# Patient Record
Sex: Female | Born: 1950 | Race: White | Hispanic: No | Marital: Married | Smoking: Never smoker
Health system: Southern US, Community
[De-identification: ages and names within clinical notes are randomized; demographics above are authoritative.]

## PROBLEM LIST (undated history)

## (undated) DIAGNOSIS — F329 Major depressive disorder, single episode, unspecified: Secondary | ICD-10-CM

## (undated) DIAGNOSIS — M7580 Other shoulder lesions, unspecified shoulder: Secondary | ICD-10-CM

## (undated) DIAGNOSIS — F419 Anxiety disorder, unspecified: Secondary | ICD-10-CM

## (undated) DIAGNOSIS — F32A Depression, unspecified: Secondary | ICD-10-CM

## (undated) DIAGNOSIS — M1990 Unspecified osteoarthritis, unspecified site: Secondary | ICD-10-CM

## (undated) DIAGNOSIS — Z961 Presence of intraocular lens: Secondary | ICD-10-CM

## (undated) DIAGNOSIS — I10 Essential (primary) hypertension: Secondary | ICD-10-CM

## (undated) DIAGNOSIS — Z862 Personal history of diseases of the blood and blood-forming organs and certain disorders involving the immune mechanism: Secondary | ICD-10-CM

## (undated) DIAGNOSIS — T7840XA Allergy, unspecified, initial encounter: Secondary | ICD-10-CM

## (undated) DIAGNOSIS — IMO0002 Reserved for concepts with insufficient information to code with codable children: Secondary | ICD-10-CM

## (undated) DIAGNOSIS — E785 Hyperlipidemia, unspecified: Secondary | ICD-10-CM

## (undated) DIAGNOSIS — K219 Gastro-esophageal reflux disease without esophagitis: Secondary | ICD-10-CM

## (undated) DIAGNOSIS — Z8742 Personal history of other diseases of the female genital tract: Secondary | ICD-10-CM

## (undated) DIAGNOSIS — D3192 Benign neoplasm of unspecified part of left eye: Secondary | ICD-10-CM

## (undated) DIAGNOSIS — D649 Anemia, unspecified: Secondary | ICD-10-CM

## (undated) DIAGNOSIS — E049 Nontoxic goiter, unspecified: Secondary | ICD-10-CM

## (undated) DIAGNOSIS — E282 Polycystic ovarian syndrome: Secondary | ICD-10-CM

## (undated) HISTORY — PX: COLONOSCOPY: SHX174

## (undated) HISTORY — DX: Personal history of diseases of the blood and blood-forming organs and certain disorders involving the immune mechanism: Z86.2

## (undated) HISTORY — PX: CATARACT EXTRACTION: SUR2

## (undated) HISTORY — DX: Hyperlipidemia, unspecified: E78.5

## (undated) HISTORY — DX: Polycystic ovarian syndrome: E28.2

## (undated) HISTORY — DX: Benign neoplasm of unspecified part of left eye: D31.92

## (undated) HISTORY — DX: Unspecified osteoarthritis, unspecified site: M19.90

## (undated) HISTORY — DX: Anemia, unspecified: D64.9

## (undated) HISTORY — DX: Personal history of other diseases of the female genital tract: Z87.42

## (undated) HISTORY — DX: Presence of intraocular lens: Z96.1

## (undated) HISTORY — DX: Major depressive disorder, single episode, unspecified: F32.9

## (undated) HISTORY — DX: Reserved for concepts with insufficient information to code with codable children: IMO0002

## (undated) HISTORY — DX: Depression, unspecified: F32.A

## (undated) HISTORY — DX: Allergy, unspecified, initial encounter: T78.40XA

## (undated) HISTORY — DX: Nontoxic goiter, unspecified: E04.9

## (undated) HISTORY — DX: Essential (primary) hypertension: I10

## (undated) HISTORY — DX: Gastro-esophageal reflux disease without esophagitis: K21.9

## (undated) HISTORY — DX: Other shoulder lesions, unspecified shoulder: M75.80

## (undated) HISTORY — DX: Anxiety disorder, unspecified: F41.9

---
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
## 1993-03-06 HISTORY — PX: TUBAL LIGATION: SHX77

## 2001-06-17 ENCOUNTER — Other Ambulatory Visit: Admission: RE | Admit: 2001-06-17 | Discharge: 2001-06-17 | Payer: Self-pay | Admitting: Obstetrics and Gynecology

## 2001-06-26 ENCOUNTER — Ambulatory Visit (HOSPITAL_COMMUNITY): Admission: RE | Admit: 2001-06-26 | Discharge: 2001-06-26 | Payer: Self-pay | Admitting: Obstetrics and Gynecology

## 2001-06-26 ENCOUNTER — Encounter: Payer: Self-pay | Admitting: Obstetrics and Gynecology

## 2002-07-18 ENCOUNTER — Other Ambulatory Visit: Admission: RE | Admit: 2002-07-18 | Discharge: 2002-07-18 | Payer: Self-pay | Admitting: Obstetrics and Gynecology

## 2002-07-30 ENCOUNTER — Ambulatory Visit (HOSPITAL_COMMUNITY): Admission: RE | Admit: 2002-07-30 | Discharge: 2002-07-30 | Payer: Self-pay | Admitting: Obstetrics and Gynecology

## 2002-07-30 ENCOUNTER — Encounter: Payer: Self-pay | Admitting: Obstetrics and Gynecology

## 2003-08-20 ENCOUNTER — Other Ambulatory Visit: Admission: RE | Admit: 2003-08-20 | Discharge: 2003-08-20 | Payer: Self-pay | Admitting: Obstetrics and Gynecology

## 2003-08-26 ENCOUNTER — Ambulatory Visit (HOSPITAL_COMMUNITY): Admission: RE | Admit: 2003-08-26 | Discharge: 2003-08-26 | Payer: Self-pay | Admitting: Obstetrics and Gynecology

## 2004-03-06 HISTORY — PX: TOE SURGERY: SHX1073

## 2004-03-06 HISTORY — PX: BUNIONECTOMY: SHX129

## 2004-03-06 LAB — HM COLONOSCOPY

## 2004-09-08 ENCOUNTER — Other Ambulatory Visit: Admission: RE | Admit: 2004-09-08 | Discharge: 2004-09-08 | Payer: Self-pay | Admitting: *Deleted

## 2004-09-23 ENCOUNTER — Ambulatory Visit (HOSPITAL_COMMUNITY): Admission: RE | Admit: 2004-09-23 | Discharge: 2004-09-23 | Payer: Self-pay | Admitting: Obstetrics and Gynecology

## 2004-10-12 ENCOUNTER — Ambulatory Visit: Payer: Self-pay | Admitting: Gastroenterology

## 2004-10-28 ENCOUNTER — Ambulatory Visit: Payer: Self-pay | Admitting: Gastroenterology

## 2005-02-16 ENCOUNTER — Ambulatory Visit: Payer: Self-pay

## 2005-10-24 ENCOUNTER — Ambulatory Visit (HOSPITAL_COMMUNITY): Admission: RE | Admit: 2005-10-24 | Discharge: 2005-10-24 | Payer: Self-pay | Admitting: Internal Medicine

## 2005-11-02 ENCOUNTER — Other Ambulatory Visit: Admission: RE | Admit: 2005-11-02 | Discharge: 2005-11-02 | Payer: Self-pay | Admitting: Obstetrics & Gynecology

## 2005-11-14 ENCOUNTER — Ambulatory Visit (HOSPITAL_COMMUNITY): Admission: RE | Admit: 2005-11-14 | Discharge: 2005-11-14 | Payer: Self-pay | Admitting: Obstetrics & Gynecology

## 2006-03-15 ENCOUNTER — Other Ambulatory Visit: Admission: RE | Admit: 2006-03-15 | Discharge: 2006-03-15 | Payer: Self-pay | Admitting: Obstetrics & Gynecology

## 2006-11-16 ENCOUNTER — Ambulatory Visit (HOSPITAL_COMMUNITY): Admission: RE | Admit: 2006-11-16 | Discharge: 2006-11-16 | Payer: Self-pay | Admitting: Obstetrics & Gynecology

## 2006-12-03 ENCOUNTER — Other Ambulatory Visit: Admission: RE | Admit: 2006-12-03 | Discharge: 2006-12-03 | Payer: Self-pay | Admitting: Obstetrics & Gynecology

## 2007-03-07 LAB — HM DEXA SCAN

## 2007-05-27 ENCOUNTER — Ambulatory Visit (HOSPITAL_COMMUNITY): Admission: RE | Admit: 2007-05-27 | Discharge: 2007-05-27 | Payer: Self-pay | Admitting: Internal Medicine

## 2007-05-27 ENCOUNTER — Ambulatory Visit: Payer: Self-pay | Admitting: Vascular Surgery

## 2007-05-27 ENCOUNTER — Encounter (INDEPENDENT_AMBULATORY_CARE_PROVIDER_SITE_OTHER): Payer: Self-pay | Admitting: Internal Medicine

## 2007-12-10 ENCOUNTER — Ambulatory Visit (HOSPITAL_COMMUNITY): Admission: RE | Admit: 2007-12-10 | Discharge: 2007-12-10 | Payer: Self-pay | Admitting: Obstetrics & Gynecology

## 2007-12-17 ENCOUNTER — Ambulatory Visit (HOSPITAL_COMMUNITY): Admission: RE | Admit: 2007-12-17 | Discharge: 2007-12-17 | Payer: Self-pay | Admitting: Internal Medicine

## 2008-01-23 ENCOUNTER — Other Ambulatory Visit: Admission: RE | Admit: 2008-01-23 | Discharge: 2008-01-23 | Payer: Self-pay | Admitting: Obstetrics & Gynecology

## 2008-04-29 ENCOUNTER — Ambulatory Visit: Payer: Self-pay | Admitting: Cardiology

## 2008-05-01 ENCOUNTER — Ambulatory Visit: Payer: Self-pay

## 2008-05-22 ENCOUNTER — Encounter: Payer: Self-pay | Admitting: Cardiology

## 2008-05-22 ENCOUNTER — Ambulatory Visit: Payer: Self-pay | Admitting: Cardiology

## 2008-12-10 ENCOUNTER — Ambulatory Visit (HOSPITAL_COMMUNITY): Admission: RE | Admit: 2008-12-10 | Discharge: 2008-12-10 | Payer: Self-pay | Admitting: Obstetrics & Gynecology

## 2009-06-18 ENCOUNTER — Ambulatory Visit (HOSPITAL_COMMUNITY): Admission: RE | Admit: 2009-06-18 | Discharge: 2009-06-18 | Payer: Self-pay | Admitting: Internal Medicine

## 2009-09-10 ENCOUNTER — Encounter: Admission: RE | Admit: 2009-09-10 | Discharge: 2009-09-10 | Payer: Self-pay | Admitting: Internal Medicine

## 2009-12-13 ENCOUNTER — Ambulatory Visit (HOSPITAL_COMMUNITY): Admission: RE | Admit: 2009-12-13 | Discharge: 2009-12-13 | Payer: Self-pay | Admitting: Obstetrics & Gynecology

## 2010-03-27 ENCOUNTER — Encounter: Payer: Self-pay | Admitting: Internal Medicine

## 2010-04-05 NOTE — Assessment & Plan Note (Signed)
   Visit Type:  Follow-up Primary Provider:  Marisue Brooklyn  CC:  no cardiac concerns.  History of Present Illness: See the dictated note from today's visit  Current Medications (verified): 1)  Eql Fish Oil 1000 Mg Caps (Omega-3 Fatty Acids) .... Two Times A Day 2)  Vitamin D 2000 Unit  Tabs (Cholecalciferol) .... 3 Daily 3)  Triple Flex .... Two Times A Day 4)  Calcium D .... Two Times A Day 5)  Cvs Lysine 1000 Mg Tabs (Lysine) .... Once Daily 6)  Crestor 10 Mg Tabs (Rosuvastatin Calcium) .Marland Kitchen.. 1 M,w,f 7)  Lexapro 10 Mg Tabs (Escitalopram Oxalate) .... Once Daily 8)  Benicar 20 Mg Tabs (Olmesartan Medoxomil) .... Take One Tablet By Mouth Daily 9)  Prilosec Otc 20 Mg Tbec (Omeprazole Magnesium) .... Once Daily 10)  Zyrtec Allergy 10 Mg Tabs (Cetirizine Hcl) .... Prn  Allergies (verified): No Known Drug Allergies  Vital Signs:  Patient profile:   60 year old female Height:      66 inches Weight:      164 pounds BMI:     26.57 Pulse rate:   72 / minute BP sitting:   130 / 82

## 2010-07-19 NOTE — Assessment & Plan Note (Signed)
[facility]                            CARDIOLOGY OFFICE NOTE   Christine, [name]                     MRN:          [id number]  DATE:04/29/2008                            DOB:          10-06-1950    [name] is here for the evaluation of chest pain.  She is a very  pleasant active 60 year old female.  She has a strong family history of  coronary disease.  Recently she noticed a type of burning sensation in  her upper chest and in her throat.  It occurred primarily at rest.  However, she was concerned that it may not be indigestion and with her  family history of coronary disease, she explained her situation to Dr.  Elisabeth Most.  An appointment was made to see me for cardiology evaluation.  With the discomfort she does not have nausea, vomiting or diaphoresis.  There is no shortness of breath.  She says that there is a continued  vague dull sensation that she continues to feel.  There is no radiation.   PAST MEDICAL HISTORY:   ALLERGIES:  BACTRIM AND MONISTAT.   MEDICATIONS:  Omega-3 fish oil, vitamins, Triple Flex, calcium, aspirin  81, lysine.  Crestor 20 on Monday, Wednesday, and Friday.  Lexapro 15 at  night.  Benicar 20 and over-the-counter Prilosec that she has been  taking on her own recently.   OTHER MEDICAL PROBLEMS:  See the list below.   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  She does not smoke and has never  smoked.   FAMILY HISTORY:  The patient's father is deceased.  He had significant  coronary disease.  She has a 73 year old brother who recently had a  coronary procedure (probably a stent).   REVIEW OF SYSTEMS:  She is not having any GU symptoms at this time.  She  has no fevers or chills.  There are no skin rashes.  She has had some  vague headaches.  She has no dental problems.  There  is no shortness of  breath.  Otherwise review of systems is negative.   PHYSICAL EXAM:  Blood pressure is 132/74.  Her pulse is 82.  Weight is  164 pounds.  The patient is oriented to person, time and place.  Affect  is normal.  HEENT:  Reveals no xanthelasma.  She has normal extraocular motion.  There are no carotid bruits.  There is no jugular venous distention.  Lungs are clear.  Respiratory effort is not labored.  CARDIAC:  Exam reveals an S1 with an S2.  There are no clicks or  significant murmurs.  The abdomen is soft.  She has no significant peripheral edema.   EKG is normal.   PROBLEMS:  Include:  1. A history of tubal ligation.  2. A history of cataract surgery.  3. A history of some menstrual dysfunction historically.  4. A strong family history of coronary artery disease.  5. Hypercholesterolemia.  6. Hypertension.  7. Possible gastroesophageal reflux disease.  8. Recent* chest discomfort of unknown etiology.  At this point there      is no definite evidence of significant ischemic disease.  However,      with her symptoms and her strong family history, it is appropriate      to proceed with a stress Myoview scan.  This will be arranged and      we will give her early feedback on the results and then I will see      her back for cardiology followup.   During evaluation she asked me specifically about Benicar and its  potential side effects.  We discussed this and I told her I felt very  comfortable that this was an excellent medicine for her.   I will see her back for followup after her study.     Luis Abed, MD, [facility]  Electronically Signed    JDK/MedQ  DD: 04/29/2008  DT: 04/29/2008  Job #: [id number]   cc:   Lovenia Kim, D.O.

## 2010-07-19 NOTE — Assessment & Plan Note (Signed)
[facility]                            CARDIOLOGY OFFICE NOTE   RAEANN, OFFNER                     MRN:          [id number]  DATE:05/22/2008                            DOB:          May 03, 1950    Ms. Mcqueen is seen for followup.  I had seen her in consultation on  April 29, 2008.  We did a stress Myoview scan.  She exercised well.  There was no chest pain.  There was no EKG change.  The wall motion was  normal.  There was no sign of ischemia.   In seeing her back and talking with her today she clearly has  significant symptoms of GERD.  I encouraged her to continue to take her  Prilosec and to increase it to twice a day.  I encouraged her to elevate  the head of her bed and to be careful about not lying down after she  eats.  Also some weight loss will be helpful.  She will follow up with  Dr. Elisabeth Most.  No further cardiac workup is needed.   PAST MEDICAL HISTORY:  Allergies to BACTRIM and MONISTAT.   MEDICATIONS:  See the prior note.   OTHER MEDICAL PROBLEMS:  See the complete list on my note of April 29, 2008.   REVIEW OF SYSTEMS:  She is not having any headache.  There are no fevers  or chills.  There are no major musculoskeletal problems.  As mentioned  in the HPI, GERD appears to be a significant issue.  She is not having  any GU problems.  Otherwise, review of systems is negative.   Physical examination today was limited.  The majority of the visit was  discussing her overall status.  Blood pressure is 130/82, her weight is  164 pounds.   Problems are listed on the note of April 29, 2008.  With the family  history of coronary disease of course careful attention will have to be  paid to her lipids over time and very good primary prevention.  I do not  believe her current symptoms are cardiac.  Further cardiac workup is not  needed at this time.     Luis Abed, MD, [facility]  Electronically Signed    JDK/MedQ   DD: 05/22/2008  DT: 05/23/2008  Job #: [id number]   cc:   Lovenia Kim, D.O.

## 2010-11-15 ENCOUNTER — Other Ambulatory Visit: Payer: Self-pay | Admitting: Obstetrics & Gynecology

## 2010-11-15 DIAGNOSIS — Z1231 Encounter for screening mammogram for malignant neoplasm of breast: Secondary | ICD-10-CM

## 2010-12-15 ENCOUNTER — Ambulatory Visit (HOSPITAL_COMMUNITY)
Admission: RE | Admit: 2010-12-15 | Discharge: 2010-12-15 | Disposition: A | Discharge status: Home / Self Care | Payer: 59 | Source: Ambulatory Visit | Attending: Obstetrics & Gynecology | Admitting: Obstetrics & Gynecology

## 2010-12-15 DIAGNOSIS — Z1231 Encounter for screening mammogram for malignant neoplasm of breast: Secondary | ICD-10-CM

## 2011-04-27 LAB — HM PAP SMEAR: HM Pap smear: NEGATIVE

## 2011-06-13 DIAGNOSIS — Z961 Presence of intraocular lens: Secondary | ICD-10-CM | POA: Insufficient documentation

## 2011-06-13 DIAGNOSIS — IMO0002 Reserved for concepts with insufficient information to code with codable children: Secondary | ICD-10-CM

## 2011-06-13 HISTORY — DX: Reserved for concepts with insufficient information to code with codable children: IMO0002

## 2011-06-13 HISTORY — DX: Presence of intraocular lens: Z96.1

## 2011-11-14 ENCOUNTER — Other Ambulatory Visit: Payer: Self-pay | Admitting: Obstetrics & Gynecology

## 2011-11-14 DIAGNOSIS — Z1231 Encounter for screening mammogram for malignant neoplasm of breast: Secondary | ICD-10-CM

## 2011-12-13 ENCOUNTER — Other Ambulatory Visit: Payer: Self-pay | Admitting: Internal Medicine

## 2011-12-13 DIAGNOSIS — R221 Localized swelling, mass and lump, neck: Secondary | ICD-10-CM

## 2011-12-18 ENCOUNTER — Ambulatory Visit
Admission: RE | Admit: 2011-12-18 | Discharge: 2011-12-18 | Disposition: A | Discharge status: Home / Self Care | Payer: 59 | Source: Ambulatory Visit | Attending: Internal Medicine | Admitting: Internal Medicine

## 2011-12-18 DIAGNOSIS — R221 Localized swelling, mass and lump, neck: Secondary | ICD-10-CM

## 2011-12-28 ENCOUNTER — Ambulatory Visit (HOSPITAL_COMMUNITY)
Admission: RE | Admit: 2011-12-28 | Discharge: 2011-12-28 | Disposition: A | Discharge status: Home / Self Care | Payer: 59 | Source: Ambulatory Visit | Attending: Obstetrics & Gynecology | Admitting: Obstetrics & Gynecology

## 2011-12-28 DIAGNOSIS — Z1231 Encounter for screening mammogram for malignant neoplasm of breast: Secondary | ICD-10-CM | POA: Insufficient documentation

## 2011-12-28 LAB — HM MAMMOGRAPHY

## 2012-03-26 ENCOUNTER — Encounter: Payer: Self-pay | Admitting: *Deleted

## 2012-05-21 ENCOUNTER — Encounter: Payer: Self-pay | Admitting: Obstetrics & Gynecology

## 2012-05-21 ENCOUNTER — Ambulatory Visit (INDEPENDENT_AMBULATORY_CARE_PROVIDER_SITE_OTHER): Payer: 59 | Admitting: Obstetrics & Gynecology

## 2012-05-21 VITALS — BP 134/82 | Ht 66.0 in | Wt 142.2 lb

## 2012-05-21 DIAGNOSIS — Z01419 Encounter for gynecological examination (general) (routine) without abnormal findings: Secondary | ICD-10-CM

## 2012-05-21 DIAGNOSIS — N952 Postmenopausal atrophic vaginitis: Secondary | ICD-10-CM

## 2012-05-21 MED ORDER — ESTRADIOL 0.1 MG/GM VA CREA: TOPICAL_CREAM | VAGINAL | Status: DC

## 2012-05-21 MED ORDER — ESTRADIOL 10 MCG VA TABS: 1.0000 | ORAL_TABLET | VAGINAL | Status: DC

## 2012-05-21 NOTE — Progress Notes (Signed)
61 y.o. MarriedCaucasian female [phone] here for annual exam.  Reports she's being followed more closely this year by PCP for "pre-diabetes".  HBA1C 5.9.  Also diagnosed with multinodular goiter.  She had an ultrasound and will have follow-up later this year.    Patient's last menstrual period was 12/05/2010.          Sexually active: yes  The current method of family planning is tubal ligation.    Exercising: yes  Gym/ health club routine includes swimming. Hormones:Estrogen: vagifem 10 mcg twice weekly   reports that she has never smoked. She has never used smokeless tobacco. She reports that  drinks alcohol. She reports that she does not use illicit drugs.  Health Maintenance  Topic Date Due  . Influenza Vaccine  11/04/1951  . Pap Smear  12/04/1969  . Tetanus/tdap  12/05/1970  . Colonoscopy  12/04/2001  . Zostavax  12/05/2011  . Mammogram  12/27/2013  last Pap was 2/13 neg and HR HPV was also negative Also started hep A/B series.  Has one left.     Family History  Problem Relation Age of Onset  . Thyroid disease Mother     hyperthyroidism    There is no problem list on file for this patient.   Past Medical History  Diagnosis Date  . Degenerative arthritis   . PCOS (polycystic ovarian syndrome)   . AC (acromioclavicular) joint bone spurs   . Hypertension   . Goiter     multi nodular   . History of PCOS     Past Surgical History  Procedure Laterality Date  . Tubal ligation      Allergies: Bactrim and Monistat  Current Outpatient Prescriptions  Medication Sig Dispense Refill  . amLODipine (NORVASC) 5 MG tablet Take 2.5 mg by mouth daily.       . B Complex Vitamins (VITAMIN B COMPLEX PO) Take by mouth.      . B COMPLEX-BIOTIN-FA PO Take by mouth.      . Cetirizine HCl (ZYRTEC ALLERGY PO) Take by mouth.      . Estradiol (VAGIFEM) 10 MCG TABS Place vaginally 2 (two) times a week.      Marland Kitchen GLUCOSAMINE-CHONDROITIN PO Take by mouth.      . GuaiFENesin (MUCINEX PO)  Take by mouth as needed.      Marland Kitchen MAGNESIUM PO Take by mouth.      . Omega-3 Fatty Acids (FISH OIL PO) Take by mouth.      . Red Yeast Rice Extract (RED YEAST RICE PO) Take by mouth.      . Rizatriptan Benzoate (MAXALT PO) Take by mouth as needed.      . rosuvastatin (CRESTOR) 20 MG tablet Take 20 mg by mouth 3 (three) times a week.      . spironolactone (ALDACTONE) 100 MG tablet Take 200 mg by mouth daily.      . ValACYclovir HCl (VALTREX PO) Take by mouth as needed.      . Vitamin D, Ergocalciferol, (DRISDOL) 50000 UNITS CAPS Take 5,000 Units by mouth daily.        No current facility-administered medications for this visit.    ROS: Pertinent items are noted in HPI.  Exam:    BP 134/82  Ht 5\' 6"  (1.676 m)  Wt 142 lb 3.2 oz (64.501 kg)  BMI 22.96 kg/m2  LMP 12/05/2010  Height:   Height: 5\' 6"  (167.6 cm)   General appearance: alert, cooperative and appears stated age Head: Normocephalic, without  obvious abnormality, atraumatic Neck: no adenopathy, supple, symmetrical, trachea midline and thyroid mildly enlarged, R>L lobe, no palpable nodules Lungs: clear to auscultation bilaterally Breasts: Inspection negative, No nipple retraction or dimpling, No nipple discharge or bleeding, No axillary or supraclavicular adenopathy, Normal to palpation without dominant masses Heart: regular rate and rhythm Abdomen: soft, non-tender; bowel sounds normal; no masses,  no organomegaly Extremities: extremities normal, atraumatic, no cyanosis or edema Skin: Skin color, texture, turgor normal. No rashes or lesions Lymph nodes: Cervical, supraclavicular, and axillary nodes normal. No abnormal inguinal nodes palpated Neurologic: Grossly normal   Pelvic: External genitalia:  no lesions              Urethra:  normal appearing urethra with no masses, tenderness or lesions              Bartholins and Skenes: normal                 Vagina: normal appearing vagina with normal color and discharge, no  lesions              Cervix: normal appearance              Pap taken: no        Bimanual Exam:  Uterus:  uterus is normal size, shape, consistency and nontender                                      Adnexa: normal adnexa in size, nontender and no masses                                      Rectovaginal: Confirms                                      Anus:  normal sphincter tone, no lesions  A: well woman Vaginal atrophic changes PCOS Elevated lipids  P: AEX 1 year Vagifem pv twice weekly.  Mail order RX given.  RX also given for estrace cream Labs with PCP q 4 months She will check with PCP regarding TDAP--if due.       An After Visit Summary was printed and given to the patient.

## 2012-07-05 ENCOUNTER — Other Ambulatory Visit: Payer: Self-pay | Admitting: Internal Medicine

## 2012-07-05 DIAGNOSIS — E042 Nontoxic multinodular goiter: Secondary | ICD-10-CM

## 2012-07-10 ENCOUNTER — Ambulatory Visit
Admission: RE | Admit: 2012-07-10 | Discharge: 2012-07-10 | Disposition: A | Discharge status: Home / Self Care | Payer: 59 | Source: Ambulatory Visit | Attending: Internal Medicine | Admitting: Internal Medicine

## 2012-07-10 DIAGNOSIS — E042 Nontoxic multinodular goiter: Secondary | ICD-10-CM

## 2012-10-10 ENCOUNTER — Other Ambulatory Visit: Payer: Self-pay | Admitting: Internal Medicine

## 2012-10-10 DIAGNOSIS — R102 Pelvic and perineal pain: Secondary | ICD-10-CM

## 2012-10-14 ENCOUNTER — Ambulatory Visit
Admission: RE | Admit: 2012-10-14 | Discharge: 2012-10-14 | Disposition: A | Discharge status: Home / Self Care | Payer: 59 | Source: Ambulatory Visit | Attending: Internal Medicine | Admitting: Internal Medicine

## 2012-10-14 DIAGNOSIS — R102 Pelvic and perineal pain: Secondary | ICD-10-CM

## 2012-12-10 ENCOUNTER — Other Ambulatory Visit: Payer: Self-pay | Admitting: Obstetrics & Gynecology

## 2012-12-10 DIAGNOSIS — Z1231 Encounter for screening mammogram for malignant neoplasm of breast: Secondary | ICD-10-CM

## 2012-12-16 ENCOUNTER — Other Ambulatory Visit (HOSPITAL_COMMUNITY): Payer: Self-pay | Admitting: Emergency Medicine

## 2012-12-16 ENCOUNTER — Other Ambulatory Visit: Payer: Self-pay | Admitting: Obstetrics & Gynecology

## 2012-12-16 ENCOUNTER — Ambulatory Visit (HOSPITAL_COMMUNITY)
Admission: RE | Admit: 2012-12-16 | Discharge: 2012-12-16 | Disposition: A | Discharge status: Home / Self Care | Payer: 59 | Source: Ambulatory Visit | Attending: Emergency Medicine | Admitting: Emergency Medicine

## 2012-12-16 DIAGNOSIS — M545 Low back pain, unspecified: Secondary | ICD-10-CM | POA: Insufficient documentation

## 2012-12-16 DIAGNOSIS — M549 Dorsalgia, unspecified: Secondary | ICD-10-CM

## 2012-12-16 DIAGNOSIS — R911 Solitary pulmonary nodule: Secondary | ICD-10-CM | POA: Insufficient documentation

## 2012-12-16 DIAGNOSIS — IMO0002 Reserved for concepts with insufficient information to code with codable children: Secondary | ICD-10-CM | POA: Insufficient documentation

## 2013-01-01 ENCOUNTER — Ambulatory Visit (HOSPITAL_COMMUNITY)
Admission: RE | Admit: 2013-01-01 | Discharge: 2013-01-01 | Disposition: A | Discharge status: Home / Self Care | Payer: 59 | Source: Ambulatory Visit | Attending: Obstetrics & Gynecology | Admitting: Obstetrics & Gynecology

## 2013-01-01 DIAGNOSIS — Z1231 Encounter for screening mammogram for malignant neoplasm of breast: Secondary | ICD-10-CM

## 2013-02-07 ENCOUNTER — Encounter: Payer: Self-pay | Admitting: Physician Assistant

## 2013-02-07 ENCOUNTER — Ambulatory Visit (INDEPENDENT_AMBULATORY_CARE_PROVIDER_SITE_OTHER): Payer: 59 | Admitting: Physician Assistant

## 2013-02-07 VITALS — BP 112/72 | HR 64 | Temp 98.1°F | Resp 16 | Ht 66.0 in | Wt 144.0 lb

## 2013-02-07 DIAGNOSIS — J019 Acute sinusitis, unspecified: Secondary | ICD-10-CM

## 2013-02-07 MED ORDER — PREDNISONE 20 MG PO TABS: ORAL_TABLET | ORAL | Status: DC

## 2013-02-07 MED ORDER — LEVOFLOXACIN 500 MG PO TABS: 500.0000 mg | ORAL_TABLET | Freq: Every day | ORAL | Status: DC

## 2013-02-07 NOTE — Patient Instructions (Signed)

## 2013-02-07 NOTE — Progress Notes (Signed)
Subjective:    Patient ID: Christine [name], female    DOB: 02/17/1951, 62 y.o.   MRN: [id number]  Sinus Problem This is a new problem. The current episode started 1 to 4 weeks ago. The problem has been gradually worsening since onset. There has been no fever. The pain is mild. Associated symptoms include congestion, coughing, ear pain (right), a hoarse voice and sinus pressure. Pertinent negatives include no chills, diaphoresis, neck pain, shortness of breath, sneezing, sore throat or swollen glands. Past treatments include acetaminophen and oral decongestants (mucinex, zyrtec). The treatment provided no relief.   Current Outpatient Prescriptions on File Prior to Visit  Medication Sig Dispense Refill  . amLODipine (NORVASC) 5 MG tablet Take 2.5 mg by mouth daily.       . B Complex Vitamins (VITAMIN B COMPLEX PO) Take by mouth.      . B COMPLEX-BIOTIN-FA PO Take by mouth.      . Cetirizine HCl (ZYRTEC ALLERGY PO) Take by mouth.      . estradiol (ESTRACE) 0.1 MG/GM vaginal cream Apply externally as directed  42.5 g  0  . Estradiol (VAGIFEM) 10 MCG TABS Place 1 tablet (10 mcg total) vaginally 2 (two) times a week.  24 tablet  3  . GLUCOSAMINE-CHONDROITIN PO Take by mouth.      . GuaiFENesin (MUCINEX PO) Take by mouth as needed.      Marland Kitchen MAGNESIUM PO Take by mouth.      . Omega-3 Fatty Acids (FISH OIL PO) Take by mouth.      . Red Yeast Rice Extract (RED YEAST RICE PO) Take by mouth.      . Rizatriptan Benzoate (MAXALT PO) Take by mouth as needed.      . rosuvastatin (CRESTOR) 20 MG tablet Take 20 mg by mouth 3 (three) times a week.      . spironolactone (ALDACTONE) 100 MG tablet Take 200 mg by mouth daily.      . ValACYclovir HCl (VALTREX PO) Take by mouth as needed.      . Vitamin D, Ergocalciferol, (DRISDOL) 50000 UNITS CAPS Take 5,000 Units by mouth daily.        No current facility-administered medications on file prior to visit.   Past Medical History  Diagnosis Date  . Degenerative  arthritis   . PCOS (polycystic ovarian syndrome)   . AC (acromioclavicular) joint bone spurs   . Hypertension   . Goiter     multi nodular   . History of PCOS     Review of Systems  Constitutional: Positive for fatigue. Negative for fever, chills and diaphoresis.  HENT: Positive for congestion, dental problem, ear pain (right), hoarse voice, rhinorrhea and sinus pressure. Negative for sneezing and sore throat.   Eyes: Negative.   Respiratory: Positive for cough. Negative for chest tightness, shortness of breath and wheezing.   Cardiovascular: Negative.   Gastrointestinal: Negative.   Musculoskeletal: Negative for neck pain.  Neurological: Negative.       Objective:   Physical Exam  Constitutional: She appears well-developed and well-nourished.  HENT:  Head: Normocephalic and atraumatic.  Right Ear: External ear normal.  Left Ear: Hearing and external ear normal. Tympanic membrane is injected, erythematous and retracted.  Eyes: Conjunctivae and EOM are normal. Pupils are equal, round, and reactive to light.  Neck: Normal range of motion. Neck supple.  Cardiovascular: Normal rate, regular rhythm, normal heart sounds and intact distal pulses.   Pulmonary/Chest: Effort normal and breath sounds normal.  No respiratory distress. She has no wheezes.  Abdominal: Soft. Bowel sounds are normal.  Lymphadenopathy:    She has cervical adenopathy.  Skin: Skin is warm and dry.      Assessment & Plan:  Acute sinusitis, unspecified - Plan: levofloxacin (LEVAQUIN) 500 MG tablet, predniSONE (DELTASONE) 20 MG tablet

## 2013-03-13 ENCOUNTER — Encounter: Payer: Self-pay | Admitting: Internal Medicine

## 2013-03-16 DIAGNOSIS — R7303 Prediabetes: Secondary | ICD-10-CM | POA: Insufficient documentation

## 2013-03-16 DIAGNOSIS — E782 Mixed hyperlipidemia: Secondary | ICD-10-CM | POA: Insufficient documentation

## 2013-03-16 DIAGNOSIS — I10 Essential (primary) hypertension: Secondary | ICD-10-CM | POA: Insufficient documentation

## 2013-03-16 DIAGNOSIS — E559 Vitamin D deficiency, unspecified: Secondary | ICD-10-CM | POA: Insufficient documentation

## 2013-03-16 NOTE — Progress Notes (Signed)
Patient ID: Christine [name], female   DOB: 04-Sep-1950, 63 y.o.   MRN: [id number]   This very nice 63 y.o. MWF presents for 3 month follow up with Hypertension, Hyperlipidemia, Pre-Diabetes and Vitamin D Deficiency.    HTN predates since 04/2008. She was initially tx'd with benicar with a question of hair loss and was switched to Amlodipine 1/2 tab of 5 mg. BP has been occasionally elevated to the 150-160's which she relates to job stress. Today's BP: 120/76 mmHg. Patient denies any cardiac type chest pain, palpitations, dyspnea/orthopnea/PND, dizziness, claudication, or dependent edema.  In June 2010 she had a normal cardiolyte with EF 76 %. She exercises by swimming about 3 x wk. She does have Hx/o Migraines which have been infrequent in the last year. She has occasional HB secondary to Ibuprofen.   Hyperlipidemia is treated with diet & meds. Last Cholesterol was 210, Triglycerides were  208, HDL 61 and LDL 107 - near goal in Nov 2014. Currently, she takes her Crestor 40 mg 5 da/wk.Patient denies myalgias or other med SE's.    Also, the patient has history of PreDiabetes with last A1c of 5.8 % in Oct 2014. Patient denies any symptoms of reactive hypoglycemia, diabetic polys, paresthesias or visual blurring.   Further, Patient has history of Vitamin D Deficiency of 20 in 2008 and with last vitamin D of 58 in Oct. Patient supplements vitamin D without any suspected side-effects.    Medication List       This list is accurate as of: 03/17/13  8:11 PM.  Always use your most recent med list.               amLODipine 5 MG tablet  Commonly known as:  NORVASC  Take 1 tablet (5 mg total) by mouth daily.     B COMPLEX-BIOTIN-FA PO  Take by mouth.     estradiol 0.1 MG/GM vaginal cream  Commonly known as:  ESTRACE  Apply externally as directed     Estradiol 10 MCG Tabs vaginal tablet  Commonly known as:  VAGIFEM  Place 1 tablet (10 mcg total) vaginally 2 (two) times a week.     FISH OIL PO   Take 1,200 mg by mouth.     GLUCOSAMINE-CHONDROITIN PO  Take by mouth.     MAGNESIUM PO  Take by mouth.     MAXALT PO  Take by mouth as needed.     meloxicam 15 MG tablet  Commonly known as:  MOBIC     RED YEAST RICE PO  Take by mouth.     rosuvastatin 40 MG tablet  Commonly known as:  CRESTOR  Take 1 tablet (40 mg total) by mouth daily.     spironolactone 100 MG tablet  Commonly known as:  ALDACTONE  Take 200 mg by mouth daily.     VALTREX PO  Take by mouth as needed.     VITAMIN B COMPLEX PO  Take by mouth.     VITAMIN D PO  Take 5,000 Units by mouth daily.     ZYRTEC ALLERGY PO  Take by mouth.         Allergies  Allergen Reactions  . Bactrim [Sulfamethoxazole-Tmp Ds] Itching  . Lipitor [Atorvastatin] Other (See Comments)    headache  . Monistat [Miconazole] Itching    PMHx:   Past Medical History  Diagnosis Date  . Degenerative arthritis   . PCOS (polycystic ovarian syndrome)   . AC (acromioclavicular) joint bone spurs   .  Hypertension   . Goiter     multi nodular   . History of PCOS   . Hyperlipidemia   . Anemia   . Anxiety   . Depression   . GERD (gastroesophageal reflux disease)   . Nevus of left eye     FHx:    Reviewed / unchanged  SHx:    Reviewed / unchanged  Systems Review: Constitutional: Denies fever, chills, wt changes, headaches, insomnia, fatigue, night sweats, change in appetite. Eyes: Denies redness, blurred vision, diplopia, discharge, itchy, watery eyes. She is being followed by Dr's Sunday Shams for a retinal Nevus. ENT: Denies discharge, congestion, post nasal drip, epistaxis, sore throat, earache, hearing loss, dental pain, tinnitus, vertigo, sinus pain, snoring.  CV: Denies chest pain, palpitations, irregular heartbeat, syncope, dyspnea, diaphoresis, orthopnea, PND, claudication, edema. Respiratory: denies cough, dyspnea, DOE, pleurisy, hoarseness, laryngitis, wheezing.  Gastrointestinal: Denies dysphagia,  odynophagia, heartburn, reflux, water brash, abdominal pain or cramps, nausea, vomiting, bloating, diarrhea, constipation, hematemesis, melena, hematochezia,  or hemorrhoids. Genitourinary: Denies dysuria, frequency, urgency, nocturia, hesitancy, discharge, hematuria, flank pain. Musculoskeletal: Denies arthralgias, myalgias, stiffness, jt. swelling, pain, limp, strain/sprain.  Skin: Denies pruritus, rash, hives, warts, acne, eczema, change in skin lesion(s). Neuro: No weakness, tremor, incoordination, spasms, paresthesia, or pain. Psychiatric: Denies confusion, memory loss, or sensory loss. Endo: Denies change in weight, skin, hair change.  Heme/Lymph: No excessive bleeding, bruising, orenlarged lymph nodes.  Filed Vitals:   03/17/13 0933  BP: 120/76  Pulse: 76  Temp: 98.1 F (36.7 C)  Resp: 16    Estimated body mass index is 23.19 kg/(m^2) as calculated from the following:   Height as of 02/07/13: 5\' 6"  (1.676 m).   Weight as of this encounter: 143 lb 9.6 oz (65.137 kg).  On Exam: Appears well nourished - in no distress. Eyes: PERRLA, EOMs, conjunctiva no swelling or erythema. Sinuses: No frontal/maxillary tenderness ENT/Mouth: EAC's clear, TM's nl w/o erythema, bulging. Nares clear w/o erythema, swelling, exudates. Oropharynx clear without erythema or exudates. Oral hygiene is good. Tongue normal, non obstructing. Hearing intact.  Neck: Supple. Thyroid nl. Car 2+/2+ without bruits, nodes or JVD. Chest: Respirations nl with BS clear & equal w/o rales, rhonchi, wheezing or stridor.  Cor: Heart sounds normal w/ regular rate and rhythm without sig. murmurs, gallops, clicks, or rubs. Peripheral pulses normal and equal  without edema.  Abdomen: Soft & bowel sounds normal. Non-tender w/o guarding, rebound, hernias, masses, or organomegaly.  Lymphatics: Unremarkable.  Musculoskeletal: Full ROM all peripheral extremities, joint stability, 5/5 strength, and normal gait.  Skin: Warm, dry  without exposed rashes, lesions, ecchymosis apparent.  Neuro: Cranial nerves intact, reflexes equal bilaterally. Sensory-motor testing grossly intact. Tendon reflexes grossly intact.  Pysch: Alert & oriented x 3. Insight and judgement nl & appropriate. No ideations.  Assessment and Plan:  1. Hypertension - Continue monitor blood pressure at home. Continue diet/meds same.  2. Hyperlipidemia - Continue diet/meds, exercise,& lifestyle modifications. Continue monitor periodic cholesterol/liver & renal functions   3. Pre-diabetes - Continue diet, exercise, lifestyle modifications. Monitor appropriate labs.  4. Vitamin D Deficiency - Continue supplementation.  Recommended regular exercise, BP monitoring, weight control, and discussed med and SE's. Recommended labs to assess and monitor clinical status. Further disposition pending results of labs.

## 2013-03-17 ENCOUNTER — Encounter: Payer: Self-pay | Admitting: Internal Medicine

## 2013-03-17 ENCOUNTER — Ambulatory Visit (INDEPENDENT_AMBULATORY_CARE_PROVIDER_SITE_OTHER): Payer: 59 | Admitting: Internal Medicine

## 2013-03-17 VITALS — BP 120/76 | HR 76 | Temp 98.1°F | Resp 16 | Wt 143.6 lb

## 2013-03-17 DIAGNOSIS — I10 Essential (primary) hypertension: Secondary | ICD-10-CM

## 2013-03-17 DIAGNOSIS — Z79899 Other long term (current) drug therapy: Secondary | ICD-10-CM

## 2013-03-17 DIAGNOSIS — E782 Mixed hyperlipidemia: Secondary | ICD-10-CM

## 2013-03-17 DIAGNOSIS — E559 Vitamin D deficiency, unspecified: Secondary | ICD-10-CM

## 2013-03-17 DIAGNOSIS — R7309 Other abnormal glucose: Secondary | ICD-10-CM

## 2013-03-17 LAB — HEPATIC FUNCTION PANEL
ALT: 26 U/L (ref 0–35)
AST: 21 U/L (ref 0–37)
Albumin: 4.8 g/dL (ref 3.5–5.2)
Alkaline Phosphatase: 91 U/L (ref 39–117)
Bilirubin, Direct: 0.1 mg/dL (ref 0.0–0.3)
Indirect Bilirubin: 0.4 mg/dL (ref 0.0–0.9)
Total Bilirubin: 0.5 mg/dL (ref 0.3–1.2)
Total Protein: 6.6 g/dL (ref 6.0–8.3)

## 2013-03-17 LAB — CBC WITH DIFFERENTIAL/PLATELET
Basophils Absolute: 0.1 10*3/uL (ref 0.0–0.1)
Basophils Relative: 1 % (ref 0–1)
Eosinophils Absolute: 0.1 10*3/uL (ref 0.0–0.7)
Eosinophils Relative: 2 % (ref 0–5)
HCT: 43.1 % (ref 36.0–46.0)
Hemoglobin: 14.9 g/dL (ref 12.0–15.0)
Lymphocytes Relative: 26 % (ref 12–46)
Lymphs Abs: 1.4 10*3/uL (ref 0.7–4.0)
MCH: 31.4 pg (ref 26.0–34.0)
MCHC: 34.6 g/dL (ref 30.0–36.0)
MCV: 90.9 fL (ref 78.0–100.0)
Monocytes Absolute: 0.3 10*3/uL (ref 0.1–1.0)
Monocytes Relative: 5 % (ref 3–12)
Neutro Abs: 3.5 10*3/uL (ref 1.7–7.7)
Neutrophils Relative %: 66 % (ref 43–77)
Platelets: 295 10*3/uL (ref 150–400)
RBC: 4.74 MIL/uL (ref 3.87–5.11)
RDW: 12.8 % (ref 11.5–15.5)
WBC: 5.3 10*3/uL (ref 4.0–10.5)

## 2013-03-17 LAB — LIPID PANEL
Cholesterol: 190 mg/dL (ref 0–200)
HDL: 59 mg/dL (ref >39)
LDL Cholesterol: 97 mg/dL (ref 0–99)
Total CHOL/HDL Ratio: 3.2 Ratio
Triglycerides: 168 mg/dL — ABNORMAL HIGH (ref <150)
VLDL: 34 mg/dL (ref 0–40)

## 2013-03-17 LAB — BASIC METABOLIC PANEL WITH GFR
BUN: 14 mg/dL (ref 6–23)
CO2: 30 mEq/L (ref 19–32)
Calcium: 9.9 mg/dL (ref 8.4–10.5)
Chloride: 101 mEq/L (ref 96–112)
Creat: 0.83 mg/dL (ref 0.50–1.10)
GFR, Est African American: 88 mL/min
GFR, Est Non African American: 76 mL/min
Glucose, Bld: 96 mg/dL (ref 70–99)
Potassium: 5.3 mEq/L (ref 3.5–5.3)
Sodium: 140 mEq/L (ref 135–145)

## 2013-03-17 LAB — HEMOGLOBIN A1C
Hgb A1c MFr Bld: 5.8 % — ABNORMAL HIGH (ref <5.7)
Mean Plasma Glucose: 120 mg/dL — ABNORMAL HIGH (ref <117)

## 2013-03-17 LAB — MAGNESIUM: Magnesium: 1.8 mg/dL (ref 1.5–2.5)

## 2013-03-17 LAB — TSH: TSH: 1.559 u[IU]/mL (ref 0.350–4.500)

## 2013-03-17 MED ORDER — AMLODIPINE BESYLATE 5 MG PO TABS: 5.0000 mg | ORAL_TABLET | Freq: Every day | ORAL | Status: DC

## 2013-03-17 MED ORDER — ROSUVASTATIN CALCIUM 40 MG PO TABS: 40.0000 mg | ORAL_TABLET | Freq: Every day | ORAL | Status: DC

## 2013-03-17 NOTE — Patient Instructions (Signed)
Hypertension As your heart beats, it forces blood through your arteries. This force is your blood pressure. If the pressure is too high, it is called hypertension (HTN) or high blood pressure. HTN is dangerous because you may have it and not know it. High blood pressure may mean that your heart has to work harder to pump blood. Your arteries may be narrow or stiff. The extra work puts you at risk for heart disease, stroke, and other problems.  Blood pressure consists of two numbers, a higher number over a lower, 110/72, for example. It is stated as "110 over 72." The ideal is below 120 for the top number (systolic) and under 80 for the bottom (diastolic). Write down your blood pressure today. You should pay close attention to your blood pressure if you have certain conditions such as:  Heart failure.  Prior heart attack.  Diabetes  Chronic kidney disease.  Prior stroke.  Multiple risk factors for heart disease. To see if you have HTN, your blood pressure should be measured while you are seated with your arm held at the level of the heart. It should be measured at least twice. A one-time elevated blood pressure reading (especially in the Emergency Department) does not mean that you need treatment. There may be conditions in which the blood pressure is different between your right and left arms. It is important to see your caregiver soon for a recheck. Most people have essential hypertension which means that there is not a specific cause. This type of high blood pressure may be lowered by changing lifestyle factors such as:  Stress.  Smoking.  Lack of exercise.  Excessive weight.  Drug/tobacco/alcohol use.  Eating less salt. Most people do not have symptoms from high blood pressure until it has caused damage to the body. Effective treatment can often prevent, delay or reduce that damage. TREATMENT  When a cause has been identified, treatment for high blood pressure is directed at the  cause. There are a large number of medications to treat HTN. These fall into several categories, and your caregiver will help you select the medicines that are best for you. Medications may have side effects. You should review side effects with your caregiver. If your blood pressure stays high after you have made lifestyle changes or started on medicines,   Your medication(s) may need to be changed.  Other problems may need to be addressed.  Be certain you understand your prescriptions, and know how and when to take your medicine.  Be sure to follow up with your caregiver within the time frame advised (usually within two weeks) to have your blood pressure rechecked and to review your medications.  If you are taking more than one medicine to lower your blood pressure, make sure you know how and at what times they should be taken. Taking two medicines at the same time can result in blood pressure that is too low. SEEK IMMEDIATE MEDICAL CARE IF:  You develop a severe headache, blurred or changing vision, or confusion.  You have unusual weakness or numbness, or a faint feeling.  You have severe chest or abdominal pain, vomiting, or breathing problems. MAKE SURE YOU:   Understand these instructions.  Will watch your condition.  Will get help right away if you are not doing well or get worse. Document Released: 02/20/2005 Document Revised: 05/15/2011 Document Reviewed: 10/11/2007 [employer] Patient Information 2014 [employer].  Diabetes and Exercise Exercising regularly is important. It is not just about losing weight. It   has many health benefits, such as:  Improving your overall fitness, flexibility, and endurance.  Increasing your bone density.  Helping with weight control.  Decreasing your body fat.  Increasing your muscle strength.  Reducing stress and tension.  Improving your overall health. People with diabetes who exercise gain additional benefits because  exercise:  Reduces appetite.  Improves the body's use of blood sugar (glucose).  Helps lower or control blood glucose.  Decreases blood pressure.  Helps control blood lipids (such as cholesterol and triglycerides).  Improves the body's use of the hormone insulin by:  Increasing the body's insulin sensitivity.  Reducing the body's insulin needs.  Decreases the risk for heart disease because exercising:  Lowers cholesterol and triglycerides levels.  Increases the levels of good cholesterol (such as high-density lipoproteins [HDL]) in the body.  Lowers blood glucose levels. YOUR ACTIVITY PLAN  Choose an activity that you enjoy and set realistic goals. Your health care provider or diabetes educator can help you make an activity plan that works for you. You can break activities into 2 or 3 sessions throughout the day. Doing so is as good as one long session. Exercise ideas include:  Taking the dog for a walk.  Taking the stairs instead of the elevator.  Dancing to your favorite song.  Doing your favorite exercise with a friend. RECOMMENDATIONS FOR EXERCISING WITH TYPE 1 OR TYPE 2 DIABETES   Check your blood glucose before exercising. If blood glucose levels are greater than 240 mg/dL, check for urine ketones. Do not exercise if ketones are present.  Avoid injecting insulin into areas of the body that are going to be exercised. For example, avoid injecting insulin into:  The arms when playing tennis.  The legs when jogging.  Keep a record of:  Food intake before and after you exercise.  Expected peak times of insulin action.  Blood glucose levels before and after you exercise.  The type and amount of exercise you have done.  Review your records with your health care provider. Your health care provider will help you to develop guidelines for adjusting food intake and insulin amounts before and after exercising.  If you take insulin or oral hypoglycemic agents, watch  for signs and symptoms of hypoglycemia. They include:  Dizziness.  Shaking.  Sweating.  Chills.  Confusion.  Drink plenty of water while you exercise to prevent dehydration or heat stroke. Body water is lost during exercise and must be replaced.  Talk to your health care provider before starting an exercise program to make sure it is safe for you. Remember, almost any type of activity is better than none. Document Released: 05/13/2003 Document Revised: 10/23/2012 Document Reviewed: 07/30/2012 [employer] Patient Information 2014 [employer].  Cholesterol Cholesterol is a white, waxy, fat-like protein needed by your body in small amounts. The liver makes all the cholesterol you need. It is carried from the liver by the blood through the blood vessels. Deposits (plaque) may build up on blood vessel walls. This makes the arteries narrower and stiffer. Plaque increases the risk for heart attack and stroke. You cannot feel your cholesterol level even if it is very high. The only way to know is by a blood test to check your lipid (fats) levels. Once you know your cholesterol levels, you should keep a record of the test results. Work with your caregiver to to keep your levels in the desired range. WHAT THE RESULTS MEAN:  Total cholesterol is a rough measure of all the cholesterol   in your blood.  LDL is the so-called bad cholesterol. This is the type that deposits cholesterol in the walls of the arteries. You want this level to be low.  HDL is the good cholesterol because it cleans the arteries and carries the LDL away. You want this level to be high.  Triglycerides are fat that the body can either burn for energy or store. High levels are closely linked to heart disease. DESIRED LEVELS:  Total cholesterol below 200.  LDL below 100 for people at risk, below 70 for very high risk.  HDL above 50 is good, above 60 is best.  Triglycerides below 150. HOW TO LOWER YOUR  CHOLESTEROL:  Diet.  Choose fish or white meat chicken and turkey, roasted or baked. Limit fatty cuts of red meat, fried foods, and processed meats, such as sausage and lunch meat.  Eat lots of fresh fruits and vegetables. Choose whole grains, beans, pasta, potatoes and cereals.  Use only small amounts of olive, corn or canola oils. Avoid butter, mayonnaise, shortening or palm kernel oils. Avoid foods with trans-fats.  Use skim/nonfat milk and low-fat/nonfat yogurt and cheeses. Avoid whole milk, cream, ice cream, egg yolks and cheeses. Healthy desserts include angel food cake, ginger snaps, animal crackers, hard candy, popsicles, and low-fat/nonfat frozen yogurt. Avoid pastries, cakes, pies and cookies.  Exercise.  A regular program helps decrease LDL and raises HDL.  Helps with weight control.  Do things that increase your activity level like gardening, walking, or taking the stairs.  Medication.  May be prescribed by your caregiver to help lowering cholesterol and the risk for heart disease.  You may need medicine even if your levels are normal if you have several risk factors. HOME CARE INSTRUCTIONS   Follow your diet and exercise programs as suggested by your caregiver.  Take medications as directed.  Have blood work done when your caregiver feels it is necessary. MAKE SURE YOU:   Understand these instructions.  Will watch your condition.  Will get help right away if you are not doing well or get worse. Document Released: 11/15/2000 Document Revised: 05/15/2011 Document Reviewed: 12/04/2012 [employer] Patient Information 2014 [employer].  Vitamin D Deficiency Vitamin D is an important vitamin that your body needs. Having too little of it in your body is called a deficiency. A very bad deficiency can make your bones soft and can cause a condition called rickets.  Vitamin D is important to your body for different reasons, such as:   It helps your body absorb 2  minerals called calcium and phosphorus.  It helps make your bones healthy.  It may prevent some diseases, such as diabetes and multiple sclerosis.  It helps your muscles and heart. You can get vitamin D in several ways. It is a natural part of some foods. The vitamin is also added to some dairy products and cereals. Some people take vitamin D supplements. Also, your body makes vitamin D when you are in the sun. It changes the sun's rays into a form of the vitamin that your body can use. CAUSES   Not eating enough foods that contain vitamin D.  Not getting enough sunlight.  Having certain digestive system diseases that make it hard to absorb vitamin D. These diseases include Crohn's disease, chronic pancreatitis, and cystic fibrosis.  Having a surgery in which part of the stomach or small intestine is removed.  Being obese. Fat cells pull vitamin D out of your blood. That means that obese people   may not have enough vitamin D left in their blood and in other body tissues.  Having chronic kidney or liver disease. RISK FACTORS Risk factors are things that make you more likely to develop a vitamin D deficiency. They include:  Being older.  Not being able to get outside very much.  Living in a nursing home.  Having had broken bones.  Having weak or thin bones (osteoporosis).  Having a disease or condition that changes how your body absorbs vitamin D.  Having dark skin.  Some medicines such as seizure medicines or steroids.  Being overweight or obese. SYMPTOMS Mild cases of vitamin D deficiency may not have any symptoms. If you have a very bad case, symptoms may include:  Bone pain.  Muscle pain.  Falling often.  Broken bones caused by a minor injury, due to osteoporosis. DIAGNOSIS A blood test is the best way to tell if you have a vitamin D deficiency. TREATMENT Vitamin D deficiency can be treated in different ways. Treatment for vitamin D deficiency depends on what is  causing it. Options include:  Taking vitamin D supplements.  Taking a calcium supplement. Your caregiver will suggest what dose is best for you. HOME CARE INSTRUCTIONS  Take any supplements that your caregiver prescribes. Follow the directions carefully. Take only the suggested amount.  Have your blood tested 2 months after you start taking supplements.  Eat foods that contain vitamin D. Healthy choices include:  Fortified dairy products, cereals, or juices. Fortified means vitamin D has been added to the food. Check the label on the package to be sure.  Fatty fish like salmon or trout.  Eggs.  Oysters.  Do not use a tanning bed.  Keep your weight at a healthy level. Lose weight if you need to.  Keep all follow-up appointments. Your caregiver will need to perform blood tests to make sure your vitamin D deficiency is going away. SEEK MEDICAL CARE IF:  You have any questions about your treatment.  You continue to have symptoms of vitamin D deficiency.  You have nausea or vomiting.  You are constipated.  You feel confused.  You have severe abdominal or back pain. MAKE SURE YOU:  Understand these instructions.  Will watch your condition.  Will get help right away if you are not doing well or get worse. Document Released: 05/15/2011 Document Revised: 06/17/2012 Document Reviewed: 05/15/2011 [employer] Patient Information 2014 [employer].  

## 2013-03-18 LAB — VITAMIN D 25 HYDROXY (VIT D DEFICIENCY, FRACTURES): Vit D, 25-Hydroxy: 53 ng/mL (ref 30–89)

## 2013-03-18 LAB — INSULIN, FASTING: Insulin fasting, serum: 7 u[IU]/mL (ref 3–28)

## 2013-03-21 ENCOUNTER — Telehealth: Payer: Self-pay | Admitting: *Deleted

## 2013-03-21 NOTE — Telephone Encounter (Signed)
Spoke to about labs and results released to MyChart by Vicie Mutters.

## 2013-05-29 ENCOUNTER — Encounter: Payer: Self-pay | Admitting: Obstetrics & Gynecology

## 2013-05-29 ENCOUNTER — Ambulatory Visit (INDEPENDENT_AMBULATORY_CARE_PROVIDER_SITE_OTHER): Payer: 59 | Admitting: Obstetrics & Gynecology

## 2013-05-29 VITALS — BP 100/60 | HR 80 | Resp 18 | Ht 66.0 in | Wt 144.0 lb

## 2013-05-29 DIAGNOSIS — Z01419 Encounter for gynecological examination (general) (routine) without abnormal findings: Secondary | ICD-10-CM

## 2013-05-29 DIAGNOSIS — Z1239 Encounter for other screening for malignant neoplasm of breast: Secondary | ICD-10-CM

## 2013-05-29 DIAGNOSIS — E2839 Other primary ovarian failure: Secondary | ICD-10-CM

## 2013-05-29 DIAGNOSIS — Z124 Encounter for screening for malignant neoplasm of cervix: Secondary | ICD-10-CM

## 2013-05-29 MED ORDER — SPIRONOLACTONE 100 MG PO TABS: 200.0000 mg | ORAL_TABLET | Freq: Every day | ORAL | Status: DC

## 2013-05-29 MED ORDER — ESTRADIOL 10 MCG VA TABS
1.0000 | ORAL_TABLET | VAGINAL | Status: DC
Start: 2013-05-29 — End: 2014-06-05

## 2013-05-29 NOTE — Patient Instructions (Signed)

## 2013-05-29 NOTE — Progress Notes (Signed)
63 y.o. [id number] MarriedCaucasianF here for annual exam.  No vaginal exam.  Had some back pain late last year after lugging a big suitcase.  Goes every three months for labs with PCP.  Had some pelvic pain last summer.  Had PUS 8/14.  Reviewed U/S.  Was completely normal.  No vaginal bleeding.  Patient's last menstrual period was 12/05/2010.          Sexually active: yes  The current method of family planning is none.    Exercising: yes  Swimming Smoker:  no  Health Maintenance: Pap:  04/2011  History of abnormal Pap:  Yes, About 25 years ago MMG:  12/2012 - BI-RADS 1 : Neg Colonoscopy:  2006 - 10 years  BMD:   2009 TDaP:  11/2010 Screening Labs: PCP, Hb today: PCP, Urine today: PCP   reports that she has never smoked. She has never used smokeless tobacco. She reports that she drinks alcohol. She reports that she does not use illicit drugs.  Past Medical History  Diagnosis Date  . Degenerative arthritis   . PCOS (polycystic ovarian syndrome)   . AC (acromioclavicular) joint bone spurs   . Hypertension   . Goiter     multi nodular   . History of PCOS   . Hyperlipidemia   . Anemia   . Anxiety   . Depression   . GERD (gastroesophageal reflux disease)   . Nevus of left eye     Past Surgical History  Procedure Laterality Date  . Tubal ligation    . Tonsillectomy and adenoidectomy    . Bunionectomy Bilateral     Feet  . Toe surgery Left     Bone Spur    Current Outpatient Prescriptions  Medication Sig Dispense Refill  . amLODipine (NORVASC) 5 MG tablet Take 1 tablet (5 mg total) by mouth daily.  30 tablet  11  . B Complex Vitamins (VITAMIN B COMPLEX PO) Take by mouth.      . B COMPLEX-BIOTIN-FA PO Take by mouth.      . Cetirizine HCl (ZYRTEC ALLERGY PO) Take by mouth.      . Cholecalciferol (VITAMIN D PO) Take 5,000 Units by mouth daily.      . diphenhydrAMINE (BENADRYL) 25 MG tablet Take 25 mg by mouth every 6 (six) hours as needed.      Marland Kitchen estradiol (ESTRACE) 0.1  MG/GM vaginal cream Apply externally as directed  42.5 g  0  . Estradiol (VAGIFEM) 10 MCG TABS Place 1 tablet (10 mcg total) vaginally 2 (two) times a week.  24 tablet  3  . GLUCOSAMINE-CHONDROITIN PO Take 1,500 Units by mouth daily.       Marland Kitchen MAGNESIUM PO Take 800 Units by mouth daily.       Marland Kitchen MELATONIN PO Take by mouth daily.      . rosuvastatin (CRESTOR) 40 MG tablet Take 20 mg by mouth daily.      Marland Kitchen spironolactone (ALDACTONE) 100 MG tablet Take 200 mg by mouth daily.      . TURMERIC PO Take by mouth.      . ValACYclovir HCl (VALTREX PO) Take by mouth as needed.      . Omega-3 Fatty Acids (FISH OIL PO) Take 1,200 mg by mouth.       . Rizatriptan Benzoate (MAXALT PO) Take by mouth as needed.       No current facility-administered medications for this visit.    Family History  Problem Relation Age of Onset  .  Thyroid disease Mother     hyperthyroidism  . Hypertension Mother   . Cancer Mother   . Hyperlipidemia Mother   . Cancer Father   . Heart disease Brother   . Stroke Paternal Grandmother     ROS:  Pertinent items are noted in HPI.  Otherwise, a comprehensive ROS was negative.  Exam:   BP 100/60  Pulse 80  Resp 18  Ht 5\' 6"  (1.676 m)  Wt 144 lb (65.318 kg)  BMI 23.25 kg/m2  LMP 12/05/2010  Weight change: stable  Height: 5\' 6"  (167.6 cm)  Ht Readings from Last 3 Encounters:  05/29/13 5\' 6"  (1.676 m)  02/07/13 5\' 6"  (1.676 m)  05/21/12 5\' 6"  (1.676 m)    General appearance: alert, cooperative and appears stated age Head: Normocephalic, without obvious abnormality, atraumatic Neck: no adenopathy, supple, symmetrical, trachea midline and thyroid normal to inspection and palpation Lungs: clear to auscultation bilaterally Breasts: normal appearance, no masses or tenderness Heart: regular rate and rhythm Abdomen: soft, non-tender; bowel sounds normal; no masses,  no organomegaly Extremities: extremities normal, atraumatic, no cyanosis or edema Skin: Skin color, texture,  turgor normal. No rashes or lesions Lymph nodes: Cervical, supraclavicular, and axillary nodes normal. No abnormal inguinal nodes palpated Neurologic: Grossly normal   Pelvic: External genitalia:  no lesions              Urethra:  normal appearing urethra with no masses, tenderness or lesions              Bartholins and Skenes: normal                 Vagina: normal appearing vagina with normal color and discharge, no lesions              Cervix: no lesions              Pap taken: yes Bimanual Exam:  Uterus:  normal size, contour, position, consistency, mobility, non-tender              Adnexa: normal adnexa and no mass, fullness, tenderness               Rectovaginal: Confirms               Anus:  normal sphincter tone, no lesions  A:  Well Woman with normal exam Multinodular goiter with last u/s 5/14 PCOS Hirsutism Vaginal atrophic changes  P:   Mammogram yearly.  Will do BMD with next MMG.  Order placed for both MMG and BMD. Labs with PCP every three months. Last thyroid u/s was 5/14.  Stable from prior year. pap smear only today vagifem 16meq pv twice weekly.  #24/4RF. Spirinolactone 100mg  bid 180tabs/4RF Pt will call when she needs refill on Estrace return annually or prn  An After Visit Summary was printed and given to the patient.

## 2013-05-30 LAB — IPS PAP SMEAR ONLY

## 2013-06-17 ENCOUNTER — Ambulatory Visit (INDEPENDENT_AMBULATORY_CARE_PROVIDER_SITE_OTHER): Payer: 59 | Admitting: Emergency Medicine

## 2013-06-17 ENCOUNTER — Encounter: Payer: Self-pay | Admitting: Emergency Medicine

## 2013-06-17 VITALS — BP 106/60 | HR 72 | Temp 97.8°F | Resp 16 | Ht 66.25 in | Wt 143.0 lb

## 2013-06-17 DIAGNOSIS — R7309 Other abnormal glucose: Secondary | ICD-10-CM

## 2013-06-17 DIAGNOSIS — E782 Mixed hyperlipidemia: Secondary | ICD-10-CM

## 2013-06-17 DIAGNOSIS — I10 Essential (primary) hypertension: Secondary | ICD-10-CM

## 2013-06-17 DIAGNOSIS — E889 Metabolic disorder, unspecified: Secondary | ICD-10-CM

## 2013-06-17 LAB — HEMOGLOBIN A1C
Hgb A1c MFr Bld: 6.2 % — ABNORMAL HIGH (ref <5.7)
Mean Plasma Glucose: 131 mg/dL — ABNORMAL HIGH (ref <117)

## 2013-06-17 LAB — CBC WITH DIFFERENTIAL/PLATELET
Basophils Absolute: 0.1 10*3/uL (ref 0.0–0.1)
Basophils Relative: 1 % (ref 0–1)
Eosinophils Absolute: 0.1 10*3/uL (ref 0.0–0.7)
Eosinophils Relative: 2 % (ref 0–5)
HCT: 41.8 % (ref 36.0–46.0)
Hemoglobin: 14.4 g/dL (ref 12.0–15.0)
Lymphocytes Relative: 25 % (ref 12–46)
Lymphs Abs: 1.3 10*3/uL (ref 0.7–4.0)
MCH: 29.8 pg (ref 26.0–34.0)
MCHC: 34.4 g/dL (ref 30.0–36.0)
MCV: 86.5 fL (ref 78.0–100.0)
Monocytes Absolute: 0.4 10*3/uL (ref 0.1–1.0)
Monocytes Relative: 8 % (ref 3–12)
Neutro Abs: 3.4 10*3/uL (ref 1.7–7.7)
Neutrophils Relative %: 64 % (ref 43–77)
Platelets: 294 10*3/uL (ref 150–400)
RBC: 4.83 MIL/uL (ref 3.87–5.11)
RDW: 12.7 % (ref 11.5–15.5)
WBC: 5.3 10*3/uL (ref 4.0–10.5)

## 2013-06-17 NOTE — Patient Instructions (Signed)
   Bad carbs also include fruit juice, alcohol, and sweet tea. These are empty calories that do not signal to your brain that you are full.   Please remember the good carbs are still carbs which convert into sugar. So please measure them out no more than 1/2-1 cup of rice, oatmeal, pasta, and beans.  Veggies are however free foods! Pile them on.   I like lean protein at every meal such as chicken, [place], pork chops, cottage cheese, etc. Just do not fry these meats and please center your meal around vegetable, the meats should be a side dish.   No all fruit is created equal. Please see the list below, the fruit at the bottom is higher in sugars than the fruit at the top     Allergic Rhinitis Allergic rhinitis is when the mucous membranes in the nose respond to allergens. Allergens are particles in the air that cause your body to have an allergic reaction. This causes you to release allergic antibodies. Through a chain of events, these eventually cause you to release histamine into the blood stream. Although meant to protect the body, it is this release of histamine that causes your discomfort, such as frequent sneezing, congestion, and an itchy, runny nose.  CAUSES  Seasonal allergic rhinitis (hay fever) is caused by pollen allergens that may come from grasses, trees, and weeds. Year-round allergic rhinitis (perennial allergic rhinitis) is caused by allergens such as house dust mites, pet dander, and mold spores.  SYMPTOMS   Nasal stuffiness (congestion).  Itchy, runny nose with sneezing and tearing of the eyes. DIAGNOSIS  Your health care provider can help you determine the allergen or allergens that trigger your symptoms. If you and your health care provider are unable to determine the allergen, skin or blood testing may be used. TREATMENT  Allergic Rhinitis does not have a cure, but it can be controlled by:  Medicines and allergy shots (immunotherapy).  Avoiding the allergen. Hay  fever may often be treated with antihistamines in pill or nasal spray forms. Antihistamines block the effects of histamine. There are over-the-counter medicines that may help with nasal congestion and swelling around the eyes. Check with your health care provider before taking or giving this medicine.  If avoiding the allergen or the medicine prescribed do not work, there are many new medicines your health care provider can prescribe. Stronger medicine may be used if initial measures are ineffective. Desensitizing injections can be used if medicine and avoidance does not work. Desensitization is when a patient is given ongoing shots until the body becomes less sensitive to the allergen. Make sure you follow up with your health care provider if problems continue. HOME CARE INSTRUCTIONS It is not possible to completely avoid allergens, but you can reduce your symptoms by taking steps to limit your exposure to them. It helps to know exactly what you are allergic to so that you can avoid your specific triggers. SEEK MEDICAL CARE IF:   You have a fever.  You develop a cough that does not stop easily (persistent).  You have shortness of breath.  You start wheezing.  Symptoms interfere with normal daily activities. Document Released: 11/15/2000 Document Revised: 12/11/2012 Document Reviewed: 10/28/2012 [facility] Patient Information 2014 [place].

## 2013-06-17 NOTE — Progress Notes (Signed)
Subjective:    Patient ID: Christine [name], female    DOB: [birth year], 63 y.o.   MRN: [id number]  HPI Comments: 63 yo WF presents for 3 month F/U for HTN, Cholesterol, Pre-Dm, D. Deficient. She is exercising routinely and eating healthy for most part.  She notes BP has been good. She has had 3 weeks of increased allergy drainage. She has noticed increased fatigue and allergy congestion. She had green production 2 weeks ago but has improved to clear. CHOL         190   03/17/2013 HDL           59   03/17/2013 LDLCALC       97   03/17/2013 TRIG         168   03/17/2013 CHOLHDL      3.2   03/17/2013 ALT           26   03/17/2013 AST           21   03/17/2013 ALKPHOS       91   03/17/2013 BILITOT      0.5   03/17/2013 CREATININE     0.83   03/17/2013 BUN              14   03/17/2013 NA              140   03/17/2013 K               5.3   03/17/2013 CL              101   03/17/2013 CO2              30   03/17/2013 HGBA1C      5.8   03/17/2013 WBC      5.3   03/17/2013 HGB     14.9   03/17/2013 HCT     43.1   03/17/2013 MCV     90.9   03/17/2013 PLT      295   03/17/2013   Hypertension  Hyperlipidemia      Medication List       This list is accurate as of: 06/17/13  9:02 AM.  Always use your most recent med list.               amLODipine 2.5 MG tablet  Commonly known as:  NORVASC  Take 2.5 mg by mouth daily.     diphenhydrAMINE 25 MG tablet  Commonly known as:  BENADRYL  Take 25 mg by mouth every 6 (six) hours as needed.     estradiol 0.1 MG/GM vaginal cream  Commonly known as:  ESTRACE  Apply externally as directed     Estradiol 10 MCG Tabs vaginal tablet  Commonly known as:  VAGIFEM  Place 1 tablet (10 mcg total) vaginally 2 (two) times a week.     FISH OIL PO  Take 1,200 mg by mouth.     GLUCOSAMINE-CHONDROITIN PO  Take 1,500 Units by mouth daily.     HAIR/SKIN/NAILS PO  Take 3 tablets by mouth daily.     MAGNESIUM PO  Take 800 Units by mouth daily.     MELATONIN PO   Take by mouth daily.     rosuvastatin 40 MG tablet  Commonly known as:  CRESTOR  Take 20 mg by mouth daily.     spironolactone 100 MG tablet  Commonly known as:  ALDACTONE  Take 2 tablets (  200 mg total) by mouth daily.     TURMERIC PO  Take by mouth.     VALTREX PO  Take by mouth as needed.     VITAMIN B COMPLEX PO  Take by mouth.     VITAMIN D PO  Take 5,000 Units by mouth daily.     ZYRTEC ALLERGY PO  Take by mouth.       Allergies  Allergen Reactions  . Bactrim [Sulfamethoxazole-Tmp Ds] Itching  . Lipitor [Atorvastatin] Other (See Comments)    headache  . Monistat [Miconazole] Itching   Past Medical History  Diagnosis Date  . Degenerative arthritis   . PCOS (polycystic ovarian syndrome)   . AC (acromioclavicular) joint bone spurs   . Hypertension   . Goiter     multi nodular   . History of PCOS   . Hyperlipidemia   . Anemia   . Anxiety   . Depression   . GERD (gastroesophageal reflux disease)   . Nevus of left eye      Review of Systems  HENT: Positive for congestion, postnasal drip and sinus pressure.   All other systems reviewed and are negative.  BP 106/60  Pulse 72  Temp(Src) 97.8 F (36.6 C) (Temporal)  Resp 16  Ht 5' 6.25" (1.683 m)  Wt 143 lb (64.864 kg)  BMI 22.90 kg/m2  LMP 12/05/2010     Objective:   Physical Exam  Nursing note and vitals reviewed. Constitutional: She is oriented to person, place, and time. She appears well-developed and well-nourished. No distress.  HENT:  Head: Normocephalic and atraumatic.  Right Ear: External ear normal.  Left Ear: External ear normal.  Nose: Nose normal.  Mouth/Throat: Oropharynx is clear and moist.  Cloudy TM's bilaterally, mild Frontal tenderness  Eyes: Conjunctivae and EOM are normal.  Neck: Normal range of motion. Neck supple. No JVD present. No thyromegaly present.  Cardiovascular: Normal rate, regular rhythm, normal heart sounds and intact distal pulses.   Pulmonary/Chest:  Effort normal and breath sounds normal.  Abdominal: Soft. Bowel sounds are normal. She exhibits no distension and no mass. There is no tenderness. There is no rebound and no guarding.  Musculoskeletal: Normal range of motion. She exhibits no edema and no tenderness.  Lymphadenopathy:    She has no cervical adenopathy.  Neurological: She is alert and oriented to person, place, and time. No cranial nerve deficit.  Skin: Skin is warm and dry. No rash noted. No erythema. No pallor.  Psychiatric: She has a normal mood and affect. Her behavior is normal. Judgment and thought content normal.          Assessment & Plan:  1.  3 month F/U for HTN, Cholesterol, Pre-Dm, D. Deficient. Needs healthy diet, cardio QD and obtain healthy weight. Check Labs, Check BP if >130/80 call office  2. Allergic rhinitis- Allegra OTC, increase H2o, allergy hygiene explained. If symptoms increase will call for antibiotic or if labs positive.

## 2013-06-18 LAB — BASIC METABOLIC PANEL WITH GFR
BUN: 15 mg/dL (ref 6–23)
CO2: 28 mEq/L (ref 19–32)
Calcium: 9.9 mg/dL (ref 8.4–10.5)
Chloride: 99 mEq/L (ref 96–112)
Creat: 0.82 mg/dL (ref 0.50–1.10)
GFR, Est African American: 89 mL/min
GFR, Est Non African American: 77 mL/min
Glucose, Bld: 96 mg/dL (ref 70–99)
Potassium: 4.5 mEq/L (ref 3.5–5.3)
Sodium: 137 mEq/L (ref 135–145)

## 2013-06-18 LAB — HEPATIC FUNCTION PANEL
ALT: 31 U/L (ref 0–35)
AST: 21 U/L (ref 0–37)
Albumin: 4.6 g/dL (ref 3.5–5.2)
Alkaline Phosphatase: 84 U/L (ref 39–117)
Bilirubin, Direct: 0.1 mg/dL (ref 0.0–0.3)
Indirect Bilirubin: 0.5 mg/dL (ref 0.2–1.2)
Total Bilirubin: 0.6 mg/dL (ref 0.2–1.2)
Total Protein: 6.8 g/dL (ref 6.0–8.3)

## 2013-06-18 LAB — LIPID PANEL
Cholesterol: 218 mg/dL — ABNORMAL HIGH (ref 0–200)
HDL: 64 mg/dL (ref >39)
LDL Cholesterol: 123 mg/dL — ABNORMAL HIGH (ref 0–99)
Total CHOL/HDL Ratio: 3.4 Ratio
Triglycerides: 157 mg/dL — ABNORMAL HIGH (ref <150)
VLDL: 31 mg/dL (ref 0–40)

## 2013-06-18 LAB — INSULIN, FASTING: Insulin fasting, serum: 8 u[IU]/mL (ref 3–28)

## 2013-06-20 ENCOUNTER — Telehealth: Payer: Self-pay | Admitting: *Deleted

## 2013-06-20 MED ORDER — AZITHROMYCIN 250 MG PO TABS: ORAL_TABLET | ORAL | Status: AC

## 2013-06-20 NOTE — Telephone Encounter (Signed)
Patient called with c/o cold symptoms still present from 4 14/15 office visit with Kelby Aline, PA-C.  Patient states she declined abx Rx at that visit with hopes of symptoms being viral.  Patient states Melissa told her to call back if symptoms persist or increase in severity.  Melissa not in office today and per Vicie Mutters, PA-C orders Rx for Zpak sent in for patient.  Advised f/u if no relief with Zpak.

## 2013-07-10 ENCOUNTER — Telehealth: Payer: Self-pay

## 2013-07-10 ENCOUNTER — Other Ambulatory Visit: Payer: Self-pay | Admitting: Physician Assistant

## 2013-07-10 MED ORDER — LEVOFLOXACIN 500 MG PO TABS: 500.0000 mg | ORAL_TABLET | Freq: Every day | ORAL | Status: AC

## 2013-07-10 NOTE — Telephone Encounter (Signed)
Received written note from front office staff, per patient she is no better after taking a z-pak and zyrtec and nasal spray as instructed on 06-20-13, Vicie Mutters, PA prescribed a new antibiotic for her and it was sent to pharmacy today, called patient at work number and left a message that if no better after this antibiotic then she needs to schedule a follow up to be seen

## 2013-08-11 ENCOUNTER — Other Ambulatory Visit: Payer: Self-pay | Admitting: Internal Medicine

## 2013-09-15 ENCOUNTER — Ambulatory Visit (INDEPENDENT_AMBULATORY_CARE_PROVIDER_SITE_OTHER): Payer: 59 | Admitting: Physician Assistant

## 2013-09-15 ENCOUNTER — Encounter: Payer: Self-pay | Admitting: Physician Assistant

## 2013-09-15 VITALS — BP 100/60 | HR 64 | Temp 97.9°F | Resp 16 | Ht 66.0 in | Wt 137.0 lb

## 2013-09-15 DIAGNOSIS — Z79899 Other long term (current) drug therapy: Secondary | ICD-10-CM

## 2013-09-15 DIAGNOSIS — I10 Essential (primary) hypertension: Secondary | ICD-10-CM

## 2013-09-15 DIAGNOSIS — E559 Vitamin D deficiency, unspecified: Secondary | ICD-10-CM

## 2013-09-15 DIAGNOSIS — E782 Mixed hyperlipidemia: Secondary | ICD-10-CM

## 2013-09-15 DIAGNOSIS — R7309 Other abnormal glucose: Secondary | ICD-10-CM

## 2013-09-15 LAB — BASIC METABOLIC PANEL WITH GFR
BUN: 18 mg/dL (ref 6–23)
CO2: 30 mEq/L (ref 19–32)
Calcium: 10.2 mg/dL (ref 8.4–10.5)
Chloride: 102 mEq/L (ref 96–112)
Creat: 0.93 mg/dL (ref 0.50–1.10)
GFR, Est African American: 77 mL/min
GFR, Est Non African American: 67 mL/min
Glucose, Bld: 99 mg/dL (ref 70–99)
Potassium: 5.5 mEq/L — ABNORMAL HIGH (ref 3.5–5.3)
Sodium: 141 mEq/L (ref 135–145)

## 2013-09-15 LAB — HEPATIC FUNCTION PANEL
ALT: 22 U/L (ref 0–35)
AST: 18 U/L (ref 0–37)
Albumin: 4.9 g/dL (ref 3.5–5.2)
Alkaline Phosphatase: 90 U/L (ref 39–117)
Bilirubin, Direct: 0.1 mg/dL (ref 0.0–0.3)
Indirect Bilirubin: 0.4 mg/dL (ref 0.2–1.2)
Total Bilirubin: 0.5 mg/dL (ref 0.2–1.2)
Total Protein: 7 g/dL (ref 6.0–8.3)

## 2013-09-15 LAB — CBC WITH DIFFERENTIAL/PLATELET
Basophils Absolute: 0 10*3/uL (ref 0.0–0.1)
Basophils Relative: 1 % (ref 0–1)
Eosinophils Absolute: 0.1 10*3/uL (ref 0.0–0.7)
Eosinophils Relative: 2 % (ref 0–5)
HCT: 44.9 % (ref 36.0–46.0)
Hemoglobin: 15.7 g/dL — ABNORMAL HIGH (ref 12.0–15.0)
Lymphocytes Relative: 28 % (ref 12–46)
Lymphs Abs: 1.3 10*3/uL (ref 0.7–4.0)
MCH: 30.7 pg (ref 26.0–34.0)
MCHC: 35 g/dL (ref 30.0–36.0)
MCV: 87.9 fL (ref 78.0–100.0)
Monocytes Absolute: 0.3 10*3/uL (ref 0.1–1.0)
Monocytes Relative: 7 % (ref 3–12)
Neutro Abs: 3 10*3/uL (ref 1.7–7.7)
Neutrophils Relative %: 62 % (ref 43–77)
Platelets: 258 10*3/uL (ref 150–400)
RBC: 5.11 MIL/uL (ref 3.87–5.11)
RDW: 12.3 % (ref 11.5–15.5)
WBC: 4.8 10*3/uL (ref 4.0–10.5)

## 2013-09-15 LAB — TSH: TSH: 1.625 u[IU]/mL (ref 0.350–4.500)

## 2013-09-15 LAB — LIPID PANEL
Cholesterol: 217 mg/dL — ABNORMAL HIGH (ref 0–200)
HDL: 66 mg/dL (ref >39)
LDL Cholesterol: 130 mg/dL — ABNORMAL HIGH (ref 0–99)
Total CHOL/HDL Ratio: 3.3 Ratio
Triglycerides: 105 mg/dL (ref <150)
VLDL: 21 mg/dL (ref 0–40)

## 2013-09-15 LAB — VITAMIN D 25 HYDROXY (VIT D DEFICIENCY, FRACTURES): Vit D, 25-Hydroxy: 55 ng/mL (ref 30–89)

## 2013-09-15 LAB — HEMOGLOBIN A1C
Hgb A1c MFr Bld: 5.5 % (ref <5.7)
Mean Plasma Glucose: 111 mg/dL (ref <117)

## 2013-09-15 LAB — MAGNESIUM: Magnesium: 2.1 mg/dL (ref 1.5–2.5)

## 2013-09-15 NOTE — Patient Instructions (Signed)

## 2013-09-15 NOTE — Progress Notes (Signed)
Assessment and Plan:  Hypertension: Continue medication, monitor blood pressure at home. Continue DASH diet. Cholesterol: Continue diet and exercise. Check cholesterol.  Pre-diabetes-Continue diet and exercise. Check A1C Vitamin D Def- check level and continue medications.   Continue diet and meds as discussed. Further disposition pending results of labs.  HPI 63 y.o. female  presents for 3 month follow up with hypertension, hyperlipidemia, prediabetes and vitamin D. Her blood pressure has been controlled at home, today their BP is BP: 100/60 mmHg She does workout. She denies chest pain, shortness of breath, dizziness.  She is on cholesterol medication and denies myalgias. Her cholesterol is at goal. The cholesterol last visit was:   Lab Results  Component Value Date   CHOL 218* 06/17/2013   HDL 64 06/17/2013   LDLCALC 123* 06/17/2013   TRIG 157* 06/17/2013   CHOLHDL 3.4 06/17/2013   She has been working on diet and exercise for prediabetes, and denies polydipsia, polyuria and visual disturbances. Last A1C in the office was:  Lab Results  Component Value Date   HGBA1C 6.2* 06/17/2013   Patient is on Vitamin D supplement.   Lab Results  Component Value Date   VD25OH 21 03/17/2013     She has been doing a clean eating/unprocessed diet and has been dong better. She has been on it 6 weeks total.   Current Medications:  Current Outpatient Prescriptions on File Prior to Visit  Medication Sig Dispense Refill  . amLODipine (NORVASC) 2.5 MG tablet Take 2.5 mg by mouth daily.      . B Complex Vitamins (VITAMIN B COMPLEX PO) Take by mouth.      . Cetirizine HCl (ZYRTEC ALLERGY PO) Take by mouth.      . Cholecalciferol (VITAMIN D PO) Take 5,000 Units by mouth daily.      . CRESTOR 40 MG tablet Take 1 tablet by mouth  every day for cholesterol  90 tablet  0  . diphenhydrAMINE (BENADRYL) 25 MG tablet Take 25 mg by mouth every 6 (six) hours as needed.      Marland Kitchen estradiol (ESTRACE) 0.1 MG/GM vaginal  cream Apply externally as directed  42.5 g  0  . Estradiol (VAGIFEM) 10 MCG TABS vaginal tablet Place 1 tablet (10 mcg total) vaginally 2 (two) times a week.  24 tablet  3  . GLUCOSAMINE-CHONDROITIN PO Take 1,500 Units by mouth daily.       Marland Kitchen MAGNESIUM PO Take 800 Units by mouth daily.       Marland Kitchen MELATONIN PO Take by mouth daily.      . Multiple Vitamins-Minerals (HAIR/SKIN/NAILS PO) Take 3 tablets by mouth daily.      . Omega-3 Fatty Acids (FISH OIL PO) Take 1,200 mg by mouth.       . spironolactone (ALDACTONE) 100 MG tablet Take 2 tablets (200 mg total) by mouth daily.  180 tablet  4  . TURMERIC PO Take by mouth.      . ValACYclovir HCl (VALTREX PO) Take by mouth as needed.       No current facility-administered medications on file prior to visit.   Medical History:  Past Medical History  Diagnosis Date  . Degenerative arthritis   . PCOS (polycystic ovarian syndrome)   . AC (acromioclavicular) joint bone spurs   . Hypertension   . Goiter     multi nodular   . History of PCOS   . Hyperlipidemia   . Anemia   . Anxiety   . Depression   .  GERD (gastroesophageal reflux disease)   . Nevus of left eye    Allergies:  Allergies  Allergen Reactions  . Bactrim [Sulfamethoxazole-Tmp Ds] Itching  . Lipitor [Atorvastatin] Other (See Comments)    headache  . Monistat [Miconazole] Itching     Review of Systems: [X]  = complains of  [ ]  = denies  General: Fatigue [ ]  Fever [ ]  Chills [ ]  Weakness [ ]   Insomnia [ ]  Eyes: Redness [ ]  Blurred vision [ ]  Diplopia [ ]   ENT: Congestion [ ]  Sinus Pain [ ]  Post Nasal Drip [ ]  Sore Throat [ ]  Earache [ ]   Cardiac: Chest pain/pressure [ ]  SOB [ ]  Orthopnea [ ]   Palpitations [ ]   Paroxysmal nocturnal dyspnea[ ]  Claudication [ ]  Edema [ ]   Pulmonary: Cough [ ]  Wheezing[ ]   SOB [ ]   Snoring [ ]   GI: Nausea [ ]  Vomiting[ ]  Dysphagia[ ]  Heartburn[ ]  Abdominal pain [ ]  Constipation [ ] ; Diarrhea [ ] ; BRBPR [ ]  Melena[ ]  GU: Hematuria[ ]  Dysuria [ ]   Nocturia[ ]  Urgency [ ]   Hesitancy [ ]  Discharge [ ]  Neuro: Headaches[ ]  Vertigo[ ]  Paresthesias[ ]  Spasm [ ]  Speech changes [ ]  Incoordination [ ]   Ortho: Arthritis [ ]  Joint pain [ ]  Muscle pain [ ]  Joint swelling [ ]  Back Pain [ ]  Skin:  Rash [ ]   Pruritis [ ]  Change in skin lesion [ ]   Psych: Depression[ ]  Anxiety[ ]  Confusion [ ]  Memory loss [ ]   Heme/Lypmh: Bleeding [ ]  Bruising [ ]  Enlarged lymph nodes [ ]   Endocrine: Visual blurring [ ]  Paresthesia [ ]  Polyuria [ ]  Polydypsea [ ]    Heat/cold intolerance [ ]  Hypoglycemia [ ]   Family history- Review and unchanged Social history- Review and unchanged Physical Exam: BP 100/60  Pulse 64  Temp(Src) 97.9 F (36.6 C)  Resp 16  Ht 5\' 6"  (1.676 m)  Wt 137 lb (62.143 kg)  BMI 22.12 kg/m2  LMP 12/05/2010 Wt Readings from Last 3 Encounters:  09/15/13 137 lb (62.143 kg)  06/17/13 143 lb (64.864 kg)  05/29/13 144 lb (65.318 kg)   General Appearance: Well nourished, in no apparent distress. Eyes: PERRLA, EOMs, conjunctiva no swelling or erythema Sinuses: No Frontal/maxillary tenderness ENT/Mouth: Ext aud canals clear, TMs without erythema, bulging. No erythema, swelling, or exudate on post pharynx.  Tonsils not swollen or erythematous. Hearing normal.  Neck: Supple, thyroid normal.  Respiratory: Respiratory effort normal, BS equal bilaterally without rales, rhonchi, wheezing or stridor.  Cardio: RRR with no MRGs. Brisk peripheral pulses without edema.  Abdomen: Soft, + BS.  Non tender, no guarding, rebound, hernias, masses. Lymphatics: Non tender without lymphadenopathy.  Musculoskeletal: Full ROM, 5/5 strength, normal gait.  Skin: Warm, dry without rashes, lesions, ecchymosis.  Neuro: Cranial nerves intact. Normal muscle tone, no cerebellar symptoms. Sensation intact.  Psych: Awake and oriented X 3, normal affect, Insight and Judgment appropriate.    Vicie Mutters 8:46 AM

## 2013-09-23 ENCOUNTER — Other Ambulatory Visit: Payer: Self-pay | Admitting: Emergency Medicine

## 2013-10-04 ENCOUNTER — Other Ambulatory Visit: Payer: Self-pay | Admitting: Emergency Medicine

## 2013-10-22 ENCOUNTER — Ambulatory Visit: Payer: Self-pay | Admitting: Internal Medicine

## 2013-10-22 ENCOUNTER — Ambulatory Visit: Payer: Self-pay | Admitting: Physician Assistant

## 2013-12-23 ENCOUNTER — Encounter: Payer: Self-pay | Admitting: Emergency Medicine

## 2013-12-23 ENCOUNTER — Ambulatory Visit (INDEPENDENT_AMBULATORY_CARE_PROVIDER_SITE_OTHER): Payer: 59 | Admitting: Emergency Medicine

## 2013-12-23 VITALS — BP 108/60 | HR 58 | Temp 98.0°F | Resp 16 | Ht 66.0 in | Wt 135.0 lb

## 2013-12-23 DIAGNOSIS — R0683 Snoring: Secondary | ICD-10-CM

## 2013-12-23 DIAGNOSIS — Z0001 Encounter for general adult medical examination with abnormal findings: Secondary | ICD-10-CM

## 2013-12-23 DIAGNOSIS — E782 Mixed hyperlipidemia: Secondary | ICD-10-CM

## 2013-12-23 DIAGNOSIS — R6889 Other general symptoms and signs: Secondary | ICD-10-CM

## 2013-12-23 DIAGNOSIS — Z0000 Encounter for general adult medical examination without abnormal findings: Secondary | ICD-10-CM

## 2013-12-23 DIAGNOSIS — R5383 Other fatigue: Secondary | ICD-10-CM

## 2013-12-23 DIAGNOSIS — I10 Essential (primary) hypertension: Secondary | ICD-10-CM

## 2013-12-23 DIAGNOSIS — Z23 Encounter for immunization: Secondary | ICD-10-CM

## 2013-12-23 LAB — CBC WITH DIFFERENTIAL/PLATELET
Basophils Absolute: 0.1 10*3/uL (ref 0.0–0.1)
Basophils Relative: 1 % (ref 0–1)
Eosinophils Absolute: 0.2 10*3/uL (ref 0.0–0.7)
Eosinophils Relative: 3 % (ref 0–5)
HCT: 44 % (ref 36.0–46.0)
Hemoglobin: 14.9 g/dL (ref 12.0–15.0)
Lymphocytes Relative: 22 % (ref 12–46)
Lymphs Abs: 1.2 10*3/uL (ref 0.7–4.0)
MCH: 30.5 pg (ref 26.0–34.0)
MCHC: 33.9 g/dL (ref 30.0–36.0)
MCV: 90.2 fL (ref 78.0–100.0)
Monocytes Absolute: 0.4 10*3/uL (ref 0.1–1.0)
Monocytes Relative: 7 % (ref 3–12)
Neutro Abs: 3.7 10*3/uL (ref 1.7–7.7)
Neutrophils Relative %: 67 % (ref 43–77)
Platelets: 291 10*3/uL (ref 150–400)
RBC: 4.88 MIL/uL (ref 3.87–5.11)
RDW: 12.8 % (ref 11.5–15.5)
WBC: 5.5 10*3/uL (ref 4.0–10.5)

## 2013-12-23 LAB — HEPATIC FUNCTION PANEL
ALT: 22 U/L (ref 0–35)
AST: 20 U/L (ref 0–37)
Albumin: 5 g/dL (ref 3.5–5.2)
Alkaline Phosphatase: 83 U/L (ref 39–117)
Bilirubin, Direct: 0.1 mg/dL (ref 0.0–0.3)
Indirect Bilirubin: 0.5 mg/dL (ref 0.2–1.2)
Total Bilirubin: 0.6 mg/dL (ref 0.2–1.2)
Total Protein: 6.9 g/dL (ref 6.0–8.3)

## 2013-12-23 LAB — LIPID PANEL
Cholesterol: 164 mg/dL (ref 0–200)
HDL: 64 mg/dL (ref >39)
LDL Cholesterol: 79 mg/dL (ref 0–99)
Total CHOL/HDL Ratio: 2.6 Ratio
Triglycerides: 105 mg/dL (ref <150)
VLDL: 21 mg/dL (ref 0–40)

## 2013-12-23 LAB — MAGNESIUM: Magnesium: 1.8 mg/dL (ref 1.5–2.5)

## 2013-12-23 LAB — BASIC METABOLIC PANEL WITH GFR
BUN: 16 mg/dL (ref 6–23)
CO2: 29 mEq/L (ref 19–32)
Calcium: 10.1 mg/dL (ref 8.4–10.5)
Chloride: 102 mEq/L (ref 96–112)
Creat: 0.83 mg/dL (ref 0.50–1.10)
GFR, Est African American: 87 mL/min
GFR, Est Non African American: 76 mL/min
Glucose, Bld: 105 mg/dL — ABNORMAL HIGH (ref 70–99)
Potassium: 5.2 mEq/L (ref 3.5–5.3)
Sodium: 140 mEq/L (ref 135–145)

## 2013-12-23 LAB — VITAMIN B12: Vitamin B-12: 470 pg/mL (ref 211–911)

## 2013-12-23 LAB — TSH: TSH: 1.471 u[IU]/mL (ref 0.350–4.500)

## 2013-12-23 NOTE — Progress Notes (Signed)
Subjective:    Patient ID: Christine [name], female    DOB: [birth year], 63 y.o.   MRN: [id number]  HPI Comments: 63 yo WF CPE and presents for 3 month F/U for HTN, Cholesterol, Pre-Dm, D. Deficient.  She is unsure of cholesterol RX status but DOES NOT THINK she is on Crestor due to muscle tear/ memory issues. She has not been taking BP RX routinely. She does notice if skips more than 3 days she will have palpitations or elevated BP with stress.  She does note some a.m. Fatigue. She has history of snoring and FHX of OSA.   She almost fell this morning and twisted neck and shoulder on left. She notes mild stiffness and discomfort but denies pain. She does not want treatment for this new issue.     WBC             4.8   09/15/2013 HGB            15.7   09/15/2013 HCT            44.9   09/15/2013 PLT             258   09/15/2013 GLUCOSE          99   09/15/2013 CHOL            217   09/15/2013 TRIG            105   09/15/2013 HDL              66   09/15/2013 LDLCALC         130   09/15/2013 ALT              22   09/15/2013 AST              18   09/15/2013 NA              141   09/15/2013 K               5.5   09/15/2013 CL              102   09/15/2013 CREATININE     0.93   09/15/2013 BUN              18   09/15/2013 CO2              30   09/15/2013 TSH           1.625   09/15/2013 HGBA1C          5.5   09/15/2013   Hyperlipidemia Pertinent negatives include no chest pain or shortness of breath.  Hypertension Associated symptoms include neck pain. Pertinent negatives include no chest pain or shortness of breath.     Medication List       This list is accurate as of: 12/23/13 11:59 PM.  Always use your most recent med list.               amLODipine 2.5 MG tablet  Commonly known as:  NORVASC  TAKE 1 TABLET BY MOUTH EVERY DAY     CRESTOR 40 MG tablet  Generic drug:  rosuvastatin  Take 1 tablet by mouth  every day for cholesterol     diphenhydrAMINE 25 MG tablet  Commonly known as:   BENADRYL  Take 25 mg by mouth every 6 (six) hours as needed.     estradiol 0.1 MG/GM vaginal cream  Commonly known as:  ESTRACE  Apply externally as directed     Estradiol 10 MCG Tabs vaginal tablet  Commonly known as:  VAGIFEM  Place 1 tablet (10 mcg total) vaginally 2 (two) times a week.     FISH OIL PO  Take 1,200 mg by mouth.     GLUCOSAMINE-CHONDROITIN PO  Take 1,500 Units by mouth daily.     HAIR/SKIN/NAILS PO  Take 3 tablets by mouth daily.     MAGNESIUM PO  Take 800 Units by mouth daily.     MELATONIN PO  Take by mouth daily.     spironolactone 100 MG tablet  Commonly known as:  ALDACTONE  Take 2 tablets (200 mg total) by mouth daily.     VALTREX PO  Take by mouth as needed.     VITAMIN B COMPLEX PO  Take by mouth.     VITAMIN D PO  Take 5,000 Units by mouth daily.     ZYRTEC ALLERGY PO  Take by mouth.        Allergies  Allergen Reactions  . Bactrim [Sulfamethoxazole-Tmp Ds] Itching  . Lipitor [Atorvastatin] Other (See Comments)    headache  . Monistat [Miconazole] Itching   Past Medical History  Diagnosis Date  . Degenerative arthritis   . PCOS (polycystic ovarian syndrome)   . AC (acromioclavicular) joint bone spurs   . Hypertension   . Goiter     multi nodular   . History of PCOS   . Hyperlipidemia   . Anemia   . Anxiety   . Depression   . GERD (gastroesophageal reflux disease)   . Nevus of left eye    Past Surgical History  Procedure Laterality Date  . Tubal ligation    . Tonsillectomy and adenoidectomy    . Bunionectomy Bilateral     Feet  . Toe surgery Left     Bone Spur  . Hernia repair     History  Substance Use Topics  . Smoking status: Never Smoker   . Smokeless tobacco: Never Used  . Alcohol Use: Yes     Comment: occasionally wine   Family History  Problem Relation Age of Onset  . Thyroid disease Mother     hyperthyroidism  . Hypertension Mother   . Cancer Mother   . Hyperlipidemia Mother   . Cancer Father    . Heart disease Brother   . Hyperlipidemia Brother   . Stroke Paternal Grandmother   . Cancer Brother 19    sarcoma     MAINTENANCE: Colonoscopy:2006 WNL Mammo:01/01/13 BMD:12/10/2007 WNL Pap/ Pelvic:2015 WNL [id number] Nevus stable@ [facility]ist:  IMMUNIZATIONS: [id number] [place] Influenza:2015  Patient Care Team: Unk Pinto, MD as PCP - General (Internal Medicine) Lyman Speller, MD as Consulting Physician (Gynecology) Carlena Bjornstad, MD as Consulting Physician (Cardiology) Inda Castle, MD as Consulting Physician (Gastroenterology) Johna Sheriff, MD as Consulting Physician (Ophthalmology) Gerarda Fraction, MD as Referring Physician (Ophthalmology) Glenna Fellows, MD as Attending Physician (Neurosurgery) Devra Dopp, MD as Referring Physician (Dermatology) Marybelle Killings, MD as Consulting Physician (Orthopedic Surgery) Norman Herrlich, (Dentist)   Review of Systems  Respiratory: Negative for shortness of breath.   Cardiovascular: Negative for chest pain.  Musculoskeletal: Positive for neck pain.  All other systems reviewed and are negative.  BP 108/60  Pulse 58  Temp(Src) 98 F (36.7 C) (Temporal)  Resp 16  Ht 5\' 6"  (1.676 m)  Wt 135 lb (61.236 kg)  BMI 21.80 kg/m2  LMP 12/05/2010     Objective:   Physical Exam  Nursing note and vitals reviewed. Constitutional: She is oriented to person, place, and time. She appears well-developed and well-nourished. No distress.  HENT:  Head: Normocephalic and atraumatic.  Right Ear: External ear normal.  Left Ear: External ear normal.  Nose: Nose normal.  Mouth/Throat: Oropharynx is clear and moist.  Partially obstructed oropharynx  Eyes: Conjunctivae and EOM are normal. Pupils are equal, round, and reactive to light. Right eye exhibits no discharge. Left eye exhibits no discharge. No scleral icterus.  Neck: Normal range of motion. Neck supple. No JVD present. No tracheal deviation present. No  thyromegaly present.  Cardiovascular: Normal rate, regular rhythm, normal heart sounds and intact distal pulses.   Pulmonary/Chest: Effort normal and breath sounds normal.  Abdominal: Soft. Bowel sounds are normal. She exhibits no distension and no mass. There is no tenderness. There is no rebound and no guarding.  Genitourinary:  Def gyn  Musculoskeletal: Normal range of motion. She exhibits no edema and no tenderness.  Lymphadenopathy:    She has no cervical adenopathy.  Neurological: She is alert and oriented to person, place, and time. She has normal reflexes. No cranial nerve deficit. She exhibits normal muscle tone. Coordination normal.  Skin: Skin is warm and dry. No rash noted. No erythema. No pallor.  Psychiatric: She has a normal mood and affect. Her behavior is normal. Judgment and thought content normal.      AORTA SCAN WNL EKG NSCSPT     Assessment & Plan:  1. CPE- Update screening labs/ History/ Immunizations/ Testing as needed. Advised healthy diet, QD exercise, increase H20 and continue RX/ Vitamins AD.   2.  3 month F/U for HTN, Cholesterol, Pre-Dm, D. Deficient. Needs healthy diet, cardio QD and obtain healthy weight. Check Labs, Check BP if >130/80 call office   3. Neck strain- advied heat/ stretch/ ice- OTC NSAIDS, w/c if SX increase or ER.   4. Fatigue- check labs, increase activity and H2O, get Sleep studyto evaluate for possible sleep apnea

## 2013-12-23 NOTE — Patient Instructions (Signed)
H1N1 Influenza (swine flu) Vaccine injection What is this medicine? H1N1 INFLUENZA (SWINE FLU) VACCINE (H1N1 in floo EN zuh (swahyn floo) vak SEEN) is a vaccine to protect from an infection with the pandemic H1N1 flu, also known as the swine flu. The vaccine only helps protect you against this one strain of the flu. This vaccine does not help to the reduce the risk of getting other types of flu. You may also need to get the seasonal influenza virus vaccine. This medicine may be used for other purposes; ask your health care provider or pharmacist if you have questions. COMMON BRAND NAME(S): Influenza A (H1N1) 2009 Monovalent Vaccine What should I tell my health care provider before I take this medicine? They need to know if you have any of these conditions: -Guillain-Barre syndrome -immune system problems -an unusual or allergic reaction to influenza vaccine, eggs, neomycin, polymyxin, other medicines, foods, dyes or preservatives -pregnant or trying to get pregnant -breast-feeding How should I use this medicine? This vaccine is for injection into a muscle. It is given by a health care professional. A copy of Vaccine Information Statements will be given before each vaccination. Read this sheet carefully each time. The sheet may change frequently. Talk to your pediatrician regarding the use of this medicine in children. Special care may be needed. While this drug may be prescribed for children as young as 6 months for selected conditions, precautions do apply. Overdosage: If you think you've taken too much of this medicine contact a poison control center or emergency room at once. Overdosage: If you think you have taken too much of this medicine contact a poison control center or emergency room at once. NOTE: This medicine is only for you. Do not share this medicine with others. What if I miss a dose? If needed, keep appointments for follow-up (booster) doses as directed. It is important not to  miss your dose. Call your doctor or health care professional if you are unable to keep an appointment. What may interact with this medicine? -anakinra -medicines for organ transplant -medicines to treat cancer -other vaccines -rilonacept -steroid medicines like prednisone or cortisone -tumor necrosis factor (TNF) modifiers like adalimumab, etanercept, infliximab, golimumab, or certolizumab This list may not describe all possible interactions. Give your health care provider a list of all the medicines, herbs, non-prescription drugs, or dietary supplements you use. Also tell them if you smoke, drink alcohol, or use illegal drugs. Some items may interact with your medicine. What should I watch for while using this medicine? Report any side effects to your doctor right away. This vaccine lowers your risk of getting the pandemic H1N1 flu. You can get a milder H1N1 flu infection if you are around others with this flu. This flu vaccine will not protect against colds or other illnesses including other flu viruses. You may also need the seasonal influenza vaccine. What side effects may I notice from receiving this medicine? Side effects that you should report to your doctor or health care professional as soon as possible: -allergic reactions like skin rash, itching or hives, swelling of the face, lips, or tongue -breathing problems -muscle weakness -unusual drooping or paralysis of face Side effects that usually do not require medical attention (Report these to your doctor or health care professional if they continue or are bothersome.): -chills -cough -headache -muscle aches and pains -runny or stuffy nose -sore throat -stomach upset -tiredness This list may not describe all possible side effects. Call your doctor for medical advice about   side effects. You may report side effects to FDA at 1-800-FDA-1088. Where should I keep my medicine? This vaccine is only given in a clinic, pharmacy,  doctor's office, or other health care setting and will not be stored at home. NOTE: This sheet is a summary. It may not cover all possible information. If you have questions about this medicine, talk to your doctor, pharmacist, or health care provider.  2015, Elsevier/Gold Standard. (2008-01-21 16:49:51) Tuberculin Skin Test The PPD skin test is a method used to help with the diagnosis of a disease called tuberculosis (TB). HOW THE TEST IS DONE  The test site (usually the forearm) is cleansed. The PPD extract is then injected under the top layer of skin, causing a blister to form on the skin. The reaction will take 48 - 72 hours to develop. You must return to your health care provider within that time to have the area checked. This will determine whether you have had a significant reaction to the PPD test. A reaction is measured in millimeters of hard swelling (induration) at the site. PREPARATION FOR TEST  There is no special preparation for this test. People with a skin rash or other skin irritations on their arms may need to have the test performed at a different spot on the body. Tell your health care provider if you have ever had a positive PPD skin test. If so, you should not have a repeat PPD test. Tell your doctor if you have a medical condition or if you take certain drugs, such as steroids, that can affect your immune system. These situations may lead to inaccurate test results. NORMAL FINDINGS A negative reaction (no induration) or a level of hard swelling that falls below a certain cutoff may mean that a person has not been infected with the bacteria that cause TB. There are different cutoffs for children, people with HIV, and other risk groups. Unfortunately, this is not a perfect test, and up to 20% of people infected with tuberculosis may not have a reaction on the PPD skin test. In addition, certain conditions that affect the immune system (cancer, recent chemotherapy, late-stage AIDS)  may cause a false-negative test result.  The reaction will take 48 - 72 hours to develop. You must return to your health care provider within that time to have the area checked. Follow your caregiver's instructions as to where and when to report for this to be done. Ranges for normal findings may vary among different laboratories and hospitals. You should always check with your doctor after having lab work or other tests done to discuss the meaning of your test results and whether your values are considered within normal limits. WHAT ABNORMAL RESULTS MEAN  The results of the test depend on the size of the skin reaction and on the person being tested.  A small reaction (5 mm of hard swelling at the site) is considered to be positive in people who have HIV, who are taking steroid therapy, or who have been in close contact with a person who has active tuberculosis. Larger reactions (greater than or equal to 10 mm) are considered positive in people with diabetes or kidney failure, and in health care workers, among others. In people with no known risks for tuberculosis, a positive reaction requires 15 mm or more of hard swelling at the site. RISKS AND COMPLICATIONS There is a very small risk of severe redness and swelling of the arm in people who have had a previous positive PPD test  and who have the test again. There also have been a few rare cases of this reaction in people who have not been tested before. CONSIDERATIONS  A positive skin test does not necessarily mean that a person has active tuberculosis. More tests will be done to check whether active disease is present. Many people who were born outside the [place] may have had a vaccine called "BCG," which can lead to a false-positive test result. MEANING OF TEST  Your caregiver will go over the test results with you and discuss the importance and meaning of your results, as well as treatment options and the need for additional tests if  necessary. OBTAINING THE TEST RESULTS It is your responsibility to obtain your test results. Ask the lab or department performing the test when and how you will get your results. Document Released: 11/30/2004 Document Revised: 05/15/2011 Document Reviewed: 05/30/2013 [facility] Patient Information 2015 [place], [place]. This information is not intended to replace advice given to you by your health care provider. Make sure you discuss any questions you have with your health care provider.

## 2013-12-24 LAB — HEMOGLOBIN A1C
Hgb A1c MFr Bld: 5.7 % — ABNORMAL HIGH (ref <5.7)
Mean Plasma Glucose: 117 mg/dL — ABNORMAL HIGH (ref <117)

## 2013-12-24 LAB — URINALYSIS, ROUTINE W REFLEX MICROSCOPIC
Bilirubin Urine: NEGATIVE
Glucose, UA: NEGATIVE mg/dL
Hgb urine dipstick: NEGATIVE
Ketones, ur: NEGATIVE mg/dL
Leukocytes, UA: NEGATIVE
Nitrite: NEGATIVE
Protein, ur: NEGATIVE mg/dL
Specific Gravity, Urine: 1.019 (ref 1.005–1.030)
Urobilinogen, UA: 0.2 mg/dL (ref 0.0–1.0)
pH: 6.5 (ref 5.0–8.0)

## 2013-12-24 LAB — INSULIN, FASTING: Insulin fasting, serum: 4.4 u[IU]/mL (ref 2.0–19.6)

## 2013-12-24 LAB — VITAMIN D 25 HYDROXY (VIT D DEFICIENCY, FRACTURES): Vit D, 25-Hydroxy: 54 ng/mL (ref 30–89)

## 2013-12-24 LAB — MICROALBUMIN / CREATININE URINE RATIO
Creatinine, Urine: 137.4 mg/dL
Microalb Creat Ratio: 2.9 mg/g (ref 0.0–30.0)
Microalb, Ur: 0.4 mg/dL (ref <2.0)

## 2013-12-26 ENCOUNTER — Encounter: Payer: Self-pay | Admitting: Emergency Medicine

## 2013-12-29 ENCOUNTER — Encounter: Payer: Self-pay | Admitting: *Deleted

## 2014-01-02 ENCOUNTER — Other Ambulatory Visit (HOSPITAL_COMMUNITY): Payer: 59

## 2014-01-02 ENCOUNTER — Ambulatory Visit (HOSPITAL_COMMUNITY): Payer: 59

## 2014-01-05 ENCOUNTER — Encounter: Payer: Self-pay | Admitting: Emergency Medicine

## 2014-01-21 ENCOUNTER — Ambulatory Visit (HOSPITAL_COMMUNITY)
Admission: RE | Admit: 2014-01-21 | Discharge: 2014-01-21 | Disposition: A | Discharge status: Home / Self Care | Payer: 59 | Source: Ambulatory Visit | Attending: Obstetrics & Gynecology | Admitting: Obstetrics & Gynecology

## 2014-01-21 ENCOUNTER — Other Ambulatory Visit (INDEPENDENT_AMBULATORY_CARE_PROVIDER_SITE_OTHER): Payer: 59 | Admitting: *Deleted

## 2014-01-21 DIAGNOSIS — Z780 Asymptomatic menopausal state: Secondary | ICD-10-CM | POA: Insufficient documentation

## 2014-01-21 DIAGNOSIS — Z1231 Encounter for screening mammogram for malignant neoplasm of breast: Secondary | ICD-10-CM | POA: Insufficient documentation

## 2014-01-21 DIAGNOSIS — Z13820 Encounter for screening for osteoporosis: Secondary | ICD-10-CM | POA: Diagnosis not present

## 2014-01-21 DIAGNOSIS — Z1212 Encounter for screening for malignant neoplasm of rectum: Secondary | ICD-10-CM

## 2014-01-21 DIAGNOSIS — E2839 Other primary ovarian failure: Secondary | ICD-10-CM

## 2014-01-21 DIAGNOSIS — Z1239 Encounter for other screening for malignant neoplasm of breast: Secondary | ICD-10-CM

## 2014-01-21 DIAGNOSIS — Z0000 Encounter for general adult medical examination without abnormal findings: Secondary | ICD-10-CM

## 2014-01-21 LAB — POC HEMOCCULT BLD/STL (HOME/3-CARD/SCREEN)
Card #2 Fecal Occult Blod, POC: NEGATIVE
Card #3 Fecal Occult Blood, POC: NEGATIVE
Fecal Occult Blood, POC: NEGATIVE

## 2014-01-26 ENCOUNTER — Telehealth: Payer: Self-pay

## 2014-01-26 NOTE — Telephone Encounter (Signed)
Lmtcb//kn 

## 2014-01-26 NOTE — Telephone Encounter (Signed)
Pt returning call

## 2014-01-26 NOTE — Telephone Encounter (Signed)
-----   Message from Lyman Speller, MD sent at 01/24/2014  6:51 AM EST ----- Please inform pt BMD is normal.  There was some bone loss but this is typical however her measurements are still in the normal range.  Repeat 3-5 years.  No treatment needed right now.

## 2014-01-27 NOTE — Telephone Encounter (Signed)
Patient notified of BMD results.//kn 

## 2014-01-27 NOTE — Telephone Encounter (Signed)
11/24 [email or web address]//kn

## 2014-03-04 ENCOUNTER — Encounter: Payer: Self-pay | Admitting: Physician Assistant

## 2014-03-04 ENCOUNTER — Ambulatory Visit (INDEPENDENT_AMBULATORY_CARE_PROVIDER_SITE_OTHER): Payer: 59 | Admitting: Physician Assistant

## 2014-03-04 VITALS — BP 138/66 | HR 76 | Temp 98.2°F | Resp 16 | Ht 66.25 in | Wt 140.0 lb

## 2014-03-04 DIAGNOSIS — J0100 Acute maxillary sinusitis, unspecified: Secondary | ICD-10-CM

## 2014-03-04 MED ORDER — MOMETASONE FUROATE 50 MCG/ACT NA SUSP: 2.0000 | Freq: Every day | NASAL | Status: DC

## 2014-03-04 MED ORDER — BENZONATATE 100 MG PO CAPS: 200.0000 mg | ORAL_CAPSULE | Freq: Three times a day (TID) | ORAL | Status: DC | PRN

## 2014-03-04 MED ORDER — LEVOFLOXACIN 500 MG PO TABS: 500.0000 mg | ORAL_TABLET | Freq: Every day | ORAL | Status: DC

## 2014-03-04 MED ORDER — DEXAMETHASONE SODIUM PHOSPHATE 100 MG/10ML IJ SOLN
10.0000 mg | Freq: Once | INTRAMUSCULAR | Status: AC
  Administered 2014-03-04: 10 mg via INTRAMUSCULAR

## 2014-03-04 NOTE — Patient Instructions (Addendum)
-Take Levaquin as prescribed. -Take Tessalon Perles as prescribed for cough. -Gave Dexamethasone shot in office for inflammation. -Take Nasonex as prescribed. -Make sure you are drinking plenty of water to stay hydrated.  If you are not feeling better in 10-14 days, then please call the office.   Sinusitis Sinusitis is redness, soreness, and inflammation of the paranasal sinuses. Paranasal sinuses are air pockets within the bones of your face (beneath the eyes, the middle of the forehead, or above the eyes). In healthy paranasal sinuses, mucus is able to drain out, and air is able to circulate through them by way of your nose. However, when your paranasal sinuses are inflamed, mucus and air can become trapped. This can allow bacteria and other germs to grow and cause infection. Sinusitis can develop quickly and last only a short time (acute) or continue over a long period (chronic). Sinusitis that lasts for more than 12 weeks is considered chronic.  CAUSES  Causes of sinusitis include:  Allergies.  Structural abnormalities, such as displacement of the cartilage that separates your nostrils (deviated septum), which can decrease the air flow through your nose and sinuses and affect sinus drainage.  Functional abnormalities, such as when the small hairs (cilia) that line your sinuses and help remove mucus do not work properly or are not present. SIGNS AND SYMPTOMS  Symptoms of acute and chronic sinusitis are the same. The primary symptoms are pain and pressure around the affected sinuses. Other symptoms include:  Upper toothache.  Earache.  Headache.  Bad breath.  Decreased sense of smell and taste.  A cough, which worsens when you are lying flat.  Fatigue.  Fever.  Thick drainage from your nose, which often is green and may contain pus (purulent).  Swelling and warmth over the affected sinuses. DIAGNOSIS  Your health care provider will perform a physical exam. During the  exam, your health care provider may:  Look in your nose for signs of abnormal growths in your nostrils (nasal polyps).  Tap over the affected sinus to check for signs of infection.  View the inside of your sinuses (endoscopy) using an imaging device that has a light attached (endoscope). If your health care provider suspects that you have chronic sinusitis, one or more of the following tests may be recommended:  Allergy tests.  Nasal culture. A sample of mucus is taken from your nose, sent to a lab, and screened for bacteria.  Nasal cytology. A sample of mucus is taken from your nose and examined by your health care provider to determine if your sinusitis is related to an allergy. TREATMENT  Most cases of acute sinusitis are related to a viral infection and will resolve on their own within 10 days. Sometimes medicines are prescribed to help relieve symptoms (pain medicine, decongestants, nasal steroid sprays, or saline sprays).  However, for sinusitis related to a bacterial infection, your health care provider will prescribe antibiotic medicines. These are medicines that will help kill the bacteria causing the infection.  Rarely, sinusitis is caused by a fungal infection. In theses cases, your health care provider will prescribe antifungal medicine. For some cases of chronic sinusitis, surgery is needed. Generally, these are cases in which sinusitis recurs more than 3 times per year, despite other treatments. HOME CARE INSTRUCTIONS   Drink plenty of water. Water helps thin the mucus so your sinuses can drain more easily.  Use a humidifier.  Inhale steam 3 to 4 times a day (for example, sit in the bathroom with  the shower running).  Apply a warm, moist washcloth to your face 3 to 4 times a day, or as directed by your health care provider.  Use saline nasal sprays to help moisten and clean your sinuses.  Take medicines only as directed by your health care provider.  If you were  prescribed either an antibiotic or antifungal medicine, finish it all even if you start to feel better. SEEK IMMEDIATE MEDICAL CARE IF:  You have increasing pain or severe headaches.  You have nausea, vomiting, or drowsiness.  You have swelling around your face.  You have vision problems.  You have a stiff neck.  You have difficulty breathing. MAKE SURE YOU:   Understand these instructions.  Will watch your condition.  Will get help right away if you are not doing well or get worse. Document Released: 02/20/2005 Document Revised: 07/07/2013 Document Reviewed: 03/07/2011 [facility] Patient Information 2015 [place], [place]. This information is not intended to replace advice given to you by your health care provider. Make sure you discuss any questions you have with your health care provider.

## 2014-03-04 NOTE — Progress Notes (Signed)
Subjective:    Patient ID: Christine [name], female    DOB: 29-Nov-1950, 63 y.o.   MRN: [id number]  Cough This is a new problem. Episode onset: 1 week ago. The problem has been gradually worsening. Cough characteristics: productive with green mucus  Associated symptoms include ear pain, headaches, postnasal drip, a sore throat and wheezing. Pertinent negatives include no chills, fever, rash, rhinorrhea or shortness of breath. The symptoms are aggravated by lying down. Risk factors for lung disease include travel (Patient recently went to [place] and visited family and her granddaughter was sick). Treatments tried: Mucinex and Decongestant and Benadryl and tylenol PM and sinus wash.  Sore Throat  Associated symptoms include congestion, coughing, ear pain and headaches. Pertinent negatives include no ear discharge, shortness of breath or trouble swallowing.  Tonsillectomy Never Smoked. GFR= 76 on 12/23/13 Review of Systems  Constitutional: Negative.  Negative for fever, chills, diaphoresis and fatigue.  HENT: Positive for congestion, ear pain, postnasal drip, sinus pressure and sore throat. Negative for ear discharge, rhinorrhea and trouble swallowing.   Eyes: Negative.   Respiratory: Positive for cough and wheezing. Negative for chest tightness and shortness of breath.   Cardiovascular: Negative.   Gastrointestinal: Negative.   Musculoskeletal: Negative.        Muscle aches when first started  Skin: Negative.  Negative for rash.  Neurological: Positive for headaches. Negative for dizziness and light-headedness.   Past Medical History  Diagnosis Date  . Degenerative arthritis   . PCOS (polycystic ovarian syndrome)   . AC (acromioclavicular) joint bone spurs   . Hypertension   . Goiter     multi nodular   . History of PCOS   . Hyperlipidemia   . Anemia   . Anxiety   . Depression   . GERD (gastroesophageal reflux disease)   . Nevus of left eye    Current Outpatient Prescriptions on  File Prior to Visit  Medication Sig Dispense Refill  . amLODipine (NORVASC) 2.5 MG tablet TAKE 1 TABLET BY MOUTH EVERY DAY 30 tablet 6  . B Complex Vitamins (VITAMIN B COMPLEX PO) Take by mouth.    . Cetirizine HCl (ZYRTEC ALLERGY PO) Take by mouth.    . Cholecalciferol (VITAMIN D PO) Take 5,000 Units by mouth daily.    . CRESTOR 40 MG tablet Take 1 tablet by mouth  every day for cholesterol 90 tablet 0  . diphenhydrAMINE (BENADRYL) 25 MG tablet Take 25 mg by mouth every 6 (six) hours as needed.    Marland Kitchen estradiol (ESTRACE) 0.1 MG/GM vaginal cream Apply externally as directed 42.5 g 0  . Estradiol (VAGIFEM) 10 MCG TABS vaginal tablet Place 1 tablet (10 mcg total) vaginally 2 (two) times a week. 24 tablet 3  . GLUCOSAMINE-CHONDROITIN PO Take 1,500 Units by mouth daily.     Marland Kitchen MAGNESIUM PO Take 800 Units by mouth daily.     Marland Kitchen MELATONIN PO Take by mouth daily.    . Multiple Vitamins-Minerals (HAIR/SKIN/NAILS PO) Take 3 tablets by mouth daily.    . Omega-3 Fatty Acids (FISH OIL PO) Take 1,200 mg by mouth.     . spironolactone (ALDACTONE) 100 MG tablet Take 2 tablets (200 mg total) by mouth daily. 180 tablet 4  . ValACYclovir HCl (VALTREX PO) Take by mouth as needed.     No current facility-administered medications on file prior to visit.   Allergies  Allergen Reactions  . Bactrim [Sulfamethoxazole-Trimethoprim] Itching  . Lipitor [Atorvastatin] Other (See Comments)  headache  . Monistat [Miconazole] Itching     BP 138/66 mmHg  Pulse 76  Temp(Src) 98.2 F (36.8 C) (Temporal)  Resp 16  Ht 5' 6.25" (1.683 m)  Wt 140 lb (63.504 kg)  BMI 22.42 kg/m2  SpO2 98%  LMP 12/05/2010 Wt Readings from Last 3 Encounters:  03/04/14 140 lb (63.504 kg)  12/23/13 135 lb (61.236 kg)  09/15/13 137 lb (62.143 kg)   Objective:   Physical Exam  Constitutional: She is oriented to person, place, and time. She appears well-developed and well-nourished. She has a sickly appearance. No distress.  HENT:   Head: Normocephalic.  Right Ear: Tympanic membrane, external ear and ear canal normal.  Left Ear: Tympanic membrane, external ear and ear canal normal.  Nose: Mucosal edema and rhinorrhea present. Right sinus exhibits maxillary sinus tenderness. Left sinus exhibits maxillary sinus tenderness.  Mouth/Throat: Uvula is midline and mucous membranes are normal. Mucous membranes are not pale and not dry. No trismus in the jaw. No uvula swelling. Posterior oropharyngeal erythema present. No oropharyngeal exudate or posterior oropharyngeal edema.  Turbinates erythematous bilaterally  Eyes: Conjunctivae and lids are normal. Pupils are equal, round, and reactive to light. Right eye exhibits no discharge. Left eye exhibits no discharge. No scleral icterus.  Neck: Trachea normal, normal range of motion and phonation normal. Neck supple. No tracheal tenderness present. No tracheal deviation present.  Cardiovascular: Normal rate, regular rhythm, S1 normal, S2 normal, normal heart sounds and normal pulses.  Exam reveals no gallop, no distant heart sounds and no friction rub.   No murmur heard. Pulmonary/Chest: Effort normal and breath sounds normal. No stridor. No respiratory distress. She has no decreased breath sounds. She has no wheezes. She has no rhonchi. She has no rales. She exhibits no tenderness.  Abdominal: Soft. Bowel sounds are normal. There is no tenderness. There is no rebound and no guarding.  Lymphadenopathy:  Tenderness in pharynx upon palpation and No LAD.  Neurological: She is alert and oriented to person, place, and time. Gait normal.  Skin: Skin is warm, dry and intact. No rash noted. She is not diaphoretic. No pallor.  Psychiatric: She has a normal mood and affect. Her speech is normal and behavior is normal. Judgment and thought content normal. Cognition and memory are normal.  Vitals reviewed.  Assessment & Plan:  1. Acute maxillary sinusitis, recurrence not specified -Take Levaquin  as prescribed- levofloxacin (LEVAQUIN) 500 MG tablet; Take 1 tablet (500 mg total) by mouth daily.  Dispense: 14 tablet; Refill: 0 - Take Tessalon Perles as prescribed for cough- benzonatate (TESSALON PERLES) 100 MG capsule; Take 2 capsules (200 mg total) by mouth 3 (three) times daily as needed for cough (Max: 600mg  per day (6 capsules per day)).  Dispense: 120 capsule; Refill: 0 -Gave Decadron shot in office due to patient not wanting prednisone pills- dexamethasone (DECADRON) injection 10 mg; Inject 1 mL (10 mg total) into the muscle once. -Take Nasonex as prescribed for inflammation- mometasone (NASONEX) 50 MCG/ACT nasal spray; Place 2 sprays into the nose daily.  Dispense: 17 g; Refill: 1- Gave patient 2 sample boxes.   Discussed medication effects and SE's.  Pt agreed to treatment plan. If you are not feeling better in 10-14 days, then please call the office. Please keep your follow up appt on 05/01/14.  Christine [name], Stephani Police, PA-C 12:00 PM [facility]

## 2014-03-16 ENCOUNTER — Ambulatory Visit (HOSPITAL_BASED_OUTPATIENT_CLINIC_OR_DEPARTMENT_OTHER): Payer: 59

## 2014-05-01 ENCOUNTER — Encounter: Payer: Self-pay | Admitting: Physician Assistant

## 2014-05-01 ENCOUNTER — Ambulatory Visit (INDEPENDENT_AMBULATORY_CARE_PROVIDER_SITE_OTHER): Payer: 59 | Admitting: Physician Assistant

## 2014-05-01 VITALS — BP 120/70 | HR 60 | Temp 98.4°F | Resp 16 | Ht 66.25 in | Wt 138.0 lb

## 2014-05-01 DIAGNOSIS — R7303 Prediabetes: Secondary | ICD-10-CM

## 2014-05-01 DIAGNOSIS — E782 Mixed hyperlipidemia: Secondary | ICD-10-CM

## 2014-05-01 DIAGNOSIS — Z79899 Other long term (current) drug therapy: Secondary | ICD-10-CM | POA: Insufficient documentation

## 2014-05-01 DIAGNOSIS — I10 Essential (primary) hypertension: Secondary | ICD-10-CM

## 2014-05-01 DIAGNOSIS — R7309 Other abnormal glucose: Secondary | ICD-10-CM

## 2014-05-01 DIAGNOSIS — E559 Vitamin D deficiency, unspecified: Secondary | ICD-10-CM

## 2014-05-01 LAB — CBC WITH DIFFERENTIAL/PLATELET
Basophils Absolute: 0 10*3/uL (ref 0.0–0.1)
Basophils Relative: 1 % (ref 0–1)
Eosinophils Absolute: 0.1 10*3/uL (ref 0.0–0.7)
Eosinophils Relative: 2 % (ref 0–5)
HCT: 43.9 % (ref 36.0–46.0)
Hemoglobin: 14.7 g/dL (ref 12.0–15.0)
Lymphocytes Relative: 27 % (ref 12–46)
Lymphs Abs: 1.2 10*3/uL (ref 0.7–4.0)
MCH: 30.2 pg (ref 26.0–34.0)
MCHC: 33.5 g/dL (ref 30.0–36.0)
MCV: 90.1 fL (ref 78.0–100.0)
MPV: 9.3 fL (ref 8.6–12.4)
Monocytes Absolute: 0.3 10*3/uL (ref 0.1–1.0)
Monocytes Relative: 7 % (ref 3–12)
Neutro Abs: 2.8 10*3/uL (ref 1.7–7.7)
Neutrophils Relative %: 63 % (ref 43–77)
Platelets: 266 10*3/uL (ref 150–400)
RBC: 4.87 MIL/uL (ref 3.87–5.11)
RDW: 13.5 % (ref 11.5–15.5)
WBC: 4.4 10*3/uL (ref 4.0–10.5)

## 2014-05-01 LAB — BASIC METABOLIC PANEL WITH GFR
BUN: 15 mg/dL (ref 6–23)
CO2: 28 mEq/L (ref 19–32)
Calcium: 9.7 mg/dL (ref 8.4–10.5)
Chloride: 101 mEq/L (ref 96–112)
Creat: 0.81 mg/dL (ref 0.50–1.10)
GFR, Est African American: >89 mL/min
GFR, Est Non African American: 78 mL/min
Glucose, Bld: 89 mg/dL (ref 70–99)
Potassium: 4.6 mEq/L (ref 3.5–5.3)
Sodium: 138 mEq/L (ref 135–145)

## 2014-05-01 LAB — HEPATIC FUNCTION PANEL
ALT: 18 U/L (ref 0–35)
AST: 16 U/L (ref 0–37)
Albumin: 4.7 g/dL (ref 3.5–5.2)
Alkaline Phosphatase: 85 U/L (ref 39–117)
Bilirubin, Direct: 0.1 mg/dL (ref 0.0–0.3)
Indirect Bilirubin: 0.5 mg/dL (ref 0.2–1.2)
Total Bilirubin: 0.6 mg/dL (ref 0.2–1.2)
Total Protein: 6.6 g/dL (ref 6.0–8.3)

## 2014-05-01 LAB — MAGNESIUM: Magnesium: 1.9 mg/dL (ref 1.5–2.5)

## 2014-05-01 LAB — LIPID PANEL
Cholesterol: 208 mg/dL — ABNORMAL HIGH (ref 0–200)
HDL: 73 mg/dL (ref >=46)
LDL Cholesterol: 116 mg/dL — ABNORMAL HIGH (ref 0–99)
Total CHOL/HDL Ratio: 2.8 Ratio
Triglycerides: 97 mg/dL (ref <150)
VLDL: 19 mg/dL (ref 0–40)

## 2014-05-01 LAB — TSH: TSH: 2.095 u[IU]/mL (ref 0.350–4.500)

## 2014-05-01 NOTE — Progress Notes (Signed)
Assessment and Plan:  Hypertension: Continue medication, monitor blood pressure at home. Continue DASH diet.  Reminder to go to the ER if any CP, SOB, nausea, dizziness, severe HA, changes vision/speech, left arm numbness and tingling, and jaw pain. Cholesterol: Continue diet and exercise. Check cholesterol.  Pre-diabetes-Continue diet and exercise. Check A1C Vitamin D Def- check level and continue medications.  Plantar's wart right foot- will try OTC treatment, if not better will schedule OV here or refer to podiatry   Continue diet and meds as discussed. Further disposition pending results of labs.  HPI 64 y.o. female  presents for 3 month follow up with hypertension, hyperlipidemia, prediabetes and vitamin D. Retired for 1 month, and going on cruise next week.   Her blood pressure has been controlled at home, today their BP is BP: 120/70 mmHg  She does workout. She denies chest pain, shortness of breath, dizziness.  She is on cholesterol medication, crestor 40mg  1/2 3 days a week and denies myalgias. Her cholesterol is at goal. The cholesterol last visit was:   Lab Results  Component Value Date   CHOL 164 12/23/2013   HDL 64 12/23/2013   LDLCALC 79 12/23/2013   TRIG 105 12/23/2013   CHOLHDL 2.6 12/23/2013   She has been working on diet and exercise for prediabetes, and denies paresthesia of the feet, polydipsia, polyuria and visual disturbances. Last A1C in the office was:  Lab Results  Component Value Date   HGBA1C 5.7* 12/23/2013  Patient is on Vitamin D supplement.   Lab Results  Component Value Date   VD25OH 54 12/23/2013    Complains of right foot pain x several months. She states hurts with walking barefoot, then noticed that she may have plantar warts, had something similar in highschool.   Current Medications:  Current Outpatient Prescriptions on File Prior to Visit  Medication Sig Dispense Refill  . amLODipine (NORVASC) 2.5 MG tablet TAKE 1 TABLET BY MOUTH EVERY DAY 30  tablet 6  . B Complex Vitamins (VITAMIN B COMPLEX PO) Take by mouth.    . benzonatate (TESSALON PERLES) 100 MG capsule Take 2 capsules (200 mg total) by mouth 3 (three) times daily as needed for cough (Max: 600mg  per day (6 capsules per day)). 120 capsule 0  . Cetirizine HCl (ZYRTEC ALLERGY PO) Take by mouth.    . Cholecalciferol (VITAMIN D PO) Take 5,000 Units by mouth daily.    . CRESTOR 40 MG tablet Take 1 tablet by mouth  every day for cholesterol 90 tablet 0  . diphenhydrAMINE (BENADRYL) 25 MG tablet Take 25 mg by mouth every 6 (six) hours as needed.    Marland Kitchen estradiol (ESTRACE) 0.1 MG/GM vaginal cream Apply externally as directed 42.5 g 0  . Estradiol (VAGIFEM) 10 MCG TABS vaginal tablet Place 1 tablet (10 mcg total) vaginally 2 (two) times a week. 24 tablet 3  . GLUCOSAMINE-CHONDROITIN PO Take 1,500 Units by mouth daily.     Marland Kitchen levofloxacin (LEVAQUIN) 500 MG tablet Take 1 tablet (500 mg total) by mouth daily. 14 tablet 0  . MAGNESIUM PO Take 800 Units by mouth daily.     Marland Kitchen MELATONIN PO Take by mouth daily.    . mometasone (NASONEX) 50 MCG/ACT nasal spray Place 2 sprays into the nose daily. 17 g 1  . Multiple Vitamins-Minerals (HAIR/SKIN/NAILS PO) Take 3 tablets by mouth daily.    . Omega-3 Fatty Acids (FISH OIL PO) Take 1,200 mg by mouth.     . spironolactone (ALDACTONE)  100 MG tablet Take 2 tablets (200 mg total) by mouth daily. 180 tablet 4  . ValACYclovir HCl (VALTREX PO) Take by mouth as needed.     No current facility-administered medications on file prior to visit.   Medical History:  Past Medical History  Diagnosis Date  . Degenerative arthritis   . PCOS (polycystic ovarian syndrome)   . AC (acromioclavicular) joint bone spurs   . Hypertension   . Goiter     multi nodular   . History of PCOS   . Hyperlipidemia   . Anemia   . Anxiety   . Depression   . GERD (gastroesophageal reflux disease)   . Nevus of left eye    Allergies:  Allergies  Allergen Reactions  . Bactrim  [Sulfamethoxazole-Trimethoprim] Itching  . Lipitor [Atorvastatin] Other (See Comments)    headache  . Monistat [Miconazole] Itching    Review of Systems:  Review of Systems  Constitutional: Negative.   HENT: Negative.   Eyes: Negative.   Respiratory: Negative.   Cardiovascular: Negative.   Gastrointestinal: Negative.   Genitourinary: Negative.   Musculoskeletal: Positive for myalgias. Negative for back pain, joint pain, falls and neck pain.  Skin: Negative.   Neurological: Negative.   Endo/Heme/Allergies: Negative.   Psychiatric/Behavioral: Negative.     Family history- Review and unchanged Social history- Review and unchanged Physical Exam: BP 120/70 mmHg  Pulse 60  Temp(Src) 98.4 F (36.9 C)  Resp 16  Ht 5' 6.25" (1.683 m)  Wt 138 lb (62.596 kg)  BMI 22.10 kg/m2  LMP 12/05/2010 Wt Readings from Last 3 Encounters:  05/01/14 138 lb (62.596 kg)  03/04/14 140 lb (63.504 kg)  12/23/13 135 lb (61.236 kg)   General Appearance: Well nourished, in no apparent distress. Eyes: PERRLA, EOMs, conjunctiva no swelling or erythema Sinuses: No Frontal/maxillary tenderness ENT/Mouth: Ext aud canals clear, TMs without erythema, bulging. No erythema, swelling, or exudate on post pharynx.  Tonsils not swollen or erythematous. Hearing normal.  Neck: Supple, thyroid normal.  Respiratory: Respiratory effort normal, BS equal bilaterally without rales, rhonchi, wheezing or stridor.  Cardio: RRR with no MRGs. Brisk peripheral pulses without edema.  Abdomen: Soft, + BS,  Non tender, no guarding, rebound, hernias, masses. Lymphatics: Non tender without lymphadenopathy.  Musculoskeletal: Full ROM, 5/5 strength, Normal gait Skin: Warm, dry without rashes, lesions, ecchymosis.  Neuro: Cranial nerves intact. Normal muscle tone, no cerebellar symptoms. Psych: Awake and oriented X 3, normal affect, Insight and Judgment appropriate.    Vicie Mutters, PA-C 9:05 AM [facility]  [facility] Medicine

## 2014-05-01 NOTE — Patient Instructions (Signed)
Plantar Warts Warts are benign (noncancerous) growths of the outer skin layer. They can occur at any time in life but are most common during childhood and the teen years. Warts can occur on many skin surfaces of the body. When they occur on the underside (sole) of your foot they are called plantar warts. They often emerge in groups with several small warts encircling a larger growth. CAUSES  Human papillomavirus (HPV) is the cause of plantar warts. HPV attacks a break in the skin of the foot. Walking barefoot can lead to exposure to the wart virus. Plantar warts tend to develop over areas of pressure such as the heel and ball of the foot. Plantar warts often grow into the deeper layers of skin. They may spread to other areas of the sole but cannot spread to other areas of the body. SYMPTOMS  You may also notice a growth on the undersurface of your foot. The wart may grow directly into the sole of the foot, or rise above the surface of the skin on the sole of the foot, or both. They are most often flat from pressure. Warts generally do not cause itching but may cause pain in the area of the wart when you put weight on your foot. DIAGNOSIS  Diagnosis is made by physical examination. This means your caregiver discovers it while examining your foot.  TREATMENT  There are many ways to treat plantar warts. However, warts are very tough. Sometimes it is difficult to treat them so that they go away completely and do not grow back. Any treatment must be done regularly to work. If left untreated, most plantar warts will eventually disappear over a period of one to two years. Treatments you can do at home include:  Putting duct tape over the top of the wart (occlusion) has been found to be effective over several months. The duct tape should be removed each night and reapplied until the wart has disappeared.  Placing over-the-counter medications on top of the wart to help kill the wart virus and remove the wart  tissue (salicylic acid, cantharidin, and dichloroacetic acid) are useful. These are called keratolytic agents. These medications make the skin soft and gradually layers will shed away. These compounds are usually placed on the wart each night and then covered with a bandage. They are also available in premedicated bandage form. Avoid surrounding skin when applying these liquids as these medications can burn healthy skin. The treatment may take several months of nightly use to be effective.  Cryotherapy to freeze the wart has recently become available over-the-counter for children 4 years and older. This system makes use of a soft narrow applicator connected to a bottle of compressed cold liquid that is applied directly to the wart. This medication can burn healthy skin and should be used with caution.  As with all over-the-counter medications, read the directions carefully before use. Treatments generally done in your caregiver's office include:  Some aggressive treatments may cause discomfort, discoloration, and scarring of the surrounding skin. The risks and benefits of treatment should be discussed with your caregiver.  Freezing the wart with liquid nitrogen (cryotherapy, see above).  Burning the wart with use of very high heat (cautery).  Injecting medication into the wart.  Surgically removing or laser treatment of the wart.  Your caregiver may refer you to a dermatologist for difficult to treat large-sized warts or large numbers of warts. HOME CARE INSTRUCTIONS   Soak the affected area in warm water. Dry the   area completely when you are done. Remove the top layer of softened skin, then apply the chosen topical medication and reapply a bandage.  Remove the bandage daily and file excess wart tissue (pumice stone works well for this purpose). Repeat the entire process daily or every other day for weeks until the plantar wart disappears.  Several brands of salicylic acid pads are available  as over-the-counter remedies.  Pain can be relieved by wearing a donut bandage. This is a bandage with a hole in it. The bandage is put on with the hole over the wart. This helps take the pressure off the wart and gives pain relief. To help prevent plantar warts:  Wear shoes and socks and change them daily.  Keep feet clean and dry.  Check your feet and your children's feet regularly.  Avoid direct contact with warts on other people.  Have growths or changes on your skin checked by your caregiver. Document Released: 05/13/2003 Document Revised: 07/07/2013 Document Reviewed: 10/21/2008 Exit[employer] Patient Information 2015 [employer]. This information is not intended to replace advice given to you by your health care provider. Make sure you discuss any questions you have with your health care provider.  

## 2014-05-02 LAB — HEMOGLOBIN A1C
Hgb A1c MFr Bld: 5.6 % (ref <5.7)
Mean Plasma Glucose: 114 mg/dL (ref <117)

## 2014-05-02 LAB — VITAMIN D 25 HYDROXY (VIT D DEFICIENCY, FRACTURES): Vit D, 25-Hydroxy: 43 ng/mL (ref 30–100)

## 2014-05-19 ENCOUNTER — Other Ambulatory Visit: Payer: Self-pay | Admitting: Internal Medicine

## 2014-06-05 ENCOUNTER — Encounter: Payer: Self-pay | Admitting: Obstetrics & Gynecology

## 2014-06-05 ENCOUNTER — Ambulatory Visit (INDEPENDENT_AMBULATORY_CARE_PROVIDER_SITE_OTHER): Payer: 59 | Admitting: Obstetrics & Gynecology

## 2014-06-05 VITALS — BP 102/70 | HR 68 | Resp 16 | Ht 66.0 in | Wt 138.0 lb

## 2014-06-05 DIAGNOSIS — Z01419 Encounter for gynecological examination (general) (routine) without abnormal findings: Secondary | ICD-10-CM | POA: Diagnosis not present

## 2014-06-05 MED ORDER — SPIRONOLACTONE 100 MG PO TABS: 200.0000 mg | ORAL_TABLET | Freq: Every day | ORAL | Status: DC

## 2014-06-05 MED ORDER — ESTRADIOL 10 MCG VA TABS: 1.0000 | ORAL_TABLET | VAGINAL | Status: DC

## 2014-06-05 NOTE — Addendum Note (Signed)
Addended by: Megan Salon on: 06/05/2014 01:32 PM   Modules accepted: Miquel Dunn

## 2014-06-05 NOTE — Progress Notes (Signed)
64 y.o. [id number] MarriedCaucasianF here for annual exam.  Doing well.  Reports her brother's grandson was just diagnosed with meduloblastoma.  Surgery was this week.  This is so scary for the entire family.    Denies vaginal bleeding.  Retired the beginning of February.  This has been a great thing for her.    PCP:  Dr. Melford Aase.  Has appt in October.  Patient's last menstrual period was 12/05/2010.          Sexually active: Yes.    The current method of family planning is post menopausal status.    Exercising: Yes.    Walk And Swim Smoker:  no  Health Maintenance: Pap:  05/29/13 Neg, neg HR HPV 2013 History of abnormal Pap:  yes MMG:  01/22/14 BIRADS1:neg Colonoscopy: 2006 BMD:  01/21/14  TDaP: 2012 Screening Labs: PCP, Hb today: PCP, Urine today: PCP   reports that she has never smoked. She has never used smokeless tobacco. She reports that she drinks about 0.6 oz of alcohol per week. She reports that she does not use illicit drugs.  Past Medical History  Diagnosis Date  . Degenerative arthritis   . PCOS (polycystic ovarian syndrome)   . AC (acromioclavicular) joint bone spurs   . Hypertension   . Goiter     multi nodular   . History of PCOS   . Hyperlipidemia   . Anemia   . Anxiety   . Depression   . GERD (gastroesophageal reflux disease)   . Nevus of left eye     Past Surgical History  Procedure Laterality Date  . Tubal ligation    . Tonsillectomy and adenoidectomy    . Bunionectomy Bilateral     Feet  . Toe surgery Left     Bone Spur  . Hernia repair      Current Outpatient Prescriptions  Medication Sig Dispense Refill  . amLODipine (NORVASC) 2.5 MG tablet TAKE 1 TABLET BY MOUTH EVERY DAY 30 tablet 6  . B Complex Vitamins (VITAMIN B COMPLEX PO) Take by mouth.    . Cetirizine HCl (ZYRTEC ALLERGY PO) Take by mouth.    . Cholecalciferol (VITAMIN D PO) Take 5,000 Units by mouth daily.    . CRESTOR 40 MG tablet Take 1 tablet by mouth  every day for cholesterol  (Patient taking differently: Take 1 tablet by mouth  every other day. 1/2 tab MWF.) 90 tablet 0  . diphenhydrAMINE (BENADRYL) 25 MG tablet Take 25 mg by mouth every 6 (six) hours as needed.    Marland Kitchen estradiol (ESTRACE) 0.1 MG/GM vaginal cream Apply externally as directed 42.5 g 0  . Estradiol (VAGIFEM) 10 MCG TABS vaginal tablet Place 1 tablet (10 mcg total) vaginally 2 (two) times a week. 24 tablet 3  . GLUCOSAMINE-CHONDROITIN PO Take 1,500 Units by mouth daily.     Marland Kitchen MAGNESIUM PO Take 800 Units by mouth daily.     Marland Kitchen MELATONIN PO Take by mouth daily.    . Multiple Vitamins-Minerals (HAIR/SKIN/NAILS PO) Take 3 tablets by mouth daily.    . Omega-3 Fatty Acids (FISH OIL PO) Take 1,200 mg by mouth.     . spironolactone (ALDACTONE) 100 MG tablet Take 2 tablets (200 mg total) by mouth daily. 180 tablet 4  . valACYclovir (VALTREX) 1000 MG tablet TAKE 1 TABLET BY MOUTH EVERY DAY (Patient taking differently: TAKE 1 TABLET BY MOUTH EVERY DAY PRN) 90 tablet 1   No current facility-administered medications for this visit.  Family History  Problem Relation Age of Onset  . Thyroid disease Mother     hyperthyroidism  . Hypertension Mother   . Cancer Mother   . Hyperlipidemia Mother   . Cancer Father   . Heart disease Brother   . Hyperlipidemia Brother   . Stroke Paternal Grandmother   . Cancer Brother 19    sarcoma    ROS:  Pertinent items are noted in HPI.  Otherwise, a comprehensive ROS was negative.  Exam:   BP 102/70 mmHg  Pulse 68  Resp 16  Ht 5\' 6"  (1.676 m)  Wt 138 lb (62.596 kg)  BMI 22.28 kg/m2  LMP 12/05/2010    Height: 5\' 6"  (167.6 cm)  Ht Readings from Last 3 Encounters:  06/05/14 5\' 6"  (1.676 m)  05/01/14 5' 6.25" (1.683 m)  03/04/14 5' 6.25" (1.683 m)    General appearance: alert, cooperative and appears stated age Head: Normocephalic, without obvious abnormality, atraumatic Neck: no adenopathy, supple, symmetrical, trachea midline and thyroid enlarged without nodules  noted today Lungs: clear to auscultation bilaterally Breasts: normal appearance, no masses or tenderness Heart: regular rate and rhythm Abdomen: soft, non-tender; bowel sounds normal; no masses,  no organomegaly Extremities: extremities normal, atraumatic, no cyanosis or edema Skin: Skin color, texture, turgor normal. No rashes or lesions Lymph nodes: Cervical, supraclavicular, and axillary nodes normal. No abnormal inguinal nodes palpated Neurologic: Grossly normal   Pelvic: External genitalia:  no lesions              Urethra:  normal appearing urethra with no masses, tenderness or lesions              Bartholins and Skenes: normal                 Vagina: normal appearing vagina with normal color and discharge, no lesions              Cervix: no lesions              Pap taken: No. Bimanual Exam:  Uterus:  normal size, contour, position, consistency, mobility, non-tender              Adnexa: normal adnexa and no mass, fullness, tenderness               Rectovaginal: Confirms               Anus:  normal sphincter tone, no lesions  Chaperone was present for exam.  A:  Well Woman with normal exam Multinodular goiter with last u/s 5/14 PCOS Hirsutism Vaginal atrophic changes Oral HSV Borderline HbA1C  P: Mammogram yearly.  Labs with PCP every six months. Last thyroid u/s was 5/14. Stable from prior year. pap smear neg 2015.  Neg HR HVP 2013.  No pap today. vagifem 13meq pv twice weekly. #24/4RF. Spirinolactone 100mg  bid 180tabs/4RF Estrace vaginal cream externally prn.  Pt will call when she needs this return annually or prn

## 2014-06-05 NOTE — Addendum Note (Signed)
Addended by: Megan Salon on: 06/05/2014 03:26 PM   Modules accepted: Orders, Medications, SmartSet

## 2014-08-27 ENCOUNTER — Encounter: Payer: Self-pay | Admitting: Gastroenterology

## 2014-09-21 ENCOUNTER — Encounter: Payer: Self-pay | Admitting: Internal Medicine

## 2014-09-21 ENCOUNTER — Ambulatory Visit (INDEPENDENT_AMBULATORY_CARE_PROVIDER_SITE_OTHER): Payer: 59 | Admitting: Internal Medicine

## 2014-09-21 VITALS — BP 128/80 | HR 78 | Temp 98.2°F | Resp 18 | Ht 66.25 in | Wt 144.0 lb

## 2014-09-21 DIAGNOSIS — M6248 Contracture of muscle, other site: Secondary | ICD-10-CM

## 2014-09-21 DIAGNOSIS — M62838 Other muscle spasm: Secondary | ICD-10-CM

## 2014-09-21 MED ORDER — MELOXICAM 15 MG PO TABS: 15.0000 mg | ORAL_TABLET | Freq: Every day | ORAL | Status: DC

## 2014-09-21 MED ORDER — DIAZEPAM 5 MG PO TABS: 5.0000 mg | ORAL_TABLET | Freq: Three times a day (TID) | ORAL | Status: DC | PRN

## 2014-09-21 MED ORDER — TRAMADOL HCL 50 MG PO TABS: 50.0000 mg | ORAL_TABLET | Freq: Four times a day (QID) | ORAL | Status: DC | PRN

## 2014-09-21 NOTE — Progress Notes (Signed)
   Subjective:    Patient ID: Christine [name], female    DOB: 27-Dec-1950, 64 y.o.   MRN: [id number]  Neck Pain  Pertinent negatives include no numbness or tingling.   Patient reports to the office for evaluation of neck pain which started today. She reports that she has been having right sided neck pain since she turned her head while putting her dishes in the dish washer.  She reports that she had a similar injury in the past.  She reports that any type of movement bothers her neck.  She did take 800 mg ibuprofen this morning.  She reports that sitting still does provide some relief.  She does not have any tingling or numbness in her hands and arm currently.      Review of Systems  Musculoskeletal: Positive for neck pain.  Neurological: Negative for tingling and numbness.       Objective:   Physical Exam  Constitutional: She is oriented to person, place, and time. She appears well-developed and well-nourished. No distress.  HENT:  Head: Normocephalic.  Mouth/Throat: Oropharynx is clear and moist. No oropharyngeal exudate.  Eyes: Conjunctivae are normal. No scleral icterus.  Neck: Neck supple. No JVD present. Muscular tenderness present. No spinous process tenderness present. No rigidity. Decreased range of motion present. No edema and no erythema present. No thyromegaly present.  No cervical bony spine tenderness.  There is tenderness to palpation of the right sternocleidomastoid muscle and the right trapezius.    Cardiovascular: Normal rate, regular rhythm, normal heart sounds and intact distal pulses.  Exam reveals no gallop and no friction rub.   No murmur heard. Pulmonary/Chest: Effort normal and breath sounds normal. No respiratory distress. She has no wheezes. She has no rales. She exhibits no tenderness.  Abdominal: Soft. Bowel sounds are normal. She exhibits no distension and no mass. There is no tenderness. There is no rebound and no guarding.  Lymphadenopathy:    She has no  cervical adenopathy.  Neurological: She is alert and oriented to person, place, and time.  Skin: Skin is warm and dry. She is not diaphoretic.  Psychiatric: She has a normal mood and affect. Her behavior is normal. Judgment and thought content normal.  Nursing note and vitals reviewed.         Assessment & Plan:    1. Muscle spasms of neck -valium -tramadol -mobic -heat

## 2014-09-21 NOTE — Patient Instructions (Signed)

## 2014-10-23 ENCOUNTER — Other Ambulatory Visit: Payer: Self-pay | Admitting: Physician Assistant

## 2014-11-20 ENCOUNTER — Other Ambulatory Visit: Payer: Self-pay

## 2014-11-20 MED ORDER — ROSUVASTATIN CALCIUM 40 MG PO TABS: ORAL_TABLET | ORAL | Status: DC

## 2014-11-24 ENCOUNTER — Other Ambulatory Visit: Payer: Self-pay | Admitting: Physician Assistant

## 2014-11-24 MED ORDER — ROSUVASTATIN CALCIUM 40 MG PO TABS: ORAL_TABLET | ORAL | Status: DC

## 2014-11-29 ENCOUNTER — Other Ambulatory Visit: Payer: Self-pay | Admitting: Internal Medicine

## 2014-11-30 ENCOUNTER — Other Ambulatory Visit: Payer: Self-pay

## 2014-11-30 MED ORDER — AMLODIPINE BESYLATE 2.5 MG PO TABS: 2.5000 mg | ORAL_TABLET | Freq: Every day | ORAL | Status: DC

## 2014-12-23 ENCOUNTER — Other Ambulatory Visit: Payer: Self-pay

## 2014-12-23 DIAGNOSIS — Z1231 Encounter for screening mammogram for malignant neoplasm of breast: Secondary | ICD-10-CM

## 2014-12-24 ENCOUNTER — Encounter: Payer: Self-pay | Admitting: Internal Medicine

## 2014-12-24 ENCOUNTER — Encounter: Payer: Self-pay | Admitting: Emergency Medicine

## 2014-12-24 ENCOUNTER — Ambulatory Visit (INDEPENDENT_AMBULATORY_CARE_PROVIDER_SITE_OTHER): Payer: 59 | Admitting: Internal Medicine

## 2014-12-24 VITALS — BP 124/80 | HR 66 | Temp 98.2°F | Resp 16 | Ht 66.25 in | Wt 139.0 lb

## 2014-12-24 DIAGNOSIS — I10 Essential (primary) hypertension: Secondary | ICD-10-CM | POA: Diagnosis not present

## 2014-12-24 DIAGNOSIS — Z79899 Other long term (current) drug therapy: Secondary | ICD-10-CM

## 2014-12-24 DIAGNOSIS — E559 Vitamin D deficiency, unspecified: Secondary | ICD-10-CM

## 2014-12-24 DIAGNOSIS — Z0001 Encounter for general adult medical examination with abnormal findings: Secondary | ICD-10-CM

## 2014-12-24 DIAGNOSIS — Z0000 Encounter for general adult medical examination without abnormal findings: Secondary | ICD-10-CM

## 2014-12-24 DIAGNOSIS — Z23 Encounter for immunization: Secondary | ICD-10-CM

## 2014-12-24 DIAGNOSIS — Z130 Encounter for screening for diseases of the blood and blood-forming organs and certain disorders involving the immune mechanism: Secondary | ICD-10-CM

## 2014-12-24 DIAGNOSIS — E782 Mixed hyperlipidemia: Secondary | ICD-10-CM

## 2014-12-24 DIAGNOSIS — Z1212 Encounter for screening for malignant neoplasm of rectum: Secondary | ICD-10-CM

## 2014-12-24 DIAGNOSIS — R7303 Prediabetes: Secondary | ICD-10-CM

## 2014-12-24 LAB — IRON AND TIBC
%SAT: 24 % (ref 11–50)
Iron: 103 ug/dL (ref 45–160)
TIBC: 422 ug/dL (ref 250–450)
UIBC: 319 ug/dL (ref 125–400)

## 2014-12-24 LAB — CBC WITH DIFFERENTIAL/PLATELET
Basophils Absolute: 0.1 10*3/uL (ref 0.0–0.1)
Basophils Relative: 1 % (ref 0–1)
Eosinophils Absolute: 0.1 10*3/uL (ref 0.0–0.7)
Eosinophils Relative: 2 % (ref 0–5)
HCT: 43.4 % (ref 36.0–46.0)
Hemoglobin: 14.7 g/dL (ref 12.0–15.0)
Lymphocytes Relative: 26 % (ref 12–46)
Lymphs Abs: 1.4 10*3/uL (ref 0.7–4.0)
MCH: 30.6 pg (ref 26.0–34.0)
MCHC: 33.9 g/dL (ref 30.0–36.0)
MCV: 90.4 fL (ref 78.0–100.0)
MPV: 9.2 fL (ref 8.6–12.4)
Monocytes Absolute: 0.4 10*3/uL (ref 0.1–1.0)
Monocytes Relative: 8 % (ref 3–12)
Neutro Abs: 3.5 10*3/uL (ref 1.7–7.7)
Neutrophils Relative %: 63 % (ref 43–77)
Platelets: 288 10*3/uL (ref 150–400)
RBC: 4.8 MIL/uL (ref 3.87–5.11)
RDW: 12.8 % (ref 11.5–15.5)
WBC: 5.5 10*3/uL (ref 4.0–10.5)

## 2014-12-24 LAB — LIPID PANEL
Cholesterol: 213 mg/dL — ABNORMAL HIGH (ref 125–200)
HDL: 62 mg/dL (ref >=46)
LDL Cholesterol: 127 mg/dL (ref <130)
Total CHOL/HDL Ratio: 3.4 Ratio (ref <=5.0)
Triglycerides: 118 mg/dL (ref <150)
VLDL: 24 mg/dL (ref <30)

## 2014-12-24 LAB — BASIC METABOLIC PANEL WITH GFR
BUN: 20 mg/dL (ref 7–25)
CO2: 29 mmol/L (ref 20–31)
Calcium: 9.6 mg/dL (ref 8.6–10.4)
Chloride: 100 mmol/L (ref 98–110)
Creat: 0.7 mg/dL (ref 0.50–0.99)
GFR, Est African American: >89 mL/min (ref >=60)
GFR, Est Non African American: >89 mL/min (ref >=60)
Glucose, Bld: 93 mg/dL (ref 65–99)
Potassium: 4.3 mmol/L (ref 3.5–5.3)
Sodium: 138 mmol/L (ref 135–146)

## 2014-12-24 LAB — HEPATIC FUNCTION PANEL
ALT: 22 U/L (ref 6–29)
AST: 21 U/L (ref 10–35)
Albumin: 5 g/dL (ref 3.6–5.1)
Alkaline Phosphatase: 88 U/L (ref 33–130)
Bilirubin, Direct: 0.1 mg/dL (ref <=0.2)
Indirect Bilirubin: 0.4 mg/dL (ref 0.2–1.2)
Total Bilirubin: 0.5 mg/dL (ref 0.2–1.2)
Total Protein: 6.9 g/dL (ref 6.1–8.1)

## 2014-12-24 LAB — MAGNESIUM: Magnesium: 2 mg/dL (ref 1.5–2.5)

## 2014-12-24 MED ORDER — RIZATRIPTAN BENZOATE 10 MG PO TABS: 10.0000 mg | ORAL_TABLET | Freq: Once | ORAL | Status: DC | PRN

## 2014-12-24 MED ORDER — VALACYCLOVIR HCL 1 G PO TABS: 1000.0000 mg | ORAL_TABLET | Freq: Every day | ORAL | Status: DC

## 2014-12-24 NOTE — Patient Instructions (Signed)
Preventive Care for Adults  A healthy lifestyle and preventive care can promote health and wellness. Preventive health guidelines for women include the following key practices.  A routine yearly physical is a good way to check with your health care provider about your health and preventive screening. It is a chance to share any concerns and updates on your health and to receive a thorough exam.  Visit your dentist for a routine exam and preventive care every 6 months. Brush your teeth twice a day and floss once a day. Good oral hygiene prevents tooth decay and gum disease.  The frequency of eye exams is based on your age, health, family medical history, use of contact lenses, and other factors. Follow your health care provider's recommendations for frequency of eye exams.  Eat a healthy diet. Foods like vegetables, fruits, whole grains, low-fat dairy products, and lean protein foods contain the nutrients you need without too many calories. Decrease your intake of foods high in solid fats, added sugars, and salt. Eat the right amount of calories for you.Get information about a proper diet from your health care provider, if necessary.  Regular physical exercise is one of the most important things you can do for your health. Most adults should get at least 150 minutes of moderate-intensity exercise (any activity that increases your heart rate and causes you to sweat) each week. In addition, most adults need muscle-strengthening exercises on 2 or more days a week.  Maintain a healthy weight. The body mass index (BMI) is a screening tool to identify possible weight problems. It provides an estimate of body fat based on height and weight. Your health care provider can find your BMI and can help you achieve or maintain a healthy weight.For adults 20 years and older:  A BMI below 18.5 is considered underweight.  A BMI of 18.5 to 24.9 is normal.  A BMI of 25 to 29.9 is considered overweight.  A BMI  of 30 and above is considered obese.  Maintain normal blood lipids and cholesterol levels by exercising and minimizing your intake of saturated fat. Eat a balanced diet with plenty of fruit and vegetables. Blood tests for lipids and cholesterol should begin at age 59 and be repeated every 5 years. If your lipid or cholesterol levels are high, you are over 50, or you are at high risk for heart disease, you may need your cholesterol levels checked more frequently.Ongoing high lipid and cholesterol levels should be treated with medicines if diet and exercise are not working.  If you smoke, find out from your health care provider how to quit. If you do not use tobacco, do not start.  Lung cancer screening is recommended for adults aged 26-80 years who are at high risk for developing lung cancer because of a history of smoking. A yearly low-dose CT scan of the lungs is recommended for people who have at least a 30-pack-year history of smoking and are a current smoker or have quit within the past 15 years. A pack year of smoking is smoking an average of 1 pack of cigarettes a day for 1 year (for example: 1 pack a day for 30 years or 2 packs a day for 15 years). Yearly screening should continue until the smoker has stopped smoking for at least 15 years. Yearly screening should be stopped for people who develop a health problem that would prevent them from having lung cancer treatment.  High blood pressure causes heart disease and increases the risk of  stroke. Your blood pressure should be checked at least every 1 to 2 years. Ongoing high blood pressure should be treated with medicines if weight loss and exercise do not work.  If you are 27-68 years old, ask your health care provider if you should take aspirin to prevent strokes.  Diabetes screening involves taking a blood sample to check your fasting blood sugar level. This should be done once every 3 years, after age 60, if you are within normal weight and  without risk factors for diabetes. Testing should be considered at a younger age or be carried out more frequently if you are overweight and have at least 1 risk factor for diabetes.  Breast cancer screening is essential preventive care for women. You should practice "breast self-awareness." This means understanding the normal appearance and feel of your breasts and may include breast self-examination. Any changes detected, no matter how small, should be reported to a health care provider. Women in their 28s and 30s should have a clinical breast exam (CBE) by a health care provider as part of a regular health exam every 1 to 3 years. After age 73, women should have a CBE every year. Starting at age 64, women should consider having a mammogram (breast X-ray test) every year. Women who have a family history of breast cancer should talk to their health care provider about genetic screening. Women at a high risk of breast cancer should talk to their health care providers about having an MRI and a mammogram every year.  Breast cancer gene (BRCA)-related cancer risk assessment is recommended for women who have family members with BRCA-related cancers. BRCA-related cancers include breast, ovarian, tubal, and peritoneal cancers. Having family members with these cancers may be associated with an increased risk for harmful changes (mutations) in the breast cancer genes BRCA1 and BRCA2. Results of the assessment will determine the need for genetic counseling and BRCA1 and BRCA2 testing.  Routine pelvic exams to screen for cancer are no longer recommended for nonpregnant women who are considered low risk for cancer of the pelvic organs (ovaries, uterus, and vagina) and who do not have symptoms. Ask your health care provider if a screening pelvic exam is right for you.  If you have had past treatment for cervical cancer or a condition that could lead to cancer, you need Pap tests and screening for cancer for at least 20  years after your treatment. If Pap tests have been discontinued, your risk factors (such as having a new sexual partner) need to be reassessed to determine if screening should be resumed. Some women have medical problems that increase the chance of getting cervical cancer. In these cases, your health care provider may recommend more frequent screening and Pap tests.  Colorectal cancer can be detected and often prevented. Most routine colorectal cancer screening begins at the age of 34 years and continues through age 54 years. However, your health care provider may recommend screening at an earlier age if you have risk factors for colon cancer. On a yearly basis, your health care provider may provide home test kits to check for hidden blood in the stool. Use of a small camera at the end of a tube, to directly examine the colon (sigmoidoscopy or colonoscopy), can detect the earliest forms of colorectal cancer. Talk to your health care provider about this at age 98, when routine screening begins. Direct exam of the colon should be repeated every 5-10 years through age 62 years, unless early forms of pre-cancerous  polyps or small growths are found.  Hepatitis C blood testing is recommended for all people born from [birth year] through 1964 and any individual with known risks for hepatitis C.  Pra  Osteoporosis is a disease in which the bones lose minerals and strength with aging. This can result in serious bone fractures or breaks. The risk of osteoporosis can be identified using a bone density scan. Women ages 55 years and over and women at risk for fractures or osteoporosis should discuss screening with their health care providers. Ask your health care provider whether you should take a calcium supplement or vitamin D to reduce the rate of osteoporosis.  Menopause can be associated with physical symptoms and risks. Hormone replacement therapy is available to decrease symptoms and risks. You should talk to your  health care provider about whether hormone replacement therapy is right for you.  Use sunscreen. Apply sunscreen liberally and repeatedly throughout the day. You should seek shade when your shadow is shorter than you. Protect yourself by wearing long sleeves, pants, a wide-brimmed hat, and sunglasses year round, whenever you are outdoors.  Once a month, do a whole body skin exam, using a mirror to look at the skin on your back. Tell your health care provider of new moles, moles that have irregular borders, moles that are larger than a pencil eraser, or moles that have changed in shape or color.  Stay current with required vaccines (immunizations).  Influenza vaccine. All adults should be immunized every year.  Tetanus, diphtheria, and acellular pertussis (Td, Tdap) vaccine. Pregnant women should receive 1 dose of Tdap vaccine during each pregnancy. The dose should be obtained regardless of the length of time since the last dose. Immunization is preferred during the 27th-36th week of gestation. An adult who has not previously received Tdap or who does not know her vaccine status should receive 1 dose of Tdap. This initial dose should be followed by tetanus and diphtheria toxoids (Td) booster doses every 10 years. Adults with an unknown or incomplete history of completing a 3-dose immunization series with Td-containing vaccines should begin or complete a primary immunization series including a Tdap dose. Adults should receive a Td booster every 10 years.  Varicella vaccine. An adult without evidence of immunity to varicella should receive 2 doses or a second dose if she has previously received 1 dose. Pregnant females who do not have evidence of immunity should receive the first dose after pregnancy. This first dose should be obtained before leaving the health care facility. The second dose should be obtained 4-8 weeks after the first dose.  Human papillomavirus (HPV) vaccine. Females aged 13-26 years  who have not received the vaccine previously should obtain the 3-dose series. The vaccine is not recommended for use in pregnant females. However, pregnancy testing is not needed before receiving a dose. If a female is found to be pregnant after receiving a dose, no treatment is needed. In that case, the remaining doses should be delayed until after the pregnancy. Immunization is recommended for any person with an immunocompromised condition through the age of 13 years if she did not get any or all doses earlier. During the 3-dose series, the second dose should be obtained 4-8 weeks after the first dose. The third dose should be obtained 24 weeks after the first dose and 16 weeks after the second dose.  Zoster vaccine. One dose is recommended for adults aged 21 years or older unless certain conditions are present.  Measles, mumps, and rubella (  MMR) vaccine. Adults born before [birth year] generally are considered immune to measles and mumps. Adults born in [birth year] or later should have 1 or more doses of MMR vaccine unless there is a contraindication to the vaccine or there is laboratory evidence of immunity to each of the three diseases. A routine second dose of MMR vaccine should be obtained at least 28 days after the first dose for students attending postsecondary schools, health care workers, or international travelers. People who received inactivated measles vaccine or an unknown type of measles vaccine during 1963-1967 should receive 2 doses of MMR vaccine. People who received inactivated mumps vaccine or an unknown type of mumps vaccine before 1979 and are at high risk for mumps infection should consider immunization with 2 doses of MMR vaccine. For females of childbearing age, rubella immunity should be determined. If there is no evidence of immunity, females who are not pregnant should be vaccinated. If there is no evidence of immunity, females who are pregnant should delay immunization until after pregnancy.  Unvaccinated health care workers born before [birth year] who lack laboratory evidence of measles, mumps, or rubella immunity or laboratory confirmation of disease should consider measles and mumps immunization with 2 doses of MMR vaccine or rubella immunization with 1 dose of MMR vaccine.  Pneumococcal 13-valent conjugate (PCV13) vaccine. When indicated, a person who is uncertain of her immunization history and has no record of immunization should receive the PCV13 vaccine. An adult aged 39 years or older who has certain medical conditions and has not been previously immunized should receive 1 dose of PCV13 vaccine. This PCV13 should be followed with a dose of pneumococcal polysaccharide (PPSV23) vaccine. The PPSV23 vaccine dose should be obtained at least 8 weeks after the dose of PCV13 vaccine. An adult aged 62 years or older who has certain medical conditions and previously received 1 or more doses of PPSV23 vaccine should receive 1 dose of PCV13. The PCV13 vaccine dose should be obtained 1 or more years after the last PPSV23 vaccine dose.    Pneumococcal polysaccharide (PPSV23) vaccine. When PCV13 is also indicated, PCV13 should be obtained first. All adults aged 67 years and older should be immunized. An adult younger than age 45 years who has certain medical conditions should be immunized. Any person who resides in a nursing home or long-term care facility should be immunized. An adult smoker should be immunized. People with an immunocompromised condition and certain other conditions should receive both PCV13 and PPSV23 vaccines. People with human immunodeficiency virus (HIV) infection should be immunized as soon as possible after diagnosis. Immunization during chemotherapy or radiation therapy should be avoided. Routine use of PPSV23 vaccine is not recommended for American Indians, [place] Natives, or people younger than 65 years unless there are medical conditions that require PPSV23 vaccine. When indicated,  people who have unknown immunization and have no record of immunization should receive PPSV23 vaccine. One-time revaccination 5 years after the first dose of PPSV23 is recommended for people aged 19-64 years who have chronic kidney failure, nephrotic syndrome, asplenia, or immunocompromised conditions. People who received 1-2 doses of PPSV23 before age 23 years should receive another dose of PPSV23 vaccine at age 35 years or later if at least 5 years have passed since the previous dose. Doses of PPSV23 are not needed for people immunized with PPSV23 at or after age 38 years.  Preventive Services / Frequency   Ages 43 to 86 years  Blood pressure check.  Lipid and cholesterol check.  Lung  cancer screening. / Every year if you are aged 26-80 years and have a 30-pack-year history of smoking and currently smoke or have quit within the past 15 years. Yearly screening is stopped once you have quit smoking for at least 15 years or develop a health problem that would prevent you from having lung cancer treatment.  Clinical breast exam.** / Every year after age 2 years.  BRCA-related cancer risk assessment.** / For women who have family members with a BRCA-related cancer (breast, ovarian, tubal, or peritoneal cancers).  Mammogram.** / Every year beginning at age 52 years and continuing for as long as you are in good health. Consult with your health care provider.  Pap test.** / Every 3 years starting at age 59 years through age 71 or 38 years with a history of 3 consecutive normal Pap tests.  HPV screening.** / Every 3 years from ages 42 years through ages 51 to 14 years with a history of 3 consecutive normal Pap tests.  Fecal occult blood test (FOBT) of stool. / Every year beginning at age 74 years and continuing until age 75 years. You may not need to do this test if you get a colonoscopy every 10 years.  Flexible sigmoidoscopy or colonoscopy.** / Every 5 years for a flexible sigmoidoscopy or  every 10 years for a colonoscopy beginning at age 34 years and continuing until age 75 years.  Hepatitis C blood test.** / For all people born from [birth year] through 1964 and any individual with known risks for hepatitis C.  Skin self-exam. / Monthly.  Influenza vaccine. / Every year.  Tetanus, diphtheria, and acellular pertussis (Tdap/Td) vaccine.** / Consult your health care provider. Pregnant women should receive 1 dose of Tdap vaccine during each pregnancy. 1 dose of Td every 10 years.  Varicella vaccine.** / Consult your health care provider. Pregnant females who do not have evidence of immunity should receive the first dose after pregnancy.  Zoster vaccine.** / 1 dose for adults aged 72 years or older.  Pneumococcal 13-valent conjugate (PCV13) vaccine.** / Consult your health care provider.  Pneumococcal polysaccharide (PPSV23) vaccine.** / 1 to 2 doses if you smoke cigarettes or if you have certain conditions.  Meningococcal vaccine.** / Consult your health care provider.  Hepatitis A vaccine.** / Consult your health care provider.  Hepatitis B vaccine.** / Consult your health care provider. Screening for abdominal aortic aneurysm (AAA)  by ultrasound is recommended for people over 50 who have history of high blood pressure or who are current or former smokers.

## 2014-12-24 NOTE — Progress Notes (Signed)
Patient ID: Christine [name], female   DOB: 1950/11/18, 64 y.o.   MRN: [id number]  Complete Physical  Assessment and Plan:   1. Essential hypertension  - Urinalysis, Routine w reflex microscopic (not at [facility]) - Microalbumin / creatinine urine ratio - EKG 12-Lead - [place], RETROPERITNL ABD,  LTD - TSH  2. Mixed hyperlipidemia  - Lipid panel  3. Prediabetes  - Hemoglobin A1c - Insulin, random  4. Vitamin D deficiency  - Vit D  25 hydroxy (rtn osteoporosis monitoring)  5. Medication management  - CBC with Differential/Platelet - BASIC METABOLIC PANEL WITH GFR - Hepatic function panel - Magnesium  6. Screening for deficiency anemia  - Iron and TIBC - Vitamin B12  7. Screening for rectal cancer  - POC Hemoccult Bld/Stl (3-Cd Home Screen); Future  8. Encounter for general adult medical examination with abnormal findings  - CBC with Differential/Platelet - BASIC METABOLIC PANEL WITH GFR - Hepatic function panel - Magnesium - Lipid panel - Hemoglobin A1c - Insulin, random - Iron and TIBC - Vitamin B12 - Urinalysis, Routine w reflex microscopic (not at [facility]) - Microalbumin / creatinine urine ratio - EKG 12-Lead - [place], RETROPERITNL ABD,  LTD - POC Hemoccult Bld/Stl (3-Cd Home Screen); Future - Vit D  25 hydroxy (rtn osteoporosis monitoring) - TSH  9. Need for prophylactic vaccination and inoculation against influenza  - Flu vaccine > 3yo with preservative IM (Fluvirin Influenza Split)    Discussed med's effects and SE's. Screening labs and tests as requested with regular follow-up as recommended.  HPI  64 y.o. female  presents for a complete physical.  Her blood pressure has been controlled at home, today their BP is BP: 124/80 mmHg.  She does workout. She denies chest pain, shortness of breath, dizziness. She does report that she has had a lot more headaches over the past 1-2 months.  She reports that she thinks that it may be due to tightness in her neck.   She reports that it feels like her head is in a vice grip, eyes are sore, and occiput is sore.  She reports that she thinks that this is due to the muscles spasms from her neck.    She is on cholesterol medication and denies myalgias. Her cholesterol is at goal. The cholesterol last visit was:  Lab Results  Component Value Date   CHOL 208* 05/01/2014   HDL 73 05/01/2014   LDLCALC 116* 05/01/2014   TRIG 97 05/01/2014   CHOLHDL 2.8 05/01/2014  .  She has been working on diet and exercise for prediabetes, she is on bASA, she is not on ACE/ARB and denies foot ulcerations, hyperglycemia, hypoglycemia , increased appetite, nausea, paresthesia of the feet, polydipsia, polyuria, visual disturbances, vomiting and weight loss. Last A1C in the office was:  Lab Results  Component Value Date   HGBA1C 5.6 05/01/2014    Patient is on Vitamin D supplement.   Lab Results  Component Value Date   VD25OH 43 05/01/2014       Current Medications:  Current Outpatient Prescriptions on File Prior to Visit  Medication Sig Dispense Refill  . amLODipine (NORVASC) 2.5 MG tablet Take 1 tablet (2.5 mg total) by mouth daily. 90 tablet 1  . B Complex Vitamins (VITAMIN B COMPLEX PO) Take by mouth.    . Cetirizine HCl (ZYRTEC ALLERGY PO) Take by mouth.    . Cholecalciferol (VITAMIN D PO) Take 5,000 Units by mouth daily.    . diazepam (VALIUM) 5  MG tablet Take 1 tablet (5 mg total) by mouth every 8 (eight) hours as needed for anxiety. 30 tablet 0  . diphenhydrAMINE (BENADRYL) 25 MG tablet Take 25 mg by mouth every 6 (six) hours as needed.    Marland Kitchen estradiol (ESTRACE) 0.1 MG/GM vaginal cream Apply externally as directed 42.5 g 0  . Estradiol (VAGIFEM) 10 MCG TABS vaginal tablet Place 1 tablet (10 mcg total) vaginally 2 (two) times a week. 24 tablet 3  . GLUCOSAMINE-CHONDROITIN PO Take 1,500 Units by mouth daily.     Marland Kitchen MAGNESIUM PO Take 800 Units by mouth daily.     Marland Kitchen MELATONIN PO Take by mouth daily.    . meloxicam  (MOBIC) 15 MG tablet Take 1 tablet (15 mg total) by mouth daily. 30 tablet 2  . Multiple Vitamins-Minerals (HAIR/SKIN/NAILS PO) Take 3 tablets by mouth daily.    . Omega-3 Fatty Acids (FISH OIL PO) Take 1,200 mg by mouth.     . rosuvastatin (CRESTOR) 40 MG tablet Take 1 tablet by mouth  every day for cholesterol 90 tablet 1  . spironolactone (ALDACTONE) 100 MG tablet Take 2 tablets (200 mg total) by mouth daily. 180 tablet 4  . traMADol (ULTRAM) 50 MG tablet Take 1 tablet (50 mg total) by mouth every 6 (six) hours as needed. 40 tablet 0  . valACYclovir (VALTREX) 1000 MG tablet TAKE 1 TABLET BY MOUTH EVERY DAY (Patient taking differently: TAKE 1 TABLET BY MOUTH EVERY DAY PRN) 90 tablet 1   No current facility-administered medications on file prior to visit.    Health Maintenance:   Immunization History  Administered Date(s) Administered  . Influenza Split 12/23/2013  . Influenza Whole 12/16/2012  . Pneumococcal Polysaccharide-23 08/24/2004  . Tdap 11/28/2010  . Zoster 01/19/2012    Tetanus: 11/28/10 Flu vaccine: Today Zostavax: 01/19/12 Pap: 01/21/14 MGM: Scheduled for December Colonoscopy: Scheduled December Last Eye Exam: Dr. Herbert Deaner, due to go back this year  Patient Care Team: Unk Pinto, MD as PCP - General (Internal Medicine) Megan Salon, MD as Consulting Physician (Gynecology) Carlena Bjornstad, MD as Consulting Physician (Cardiology) Inda Castle, MD as Consulting Physician (Gastroenterology) Monna Fam, MD as Consulting Physician (Ophthalmology) Gerarda Fraction, MD as Referring Physician (Ophthalmology) Glenna Fellows, MD as Attending Physician (Neurosurgery) Devra Dopp, MD as Referring Physician (Dermatology) Marybelle Killings, MD as Consulting Physician (Orthopedic Surgery)  Allergies:  Allergies  Allergen Reactions  . Bactrim [Sulfamethoxazole-Trimethoprim] Itching  . Lipitor [Atorvastatin] Other (See Comments)    headache  . Monistat [Miconazole]  Itching    Medical History:  Past Medical History  Diagnosis Date  . Degenerative arthritis   . PCOS (polycystic ovarian syndrome)   . AC (acromioclavicular) joint bone spurs   . Hypertension   . Goiter     multi nodular   . History of PCOS   . Hyperlipidemia   . Anemia   . Anxiety   . Depression   . GERD (gastroesophageal reflux disease)   . Nevus of left eye     Surgical History:  Past Surgical History  Procedure Laterality Date  . Tubal ligation    . Tonsillectomy and adenoidectomy    . Bunionectomy Bilateral     Feet  . Toe surgery Left     Bone Spur  . Hernia repair      Family History:  Family History  Problem Relation Age of Onset  . Thyroid disease Mother     hyperthyroidism  . Hypertension Mother   .  Cancer Mother   . Hyperlipidemia Mother   . Cancer Father   . Heart disease Brother   . Hyperlipidemia Brother   . Stroke Paternal Grandmother   . Cancer Brother 42    sarcoma    Social History:  Social History  Substance Use Topics  . Smoking status: Never Smoker   . Smokeless tobacco: Never Used  . Alcohol Use: 0.6 oz/week    1 Glasses of wine per week     Comment: occasionally wine    Review of Systems: Review of Systems  Constitutional: Positive for malaise/fatigue. Negative for fever and chills.  HENT: Negative for congestion, ear pain and sore throat.   Eyes: Negative.   Respiratory: Negative for cough, shortness of breath and wheezing.   Cardiovascular: Positive for palpitations. Negative for chest pain and leg swelling.  Gastrointestinal: Negative for heartburn, diarrhea, constipation, blood in stool and melena.  Genitourinary: Negative.   Skin: Negative.   Neurological: Positive for headaches. Negative for dizziness, sensory change and loss of consciousness.  Psychiatric/Behavioral: Negative for depression. The patient has insomnia. The patient is not nervous/anxious.     Physical Exam: Estimated body mass index is 22.26 kg/(m^2)  as calculated from the following:   Height as of this encounter: 5' 6.25" (1.683 m).   Weight as of this encounter: 139 lb (63.05 kg). BP 124/80 mmHg  Pulse 66  Temp(Src) 98.2 F (36.8 C) (Temporal)  Resp 16  Ht 5' 6.25" (1.683 m)  Wt 139 lb (63.05 kg)  BMI 22.26 kg/m2  LMP 12/05/2010  General Appearance: Well nourished well developed, in no apparent distress.  Eyes: PERRLA, EOMs, conjunctiva no swelling or erythema ENT/Mouth: Ear canals normal without obstruction, swelling, erythema, or discharge.  TMs normal bilaterally with no erythema, bulging, retraction, or loss of landmark.  Oropharynx moist and clear with no exudate, erythema, or swelling.   Neck: Supple, thyroid normal. No bruits.  No cervical adenopathy Respiratory: Respiratory effort normal, Breath sounds clear A&P without wheeze, rhonchi, rales.   Cardio: RRR without murmurs, rubs or gallops. Brisk peripheral pulses without edema.  Chest: symmetric, with normal excursions Breasts: Symmetric, without lumps, nipple discharge, retractions.  Abdomen: Soft, nontender, no guarding, rebound, hernias, masses, or organomegaly.  Lymphatics: Non tender without lymphadenopathy.  Musculoskeletal: Full ROM all peripheral extremities,5/5 strength, and normal gait.  Skin: Warm, dry without rashes, lesions, ecchymosis. Neuro: Awake and oriented X 3, Cranial nerves intact, reflexes equal bilaterally. Normal muscle tone, no cerebellar symptoms. Sensation intact.  Psych:  normal affect, Insight and Judgment appropriate.   EKG: WNL no changes.  AORTA SCAN: WNL   Over 40 minutes of exam, counseling, chart review and critical decision making was performed  Starlyn Skeans 10:17 AM [facility]

## 2014-12-25 ENCOUNTER — Encounter: Payer: Self-pay | Admitting: Gastroenterology

## 2014-12-25 LAB — URINALYSIS, ROUTINE W REFLEX MICROSCOPIC
Bilirubin Urine: NEGATIVE
Glucose, UA: NEGATIVE
Hgb urine dipstick: NEGATIVE
Ketones, ur: NEGATIVE
Leukocytes, UA: NEGATIVE
Nitrite: NEGATIVE
Protein, ur: NEGATIVE
Specific Gravity, Urine: 1.022 (ref 1.001–1.035)
pH: 5.5 (ref 5.0–8.0)

## 2014-12-25 LAB — HEMOGLOBIN A1C
Hgb A1c MFr Bld: 5.6 % (ref <5.7)
Mean Plasma Glucose: 114 mg/dL (ref <117)

## 2014-12-25 LAB — MICROALBUMIN / CREATININE URINE RATIO
Creatinine, Urine: 142 mg/dL (ref 20–320)
Microalb Creat Ratio: 3 mcg/mg creat (ref <30)
Microalb, Ur: 0.4 mg/dL

## 2014-12-25 LAB — TSH: TSH: 1.923 u[IU]/mL (ref 0.350–4.500)

## 2014-12-25 LAB — VITAMIN D 25 HYDROXY (VIT D DEFICIENCY, FRACTURES): Vit D, 25-Hydroxy: 36 ng/mL (ref 30–100)

## 2014-12-25 LAB — INSULIN, RANDOM: Insulin: 3.3 u[IU]/mL (ref 2.0–19.6)

## 2014-12-25 LAB — VITAMIN B12: Vitamin B-12: 624 pg/mL (ref 211–911)

## 2014-12-28 ENCOUNTER — Ambulatory Visit (AMBULATORY_SURGERY_CENTER): Payer: Self-pay | Admitting: *Deleted

## 2014-12-28 VITALS — Ht 66.0 in | Wt 145.3 lb

## 2014-12-28 DIAGNOSIS — Z1211 Encounter for screening for malignant neoplasm of colon: Secondary | ICD-10-CM

## 2014-12-28 MED ORDER — NA SULFATE-K SULFATE-MG SULF 17.5-3.13-1.6 GM/177ML PO SOLN: ORAL | Status: DC

## 2014-12-28 NOTE — Progress Notes (Signed)
No allergies to eggs or soy. No problems with anesthesia.  Pt given Emmi instructions for colonoscopy  No oxygen use  No diet drug use  

## 2014-12-29 ENCOUNTER — Encounter: Payer: Self-pay | Admitting: Gastroenterology

## 2015-02-02 ENCOUNTER — Ambulatory Visit
Admission: RE | Admit: 2015-02-02 | Discharge: 2015-02-02 | Disposition: A | Discharge status: Home / Self Care | Payer: 59 | Source: Ambulatory Visit

## 2015-02-02 DIAGNOSIS — Z1231 Encounter for screening mammogram for malignant neoplasm of breast: Secondary | ICD-10-CM

## 2015-02-05 ENCOUNTER — Ambulatory Visit (AMBULATORY_SURGERY_CENTER): Payer: 59 | Admitting: Gastroenterology

## 2015-02-05 ENCOUNTER — Encounter: Payer: Self-pay | Admitting: Gastroenterology

## 2015-02-05 VITALS — BP 97/68 | HR 71 | Temp 98.4°F | Resp 24 | Ht 66.0 in | Wt 145.0 lb

## 2015-02-05 DIAGNOSIS — Z1211 Encounter for screening for malignant neoplasm of colon: Secondary | ICD-10-CM

## 2015-02-05 MED ORDER — SODIUM CHLORIDE 0.9 % IV SOLN: 500.0000 mL | INTRAVENOUS | Status: DC

## 2015-02-05 NOTE — Progress Notes (Signed)
A/ox3 pleased with MAC, report to Jane RN 

## 2015-02-05 NOTE — Op Note (Signed)
[facility]  [address] [place], [id number]   COLONOSCOPY PROCEDURE REPORT  PATIENT: Christine, [name]  MR#: [id number] BIRTHDATE: 06/30/1950 , 60  yrs. old GENDER: female ENDOSCOPIST: Harl Bowie, MD REFERRED [id number] Melford Aase, M.D. PROCEDURE DATE:  02/05/2015 PROCEDURE:   Colonoscopy, screening First Screening Colonoscopy - Avg.  risk and is 50 yrs.  old or older - No.  Prior Negative Screening - Now for repeat screening. 10 or more years since last screening  History of Adenoma - Now for follow-up colonoscopy & has been > or = to 3 yrs.  N/A  Polyps removed today? No Recommend repeat exam, <10 yrs? No ASA CLASS:   Class II INDICATIONS:Screening for colonic neoplasia and Colorectal Neoplasm Risk Assessment for this procedure is average risk. MEDICATIONS: Propofol 300 mg IV  DESCRIPTION OF PROCEDURE:   After the risks benefits and alternatives of the procedure were thoroughly explained, informed consent was obtained.  The digital rectal exam revealed no abnormalities of the rectum.   The LB [id number]  endoscope was introduced through the anus and advanced to the terminal ileum which was intubated for a short distance. No adverse events experienced.   The quality of the prep was good.  The instrument was then slowly withdrawn as the colon was fully examined. Estimated blood loss is zero unless otherwise noted in this procedure report.   COLON FINDINGS: A normal appearing cecum, ileocecal valve, and appendiceal orifice were identified.  The ascending, transverse, descending, sigmoid colon, and rectum appeared unremarkable.   The examined terminal ileum appeared to be normal.  Retroflexed views revealed no abnormalities. The time to cecum = 1.3 Withdrawal time = 11.5   The scope was withdrawn and the procedure completed. COMPLICATIONS: There were no immediate complications.  ENDOSCOPIC IMPRESSION: 1.   Normal colonoscopy 2.   The examined  terminal ileum appeared to be normal  RECOMMENDATIONS: You should continue to follow colorectal cancer screening guidelines for "routine risk" patients with a repeat colonoscopy in 10 years.   eSigned:  Harl Bowie, MD 02/05/2015 8:40 AM

## 2015-02-05 NOTE — Patient Instructions (Signed)
YOU HAD AN ENDOSCOPIC PROCEDURE TODAY AT [facility]:   Refer to the procedure report that was given to you for any specific questions about what was found during the examination.  If the procedure report does not answer your questions, please call your gastroenterologist to clarify.  If you requested that your care partner not be given the details of your procedure findings, then the procedure report has been included in a sealed envelope for you to review at your convenience later.  YOU SHOULD EXPECT: Some feelings of bloating in the abdomen. Passage of more gas than usual.  Walking can help get rid of the air that was put into your GI tract during the procedure and reduce the bloating. If you had a lower endoscopy (such as a colonoscopy or flexible sigmoidoscopy) you may notice spotting of blood in your stool or on the toilet paper. If you underwent a bowel prep for your procedure, you may not have a normal bowel movement for a few days.  Please Note:  You might notice some irritation and congestion in your nose or some drainage.  This is from the oxygen used during your procedure.  There is no need for concern and it should clear up in a day or so.  SYMPTOMS TO REPORT IMMEDIATELY:   Following lower endoscopy (colonoscopy or flexible sigmoidoscopy):  Excessive amounts of blood in the stool  Significant tenderness or worsening of abdominal pains  Swelling of the abdomen that is new, acute  Fever of 100F or higher   For urgent or emergent issues, a gastroenterologist can be reached at any hour by calling [phone].   DIET: Your first meal following the procedure should be a small meal and then it is ok to progress to your normal diet. Heavy or fried foods are harder to digest and may make you feel nauseous or bloated.  Likewise, meals heavy in dairy and vegetables can increase bloating.  Drink plenty of fluids but you should avoid alcoholic beverages for 24  hours.  ACTIVITY:  You should plan to take it easy for the rest of today and you should NOT DRIVE or use heavy machinery until tomorrow (because of the sedation medicines used during the test).    FOLLOW UP: Our staff will call the number listed on your records the next business day following your procedure to check on you and address any questions or concerns that you may have regarding the information given to you following your procedure. If we do not reach you, we will leave a message.  However, if you are feeling well and you are not experiencing any problems, there is no need to return our call.  We will assume that you have returned to your regular daily activities without incident.  If any biopsies were taken you will be contacted by phone or by letter within the next 1-3 weeks.  Please call us at [phone] if you have not heard about the biopsies in 3 weeks.    SIGNATURES/CONFIDENTIALITY: You and/or your care partner have signed paperwork which will be entered into your electronic medical record.  These signatures attest to the fact that that the information above on your After Visit Summary has been reviewed and is understood.  Full responsibility of the confidentiality of this discharge information lies with you and/or your care-partner.  Recall 10 years-2026.

## 2015-02-05 NOTE — Progress Notes (Signed)
Pt. Complains of irritation left eye.  Rubs often.  Will use eye gtts when she gets home.

## 2015-02-08 ENCOUNTER — Telehealth: Payer: Self-pay | Admitting: Emergency Medicine

## 2015-02-08 NOTE — Telephone Encounter (Signed)
Left message, identifier present 

## 2015-06-25 ENCOUNTER — Ambulatory Visit: Payer: Self-pay | Admitting: Internal Medicine

## 2015-07-06 ENCOUNTER — Ambulatory Visit (INDEPENDENT_AMBULATORY_CARE_PROVIDER_SITE_OTHER): Payer: 59 | Admitting: Internal Medicine

## 2015-07-06 ENCOUNTER — Encounter: Payer: Self-pay | Admitting: Internal Medicine

## 2015-07-06 VITALS — BP 104/66 | HR 68 | Temp 97.8°F | Resp 16 | Ht 66.25 in | Wt 143.0 lb

## 2015-07-06 DIAGNOSIS — I10 Essential (primary) hypertension: Secondary | ICD-10-CM | POA: Diagnosis not present

## 2015-07-06 DIAGNOSIS — R7303 Prediabetes: Secondary | ICD-10-CM | POA: Diagnosis not present

## 2015-07-06 DIAGNOSIS — E559 Vitamin D deficiency, unspecified: Secondary | ICD-10-CM

## 2015-07-06 DIAGNOSIS — E782 Mixed hyperlipidemia: Secondary | ICD-10-CM

## 2015-07-06 DIAGNOSIS — Z79899 Other long term (current) drug therapy: Secondary | ICD-10-CM

## 2015-07-06 LAB — BASIC METABOLIC PANEL WITH GFR
BUN: 16 mg/dL (ref 7–25)
CO2: 26 mmol/L (ref 20–31)
Calcium: 10.2 mg/dL (ref 8.6–10.4)
Chloride: 102 mmol/L (ref 98–110)
Creat: 0.86 mg/dL (ref 0.50–0.99)
GFR, Est African American: 83 mL/min (ref >=60)
GFR, Est Non African American: 72 mL/min (ref >=60)
Glucose, Bld: 98 mg/dL (ref 65–99)
Potassium: 5.6 mmol/L — ABNORMAL HIGH (ref 3.5–5.3)
Sodium: 138 mmol/L (ref 135–146)

## 2015-07-06 LAB — HEPATIC FUNCTION PANEL
ALT: 31 U/L — ABNORMAL HIGH (ref 6–29)
AST: 22 U/L (ref 10–35)
Albumin: 4.9 g/dL (ref 3.6–5.1)
Alkaline Phosphatase: 100 U/L (ref 33–130)
Bilirubin, Direct: 0.1 mg/dL (ref <=0.2)
Indirect Bilirubin: 0.5 mg/dL (ref 0.2–1.2)
Total Bilirubin: 0.6 mg/dL (ref 0.2–1.2)
Total Protein: 6.7 g/dL (ref 6.1–8.1)

## 2015-07-06 LAB — CBC WITH DIFFERENTIAL/PLATELET
Basophils Absolute: 52 cells/uL (ref 0–200)
Basophils Relative: 1 %
Eosinophils Absolute: 156 cells/uL (ref 15–500)
Eosinophils Relative: 3 %
HCT: 43.3 % (ref 35.0–45.0)
Hemoglobin: 14.6 g/dL (ref 11.7–15.5)
Lymphocytes Relative: 26 %
Lymphs Abs: 1352 cells/uL (ref 850–3900)
MCH: 30.6 pg (ref 27.0–33.0)
MCHC: 33.7 g/dL (ref 32.0–36.0)
MCV: 90.8 fL (ref 80.0–100.0)
MPV: 9.1 fL (ref 7.5–12.5)
Monocytes Absolute: 416 cells/uL (ref 200–950)
Monocytes Relative: 8 %
Neutro Abs: 3224 cells/uL (ref 1500–7800)
Neutrophils Relative %: 62 %
Platelets: 262 10*3/uL (ref 140–400)
RBC: 4.77 MIL/uL (ref 3.80–5.10)
RDW: 13.3 % (ref 11.0–15.0)
WBC: 5.2 10*3/uL (ref 3.8–10.8)

## 2015-07-06 LAB — HEMOGLOBIN A1C
Hgb A1c MFr Bld: 5.5 % (ref <5.7)
Mean Plasma Glucose: 111 mg/dL

## 2015-07-06 LAB — LIPID PANEL
Cholesterol: 194 mg/dL (ref 125–200)
HDL: 59 mg/dL (ref >=46)
LDL Cholesterol: 101 mg/dL (ref <130)
Total CHOL/HDL Ratio: 3.3 Ratio (ref <=5.0)
Triglycerides: 170 mg/dL — ABNORMAL HIGH (ref <150)
VLDL: 34 mg/dL — ABNORMAL HIGH (ref <30)

## 2015-07-06 LAB — TSH: TSH: 2.67 mIU/L

## 2015-07-06 NOTE — Progress Notes (Signed)
Assessment and Plan:  Hypertension:  -Continue medication,  -monitor blood pressure at home.  -Continue DASH diet.   -Reminder to go to the ER if any CP, SOB, nausea, dizziness, severe HA, changes vision/speech, left arm numbness and tingling, and jaw pain.  Cholesterol: -Continue diet and exercise.  -Check cholesterol.   Pre-diabetes: -discussed sustainable diet and not overrestricting -A1C have been traditionally good -recommended increasing exercise -Continue diet and exercise.  -Check A1C  Vitamin D Def: -check level -continue medications.   Continue diet and meds as discussed. Further disposition pending results of labs.  HPI 65 y.o. female  presents for 3 month follow up with hypertension, hyperlipidemia, prediabetes and vitamin D.   Her blood pressure has been controlled at home, today their BP is BP: 104/66 mmHg.   She does workout. She denies chest pain, shortness of breath, dizziness.   She is on cholesterol medication and denies myalgias. Her cholesterol is at goal. The cholesterol last visit was:   Lab Results  Component Value Date   CHOL 213* 12/24/2014   HDL 62 12/24/2014   LDLCALC 127 12/24/2014   TRIG 118 12/24/2014   CHOLHDL 3.4 12/24/2014     She has been working on diet and exercise for prediabetes, and denies foot ulcerations, hyperglycemia, hypoglycemia , increased appetite, nausea, paresthesia of the feet, polydipsia, polyuria, visual disturbances, vomiting and weight loss. Last A1C in the office was:  Lab Results  Component Value Date   HGBA1C 5.6 12/24/2014    Patient is on Vitamin D supplement.  Lab Results  Component Value Date   VD25OH 36 12/24/2014      She reports that she is not swimming as much as she was.  She reports that this has made it a lot more difficult to control her weight.    Current Medications:  Current Outpatient Prescriptions on File Prior to Visit  Medication Sig Dispense Refill  . B Complex Vitamins (VITAMIN B  COMPLEX PO) Take by mouth.    . Cetirizine HCl (ZYRTEC ALLERGY PO) Take by mouth.    . Cholecalciferol (VITAMIN D PO) Take 5,000 Units by mouth daily.    . Coenzyme Q10-Vitamin E (COQ10-VITAMIN E PO) Take by mouth daily.    . diphenhydrAMINE (BENADRYL) 25 MG tablet Take 25 mg by mouth every 6 (six) hours as needed.    Marland Kitchen estradiol (ESTRACE) 0.1 MG/GM vaginal cream Apply externally as directed 42.5 g 0  . Estradiol (VAGIFEM) 10 MCG TABS vaginal tablet Place 1 tablet (10 mcg total) vaginally 2 (two) times a week. 24 tablet 3  . GLUCOSAMINE-CHONDROITIN PO Take 1,500 Units by mouth daily.     Marland Kitchen MAGNESIUM PO Take 800 Units by mouth daily.     Marland Kitchen MELATONIN PO Take by mouth daily. Reported on 07/06/2015    . Multiple Vitamins-Minerals (HAIR/SKIN/NAILS PO) Take 3 tablets by mouth daily.    . Omega-3 Fatty Acids (FISH OIL PO) Take 1,200 mg by mouth.     . rizatriptan (MAXALT) 10 MG tablet Take 1 tablet (10 mg total) by mouth once as needed for migraine. May repeat in 2 hours if needed 30 tablet 2  . rosuvastatin (CRESTOR) 40 MG tablet Take 1 tablet by mouth  every day for cholesterol 90 tablet 1  . spironolactone (ALDACTONE) 100 MG tablet Take 2 tablets (200 mg total) by mouth daily. 180 tablet 4  . valACYclovir (VALTREX) 1000 MG tablet Take 1 tablet (1,000 mg total) by mouth daily. 90 tablet 1  No current facility-administered medications on file prior to visit.    Medical History:  Past Medical History  Diagnosis Date  . Degenerative arthritis   . PCOS (polycystic ovarian syndrome)   . AC (acromioclavicular) joint bone spurs   . Hypertension   . Goiter     multi nodular   . History of PCOS   . Hyperlipidemia   . Anemia   . Anxiety   . Depression   . GERD (gastroesophageal reflux disease)   . Nevus of left eye     Allergies:  Allergies  Allergen Reactions  . Bactrim [Sulfamethoxazole-Trimethoprim] Itching  . Lipitor [Atorvastatin] Other (See Comments)    headache  . Monistat  [Miconazole] Itching    burning     Review of Systems:  Review of Systems  Constitutional: Negative for fever, chills and malaise/fatigue.  HENT: Negative for congestion, ear pain and sore throat.   Eyes: Negative.   Respiratory: Negative for cough, shortness of breath and wheezing.   Cardiovascular: Negative for chest pain, palpitations and leg swelling.  Gastrointestinal: Negative for heartburn, abdominal pain, diarrhea, constipation, blood in stool and melena.  Genitourinary: Negative.   Skin: Negative.   Neurological: Negative for dizziness, sensory change, loss of consciousness and headaches.  Psychiatric/Behavioral: Negative for depression. The patient is not nervous/anxious and does not have insomnia.     Family history- Review and unchanged  Social history- Review and unchanged  Physical Exam: BP 104/66 mmHg  Pulse 68  Temp(Src) 97.8 F (36.6 C) (Temporal)  Resp 16  Ht 5' 6.25" (1.683 m)  Wt 143 lb (64.864 kg)  BMI 22.90 kg/m2  LMP 12/05/2010 Wt Readings from Last 3 Encounters:  07/06/15 143 lb (64.864 kg)  02/05/15 145 lb (65.772 kg)  12/28/14 145 lb 4.8 oz (65.908 kg)    General Appearance: Well nourished well developed, in no apparent distress. Eyes: PERRLA, EOMs, conjunctiva no swelling or erythema ENT/Mouth: Ear canals normal without obstruction, swelling, erythma, discharge.  TMs normal bilaterally.  Oropharynx moist, clear, without exudate, or postoropharyngeal swelling. Neck: Supple, thyroid normal,no cervical adenopathy  Respiratory: Respiratory effort normal, Breath sounds clear A&P without rhonchi, wheeze, or rale.  No retractions, no accessory usage. Cardio: RRR with no MRGs. Brisk peripheral pulses without edema.  Abdomen: Soft, + BS,  Non tender, no guarding, rebound, hernias, masses. Musculoskeletal: Full ROM, 5/5 strength, Normal gait Skin: Warm, dry without rashes, lesions, ecchymosis.  Neuro: Awake and oriented X 3, Cranial nerves intact.  Normal muscle tone, no cerebellar symptoms. Psych: Normal affect, Insight and Judgment appropriate.    Starlyn Skeans, PA-C 9:38 AM [facility]

## 2015-08-13 ENCOUNTER — Other Ambulatory Visit: Payer: Self-pay | Admitting: Internal Medicine

## 2015-08-13 ENCOUNTER — Encounter: Payer: Self-pay | Admitting: Obstetrics & Gynecology

## 2015-08-13 ENCOUNTER — Ambulatory Visit (INDEPENDENT_AMBULATORY_CARE_PROVIDER_SITE_OTHER): Payer: 59 | Admitting: Obstetrics & Gynecology

## 2015-08-13 VITALS — BP 122/76 | HR 62 | Resp 14 | Ht 66.0 in | Wt 146.0 lb

## 2015-08-13 DIAGNOSIS — Z01419 Encounter for gynecological examination (general) (routine) without abnormal findings: Secondary | ICD-10-CM | POA: Diagnosis not present

## 2015-08-13 DIAGNOSIS — Z124 Encounter for screening for malignant neoplasm of cervix: Secondary | ICD-10-CM | POA: Diagnosis not present

## 2015-08-13 MED ORDER — SPIRONOLACTONE 100 MG PO TABS: 200.0000 mg | ORAL_TABLET | Freq: Every day | ORAL | Status: DC

## 2015-08-13 MED ORDER — ESTRADIOL 10 MCG VA TABS
1.0000 | ORAL_TABLET | VAGINAL | Status: DC
Start: 2015-08-13 — End: 2015-12-02

## 2015-08-13 NOTE — Progress Notes (Signed)
65 y.o. [id number] MarriedCaucasianF here for annual exam.  Doing well.  One of her daughters is coming with her family in July for two weeks.  Pt very excited about this.    Grand-nephew just finished therapy for meduloblastoma.  He is 65 yo.  His recent scan was negative.  Lab work was done in May.  Reviewed with pt.    No vaginal bleeding.  Patient's last menstrual period was 12/05/2010.          Sexually active: Yes.    The current method of family planning is post menopausal status.    Exercising: Yes.    walking, bike, weights Smoker:  no  Health Maintenance: Pap:  Pt states 2016  History of abnormal Pap:  yes MMG:  02/02/2015 BIRADS 1 negative  Colonoscopy:  02/05/2015 normal, repeat 10 years BMD:   01/22/2014 normal  TDaP:  11/28/2010  Pneumonia vaccine(s):  08/24/2004, will check with PCP  Zostavax:   01/2012   Hep C testing: gives platelets regularly Screening Labs: PCP, Hb today: PCP, Urine today: PCP   reports that she has never smoked. She has never used smokeless tobacco. She reports that she drinks about 0.6 oz of alcohol per week. She reports that she does not use illicit drugs.  Past Medical History  Diagnosis Date  . Degenerative arthritis   . PCOS (polycystic ovarian syndrome)   . AC (acromioclavicular) joint bone spurs   . Hypertension   . Goiter     multi nodular   . History of PCOS   . Hyperlipidemia   . Anemia   . Anxiety   . Depression   . GERD (gastroesophageal reflux disease)   . Nevus of left eye     Past Surgical History  Procedure Laterality Date  . Tubal ligation  1995  . Tonsillectomy and adenoidectomy  1959  . Bunionectomy Bilateral 2006    Feet  . Toe surgery Left 2006    Bone Spur  . Hernia repair Right 1959    Current Outpatient Prescriptions  Medication Sig Dispense Refill  . B Complex Vitamins (VITAMIN B COMPLEX PO) Take by mouth.    . Cetirizine HCl (ZYRTEC ALLERGY PO) Take by mouth.    . Cholecalciferol (VITAMIN D PO) Take  5,000 Units by mouth daily.    . Coenzyme Q10-Vitamin E (COQ10-VITAMIN E PO) Take by mouth daily.    . diphenhydrAMINE (BENADRYL) 25 MG tablet Take 25 mg by mouth every 6 (six) hours as needed.    Marland Kitchen estradiol (ESTRACE) 0.1 MG/GM vaginal cream Apply externally as directed 42.5 g 0  . Estradiol (VAGIFEM) 10 MCG TABS vaginal tablet Place 1 tablet (10 mcg total) vaginally 2 (two) times a week. 24 tablet 3  . GLUCOSAMINE-CHONDROITIN PO Take 1,500 Units by mouth daily.     Marland Kitchen MAGNESIUM PO Take 800 Units by mouth daily.     Marland Kitchen MELATONIN PO Take by mouth daily. Reported on 07/06/2015    . Multiple Vitamins-Minerals (HAIR/SKIN/NAILS PO) Take 3 tablets by mouth daily.    . Omega-3 Fatty Acids (FISH OIL PO) Take 1,200 mg by mouth.     . rizatriptan (MAXALT) 10 MG tablet Take 1 tablet (10 mg total) by mouth once as needed for migraine. May repeat in 2 hours if needed 30 tablet 2  . rosuvastatin (CRESTOR) 40 MG tablet Take 1 tablet by mouth  every day for cholesterol 90 tablet 1  . spironolactone (ALDACTONE) 100 MG tablet Take 2 tablets (200  mg total) by mouth daily. 180 tablet 4  . valACYclovir (VALTREX) 1000 MG tablet Take 1 tablet (1,000 mg total) by mouth daily. 90 tablet 1   No current facility-administered medications for this visit.    Family History  Problem Relation Age of Onset  . Thyroid disease Mother     hyperthyroidism  . Hypertension Mother   . Cancer Mother   . Hyperlipidemia Mother   . Cancer Father   . Heart disease Brother   . Hyperlipidemia Brother   . Stroke Paternal Grandmother   . Cancer Brother 90    sarcoma  . Colon cancer Neg Hx   . Esophageal cancer Neg Hx   . Rectal cancer Neg Hx   . Stomach cancer Neg Hx     ROS:  Pertinent items are noted in HPI.  Otherwise, a comprehensive ROS was negative.  Exam:   Filed Vitals:   08/13/15 1249  BP: 122/76  Pulse: 62  Resp: 14  Height: 5\' 6"  (1.676 m)  Weight: 146 lb (66.225 kg)   General appearance: alert, cooperative  and appears stated age Head: Normocephalic, without obvious abnormality, atraumatic Neck: no adenopathy, supple, symmetrical, trachea midline and thyroid normal to inspection and palpation Lungs: clear to auscultation bilaterally Breasts: normal appearance, no masses or tenderness Heart: regular rate and rhythm Abdomen: soft, non-tender; bowel sounds normal; no masses,  no organomegaly Extremities: extremities normal, atraumatic, no cyanosis or edema Skin: Skin color, texture, turgor normal. No rashes or lesions Lymph nodes: Cervical, supraclavicular, and axillary nodes normal. No abnormal inguinal nodes palpated Neurologic: Grossly normal   Pelvic: External genitalia:  no lesions              Urethra:  normal appearing urethra with no masses, tenderness or lesions              Bartholins and Skenes: normal                 Vagina: normal appearing vagina with normal color and discharge, no lesions              Cervix: absent              Pap taken: Yes.   Bimanual Exam:  Uterus:  normal size, contour, position, consistency, mobility, non-tender              Adnexa: normal adnexa and no mass, fullness, tenderness               Rectovaginal: Confirms               Anus:  normal sphincter tone, no lesions  Chaperone was present for exam.  A:  Well Woman with normal exam Multinodular goiter with last u/s 5/14 PCOS Hirsutism Vaginal atrophic changes Oral HSV Borderline HbA1C  P: Mammogram yearly.  Labs with PCP every six months. Last thyroid u/s was 5/14. Stable from prior year. pap smear neg 2015. Neg HR HVP 2013. No pap today. vagifem 80meq pv twice weekly. #24/4RF.  This was sent to mail order pharmacy. Spirinolactone 100mg  bid 180tabs/4RF Will call if she needs Valtrex rx Estrace vaginal cream externally prn. Pt will call when she needs this. return annually or prn

## 2015-08-17 LAB — IPS PAP TEST WITH HPV

## 2015-11-09 ENCOUNTER — Telehealth: Payer: Self-pay | Admitting: Obstetrics & Gynecology

## 2015-11-09 NOTE — Telephone Encounter (Signed)
Patient would like to speak with nurse about her vagifem prescription.

## 2015-11-09 NOTE — Telephone Encounter (Signed)
Patient returned call. Patient states she just received a letter in the mail from [employer] stating that they would no longer cover her vagifem prescription and she would have to begin paying out of pocket. Patient calling to see if there is a cheaper alternative for her. RN advised Dr. Sabra Heck is out of the office today, but this message would be sent to her covering provider, Dr. Talbert Nan. Patient agreeable. Patient aware Dr. Talbert Nan is seeing patient's so a response might not be immediate.   Routing to provider for review.   Cc Dr. Sabra Heck

## 2015-11-09 NOTE — Telephone Encounter (Signed)
She should check with her pharmacy, she may be able to get premarin vaginal cream or estrace cream cheaper. If she wants, we can call in one of those scripts for her.

## 2015-11-09 NOTE — Telephone Encounter (Signed)
Message left to return call to Triage Nurse at [phone].    

## 2015-11-10 NOTE — Telephone Encounter (Signed)
Call to patient. Message given to patient as seen below from Dr. Talbert Nan. Patient states she will check the coverage for those medications with her insurance and call back if she determines those options are cheaper. RN also advised we do have coupons in the office we can provide as well. Patient verbalized understanding and said she would be in touch after speaking to the insurance company.    Routing to provider for final review. Patient agreeable to disposition. Will close encounter.    Cc Dr. Sabra Heck

## 2015-12-02 ENCOUNTER — Telehealth: Payer: Self-pay | Admitting: Obstetrics & Gynecology

## 2015-12-02 MED ORDER — ESTRADIOL 10 MCG VA TABS: 10.0000 ug | ORAL_TABLET | VAGINAL | 3 refills | Status: DC

## 2015-12-02 NOTE — Telephone Encounter (Signed)
Spoke with patient. Printed paper rx for Vagifem has been written and is ready for pick up at her convenience. Patient is agreeable and will come to the office on 12/06/2015 for pick up. Placed rx at the front desk in sealed envelop with patients name and DOB on the front.  Will close encounter.

## 2015-12-02 NOTE — Telephone Encounter (Signed)
Dr.Miller, paper order for Vagifem 10 mcg place 1 tablet vaginally two times a week #24 3RF to your desk for review and signature for patient pick up.

## 2015-12-02 NOTE — Telephone Encounter (Signed)
Rx printed for pt.  OK to close encounter.

## 2015-12-02 NOTE — Telephone Encounter (Signed)
Patient called and said she'd like a paper prescription for Vagifem tablets. She said, "My pharmacy has increased the cost for me to over $100.00. I can get it from a [facility] for about the cost of a copay."

## 2015-12-24 ENCOUNTER — Encounter: Payer: Self-pay | Admitting: Internal Medicine

## 2015-12-24 ENCOUNTER — Ambulatory Visit (INDEPENDENT_AMBULATORY_CARE_PROVIDER_SITE_OTHER): Payer: 59 | Admitting: Internal Medicine

## 2015-12-24 VITALS — BP 108/64 | HR 58 | Temp 98.0°F | Resp 16 | Ht 66.0 in | Wt 146.0 lb

## 2015-12-24 DIAGNOSIS — Z23 Encounter for immunization: Secondary | ICD-10-CM | POA: Diagnosis not present

## 2015-12-24 DIAGNOSIS — Z0001 Encounter for general adult medical examination with abnormal findings: Secondary | ICD-10-CM

## 2015-12-24 DIAGNOSIS — R7303 Prediabetes: Secondary | ICD-10-CM

## 2015-12-24 DIAGNOSIS — N952 Postmenopausal atrophic vaginitis: Secondary | ICD-10-CM

## 2015-12-24 DIAGNOSIS — Z0000 Encounter for general adult medical examination without abnormal findings: Secondary | ICD-10-CM | POA: Diagnosis not present

## 2015-12-24 DIAGNOSIS — Z1389 Encounter for screening for other disorder: Secondary | ICD-10-CM

## 2015-12-24 DIAGNOSIS — E559 Vitamin D deficiency, unspecified: Secondary | ICD-10-CM

## 2015-12-24 DIAGNOSIS — Z136 Encounter for screening for cardiovascular disorders: Secondary | ICD-10-CM

## 2015-12-24 DIAGNOSIS — Z130 Encounter for screening for diseases of the blood and blood-forming organs and certain disorders involving the immune mechanism: Secondary | ICD-10-CM

## 2015-12-24 DIAGNOSIS — E782 Mixed hyperlipidemia: Secondary | ICD-10-CM

## 2015-12-24 DIAGNOSIS — Z79899 Other long term (current) drug therapy: Secondary | ICD-10-CM

## 2015-12-24 DIAGNOSIS — I10 Essential (primary) hypertension: Secondary | ICD-10-CM

## 2015-12-24 LAB — CBC WITH DIFFERENTIAL/PLATELET
Basophils Absolute: 47 cells/uL (ref 0–200)
Basophils Relative: 1 %
Eosinophils Absolute: 94 cells/uL (ref 15–500)
Eosinophils Relative: 2 %
HCT: 43 % (ref 35.0–45.0)
Hemoglobin: 14.4 g/dL (ref 11.7–15.5)
Lymphocytes Relative: 26 %
Lymphs Abs: 1222 cells/uL (ref 850–3900)
MCH: 30.4 pg (ref 27.0–33.0)
MCHC: 33.5 g/dL (ref 32.0–36.0)
MCV: 90.9 fL (ref 80.0–100.0)
MPV: 8.9 fL (ref 7.5–12.5)
Monocytes Absolute: 376 cells/uL (ref 200–950)
Monocytes Relative: 8 %
Neutro Abs: 2961 cells/uL (ref 1500–7800)
Neutrophils Relative %: 63 %
Platelets: 265 10*3/uL (ref 140–400)
RBC: 4.73 MIL/uL (ref 3.80–5.10)
RDW: 12.7 % (ref 11.0–15.0)
WBC: 4.7 10*3/uL (ref 3.8–10.8)

## 2015-12-24 LAB — VITAMIN B12: Vitamin B-12: 426 pg/mL (ref 200–1100)

## 2015-12-24 LAB — HEPATIC FUNCTION PANEL
ALT: 21 U/L (ref 6–29)
AST: 18 U/L (ref 10–35)
Albumin: 5 g/dL (ref 3.6–5.1)
Alkaline Phosphatase: 84 U/L (ref 33–130)
Bilirubin, Direct: 0.1 mg/dL (ref <=0.2)
Indirect Bilirubin: 0.5 mg/dL (ref 0.2–1.2)
Total Bilirubin: 0.6 mg/dL (ref 0.2–1.2)
Total Protein: 6.9 g/dL (ref 6.1–8.1)

## 2015-12-24 LAB — LIPID PANEL
Cholesterol: 241 mg/dL — ABNORMAL HIGH (ref 125–200)
HDL: 64 mg/dL (ref >=46)
LDL Cholesterol: 149 mg/dL — ABNORMAL HIGH (ref <130)
Total CHOL/HDL Ratio: 3.8 Ratio (ref <=5.0)
Triglycerides: 138 mg/dL (ref <150)
VLDL: 28 mg/dL (ref <30)

## 2015-12-24 LAB — IRON AND TIBC
%SAT: 37 % (ref 11–50)
Iron: 150 ug/dL (ref 45–160)
TIBC: 410 ug/dL (ref 250–450)
UIBC: 260 ug/dL (ref 125–400)

## 2015-12-24 LAB — BASIC METABOLIC PANEL WITH GFR
BUN: 12 mg/dL (ref 7–25)
CO2: 26 mmol/L (ref 20–31)
Calcium: 9.8 mg/dL (ref 8.6–10.4)
Chloride: 102 mmol/L (ref 98–110)
Creat: 0.96 mg/dL (ref 0.50–0.99)
GFR, Est African American: 72 mL/min (ref >=60)
GFR, Est Non African American: 63 mL/min (ref >=60)
Glucose, Bld: 98 mg/dL (ref 65–99)
Potassium: 4.4 mmol/L (ref 3.5–5.3)
Sodium: 141 mmol/L (ref 135–146)

## 2015-12-24 LAB — HEMOGLOBIN A1C
Hgb A1c MFr Bld: 5.3 % (ref <5.7)
Mean Plasma Glucose: 105 mg/dL

## 2015-12-24 LAB — TSH: TSH: 1.96 mIU/L

## 2015-12-24 LAB — MAGNESIUM: Magnesium: 2 mg/dL (ref 1.5–2.5)

## 2015-12-24 MED ORDER — VALACYCLOVIR HCL 1 G PO TABS: 1000.0000 mg | ORAL_TABLET | Freq: Every day | ORAL | 1 refills | Status: DC

## 2015-12-24 NOTE — Patient Instructions (Signed)
Preventive Care for Adults  A healthy lifestyle and preventive care can promote health and wellness. Preventive health guidelines for women include the following key practices.  A routine yearly physical is a good way to check with your health care provider about your health and preventive screening. It is a chance to share any concerns and updates on your health and to receive a thorough exam.  Visit your dentist for a routine exam and preventive care every 6 months. Brush your teeth twice a day and floss once a day. Good oral hygiene prevents tooth decay and gum disease.  The frequency of eye exams is based on your age, health, family medical history, use of contact lenses, and other factors. Follow your health care provider's recommendations for frequency of eye exams.  Eat a healthy diet. Foods like vegetables, fruits, whole grains, low-fat dairy products, and lean protein foods contain the nutrients you need without too many calories. Decrease your intake of foods high in solid fats, added sugars, and salt. Eat the right amount of calories for you.Get information about a proper diet from your health care provider, if necessary.  Regular physical exercise is one of the most important things you can do for your health. Most adults should get at least 150 minutes of moderate-intensity exercise (any activity that increases your heart rate and causes you to sweat) each week. In addition, most adults need muscle-strengthening exercises on 2 or more days a week.  Maintain a healthy weight. The body mass index (BMI) is a screening tool to identify possible weight problems. It provides an estimate of body fat based on height and weight. Your health care provider can find your BMI and can help you achieve or maintain a healthy weight.For adults 20 years and older:  A BMI below 18.5 is considered underweight.  A BMI of 18.5 to 24.9 is normal.  A BMI of 25 to 29.9 is considered overweight.  A BMI of  30 and above is considered obese.  Maintain normal blood lipids and cholesterol levels by exercising and minimizing your intake of saturated fat. Eat a balanced diet with plenty of fruit and vegetables. Blood tests for lipids and cholesterol should begin at age 20 and be repeated every 5 years. If your lipid or cholesterol levels are high, you are over 50, or you are at high risk for heart disease, you may need your cholesterol levels checked more frequently.Ongoing high lipid and cholesterol levels should be treated with medicines if diet and exercise are not working.  If you smoke, find out from your health care provider how to quit. If you do not use tobacco, do not start.  Lung cancer screening is recommended for adults aged 55-80 years who are at high risk for developing lung cancer because of a history of smoking. A yearly low-dose CT scan of the lungs is recommended for people who have at least a 30-pack-year history of smoking and are a current smoker or have quit within the past 15 years. A pack year of smoking is smoking an average of 1 pack of cigarettes a day for 1 year (for example: 1 pack a day for 30 years or 2 packs a day for 15 years). Yearly screening should continue until the smoker has stopped smoking for at least 15 years. Yearly screening should be stopped for people who develop a health problem that would prevent them from having lung cancer treatment.  High blood pressure causes heart disease and increases the risk of   stroke. Your blood pressure should be checked at least every 1 to 2 years. Ongoing high blood pressure should be treated with medicines if weight loss and exercise do not work.  If you are 55-79 years old, ask your health care provider if you should take aspirin to prevent strokes.  Diabetes screening involves taking a blood sample to check your fasting blood sugar level. This should be done once every 3 years, after age 45, if you are within normal weight and  without risk factors for diabetes. Testing should be considered at a younger age or be carried out more frequently if you are overweight and have at least 1 risk factor for diabetes.  Breast cancer screening is essential preventive care for women. You should practice "breast self-awareness." This means understanding the normal appearance and feel of your breasts and may include breast self-examination. Any changes detected, no matter how small, should be reported to a health care provider. Women in their 20s and 30s should have a clinical breast exam (CBE) by a health care provider as part of a regular health exam every 1 to 3 years. After age 40, women should have a CBE every year. Starting at age 40, women should consider having a mammogram (breast X-ray test) every year. Women who have a family history of breast cancer should talk to their health care provider about genetic screening. Women at a high risk of breast cancer should talk to their health care providers about having an MRI and a mammogram every year.  Breast cancer gene (BRCA)-related cancer risk assessment is recommended for women who have family members with BRCA-related cancers. BRCA-related cancers include breast, ovarian, tubal, and peritoneal cancers. Having family members with these cancers may be associated with an increased risk for harmful changes (mutations) in the breast cancer genes BRCA1 and BRCA2. Results of the assessment will determine the need for genetic counseling and BRCA1 and BRCA2 testing.  Routine pelvic exams to screen for cancer are no longer recommended for nonpregnant women who are considered low risk for cancer of the pelvic organs (ovaries, uterus, and vagina) and who do not have symptoms. Ask your health care provider if a screening pelvic exam is right for you.  If you have had past treatment for cervical cancer or a condition that could lead to cancer, you need Pap tests and screening for cancer for at least 20  years after your treatment. If Pap tests have been discontinued, your risk factors (such as having a new sexual partner) need to be reassessed to determine if screening should be resumed. Some women have medical problems that increase the chance of getting cervical cancer. In these cases, your health care provider may recommend more frequent screening and Pap tests.  Colorectal cancer can be detected and often prevented. Most routine colorectal cancer screening begins at the age of 50 years and continues through age 75 years. However, your health care provider may recommend screening at an earlier age if you have risk factors for colon cancer. On a yearly basis, your health care provider may provide home test kits to check for hidden blood in the stool. Use of a small camera at the end of a tube, to directly examine the colon (sigmoidoscopy or colonoscopy), can detect the earliest forms of colorectal cancer. Talk to your health care provider about this at age 50, when routine screening begins. Direct exam of the colon should be repeated every 5-10 years through age 75 years, unless early forms of pre-cancerous   polyps or small growths are found.  Hepatitis C blood testing is recommended for all people born from 1945 through 1965 and any individual with known risks for hepatitis C.  Pra  Osteoporosis is a disease in which the bones lose minerals and strength with aging. This can result in serious bone fractures or breaks. The risk of osteoporosis can be identified using a bone density scan. Women ages 65 years and over and women at risk for fractures or osteoporosis should discuss screening with their health care providers. Ask your health care provider whether you should take a calcium supplement or vitamin D to reduce the rate of osteoporosis.  Menopause can be associated with physical symptoms and risks. Hormone replacement therapy is available to decrease symptoms and risks. You should talk to your  health care provider about whether hormone replacement therapy is right for you.  Use sunscreen. Apply sunscreen liberally and repeatedly throughout the day. You should seek shade when your shadow is shorter than you. Protect yourself by wearing long sleeves, pants, a wide-brimmed hat, and sunglasses year round, whenever you are outdoors.  Once a month, do a whole body skin exam, using a mirror to look at the skin on your back. Tell your health care provider of new moles, moles that have irregular borders, moles that are larger than a pencil eraser, or moles that have changed in shape or color.  Stay current with required vaccines (immunizations).  Influenza vaccine. All adults should be immunized every year.  Tetanus, diphtheria, and acellular pertussis (Td, Tdap) vaccine. Pregnant women should receive 1 dose of Tdap vaccine during each pregnancy. The dose should be obtained regardless of the length of time since the last dose. Immunization is preferred during the 27th-36th week of gestation. An adult who has not previously received Tdap or who does not know her vaccine status should receive 1 dose of Tdap. This initial dose should be followed by tetanus and diphtheria toxoids (Td) booster doses every 10 years. Adults with an unknown or incomplete history of completing a 3-dose immunization series with Td-containing vaccines should begin or complete a primary immunization series including a Tdap dose. Adults should receive a Td booster every 10 years.  Varicella vaccine. An adult without evidence of immunity to varicella should receive 2 doses or a second dose if she has previously received 1 dose. Pregnant females who do not have evidence of immunity should receive the first dose after pregnancy. This first dose should be obtained before leaving the health care facility. The second dose should be obtained 4-8 weeks after the first dose.  Human papillomavirus (HPV) vaccine. Females aged 13-26 years  who have not received the vaccine previously should obtain the 3-dose series. The vaccine is not recommended for use in pregnant females. However, pregnancy testing is not needed before receiving a dose. If a female is found to be pregnant after receiving a dose, no treatment is needed. In that case, the remaining doses should be delayed until after the pregnancy. Immunization is recommended for any person with an immunocompromised condition through the age of 26 years if she did not get any or all doses earlier. During the 3-dose series, the second dose should be obtained 4-8 weeks after the first dose. The third dose should be obtained 24 weeks after the first dose and 16 weeks after the second dose.  Zoster vaccine. One dose is recommended for adults aged 60 years or older unless certain conditions are present.  Measles, mumps, and rubella (  MMR) vaccine. Adults born before [birth year] generally are considered immune to measles and mumps. Adults born in [birth year] or later should have 1 or more doses of MMR vaccine unless there is a contraindication to the vaccine or there is laboratory evidence of immunity to each of the three diseases. A routine second dose of MMR vaccine should be obtained at least 28 days after the first dose for students attending postsecondary schools, health care workers, or international travelers. People who received inactivated measles vaccine or an unknown type of measles vaccine during 1963-1967 should receive 2 doses of MMR vaccine. People who received inactivated mumps vaccine or an unknown type of mumps vaccine before 1979 and are at high risk for mumps infection should consider immunization with 2 doses of MMR vaccine. For females of childbearing age, rubella immunity should be determined. If there is no evidence of immunity, females who are not pregnant should be vaccinated. If there is no evidence of immunity, females who are pregnant should delay immunization until after pregnancy.  Unvaccinated health care workers born before [birth year] who lack laboratory evidence of measles, mumps, or rubella immunity or laboratory confirmation of disease should consider measles and mumps immunization with 2 doses of MMR vaccine or rubella immunization with 1 dose of MMR vaccine.  Pneumococcal 13-valent conjugate (PCV13) vaccine. When indicated, a person who is uncertain of her immunization history and has no record of immunization should receive the PCV13 vaccine. An adult aged 39 years or older who has certain medical conditions and has not been previously immunized should receive 1 dose of PCV13 vaccine. This PCV13 should be followed with a dose of pneumococcal polysaccharide (PPSV23) vaccine. The PPSV23 vaccine dose should be obtained at least 8 weeks after the dose of PCV13 vaccine. An adult aged 62 years or older who has certain medical conditions and previously received 1 or more doses of PPSV23 vaccine should receive 1 dose of PCV13. The PCV13 vaccine dose should be obtained 1 or more years after the last PPSV23 vaccine dose.    Pneumococcal polysaccharide (PPSV23) vaccine. When PCV13 is also indicated, PCV13 should be obtained first. All adults aged 67 years and older should be immunized. An adult younger than age 45 years who has certain medical conditions should be immunized. Any person who resides in a nursing home or long-term care facility should be immunized. An adult smoker should be immunized. People with an immunocompromised condition and certain other conditions should receive both PCV13 and PPSV23 vaccines. People with human immunodeficiency virus (HIV) infection should be immunized as soon as possible after diagnosis. Immunization during chemotherapy or radiation therapy should be avoided. Routine use of PPSV23 vaccine is not recommended for American Indians, [place] Natives, or people younger than 65 years unless there are medical conditions that require PPSV23 vaccine. When indicated,  people who have unknown immunization and have no record of immunization should receive PPSV23 vaccine. One-time revaccination 5 years after the first dose of PPSV23 is recommended for people aged 19-64 years who have chronic kidney failure, nephrotic syndrome, asplenia, or immunocompromised conditions. People who received 1-2 doses of PPSV23 before age 23 years should receive another dose of PPSV23 vaccine at age 35 years or later if at least 5 years have passed since the previous dose. Doses of PPSV23 are not needed for people immunized with PPSV23 at or after age 38 years.  Preventive Services / Frequency   Ages 43 to 86 years  Blood pressure check.  Lipid and cholesterol check.  Lung  cancer screening. / Every year if you are aged 64-80 years and have a 30-pack-year history of smoking and currently smoke or have quit within the past 15 years. Yearly screening is stopped once you have quit smoking for at least 15 years or develop a health problem that would prevent you from having lung cancer treatment.  Clinical breast exam.** / Every year after age 36 years.  BRCA-related cancer risk assessment.** / For women who have family members with a BRCA-related cancer (breast, ovarian, tubal, or peritoneal cancers).  Mammogram.** / Every year beginning at age 109 years and continuing for as long as you are in good health. Consult with your health care provider.  Pap test.** / Every 3 years starting at age 39 years through age 65 or 28 years with a history of 3 consecutive normal Pap tests.  HPV screening.** / Every 3 years from ages 91 years through ages 32 to 78 years with a history of 3 consecutive normal Pap tests.  Fecal occult blood test (FOBT) of stool. / Every year beginning at age 26 years and continuing until age 12 years. You may not need to do this test if you get a colonoscopy every 10 years.  Flexible sigmoidoscopy or colonoscopy.** / Every 5 years for a flexible sigmoidoscopy or  every 10 years for a colonoscopy beginning at age 34 years and continuing until age 60 years.  Hepatitis C blood test.** / For all people born from [birth year] through 1965 and any individual with known risks for hepatitis C.  Skin self-exam. / Monthly.  Influenza vaccine. / Every year.  Tetanus, diphtheria, and acellular pertussis (Tdap/Td) vaccine.** / Consult your health care provider. Pregnant women should receive 1 dose of Tdap vaccine during each pregnancy. 1 dose of Td every 10 years.  Varicella vaccine.** / Consult your health care provider. Pregnant females who do not have evidence of immunity should receive the first dose after pregnancy.  Zoster vaccine.** / 1 dose for adults aged 11 years or older.  Pneumococcal 13-valent conjugate (PCV13) vaccine.** / Consult your health care provider.  Pneumococcal polysaccharide (PPSV23) vaccine.** / 1 to 2 doses if you smoke cigarettes or if you have certain conditions.  Meningococcal vaccine.** / Consult your health care provider.  Hepatitis A vaccine.** / Consult your health care provider.  Hepatitis B vaccine.** / Consult your health care provider. Screening for abdominal aortic aneurysm (AAA)  by ultrasound is recommended for people over 50 who have history of high blood pressure or who are current or former smokers. ++++++++++++++++++ Recommend Adult Low Dose Aspirin or  coated  Aspirin 81 mg daily  To reduce risk of Colon Cancer 20 %,  Skin Cancer 26 % ,  Melanoma 46%  and  Pancreatic cancer 60% +++++++++++++++++++ Vitamin D goal  is between 70-100.  Please make sure that you are taking your Vitamin D as directed.  It is very important as a natural anti-inflammatory  helping hair, skin, and nails, as well as reducing stroke and heart attack risk.  It helps your bones and helps with mood. It also decreases numerous cancer risks so please take it as directed.  Low Vit D is associated with a 200-300% higher risk for CANCER  and  200-300% higher risk for HEART   ATTACK  &  STROKE.   .....................................Marland Kitchen It is also associated with higher death rate at younger ages,  autoimmune diseases like Rheumatoid arthritis, Lupus, Multiple Sclerosis.    Also many other serious conditions, like depression,  Alzheimer's Dementia, infertility, muscle aches, fatigue, fibromyalgia - just to name a few. ++++++++++++++++++ Recommend the book "The END of DIETING" by Dr Excell Seltzer  & the book "The END of DIABETES " by Dr Excell Seltzer At [email or web address] - get book & Audio CD's    Being diabetic has a  300% increased risk for heart attack, stroke, cancer, and alzheimer- type vascular dementia. It is very important that you work harder with diet by avoiding all foods that are white. Avoid white rice (brown & wild rice is OK), white potatoes (sweetpotatoes in moderation is OK), White bread or wheat bread or anything made out of white flour like bagels, donuts, rolls, buns, biscuits, cakes, pastries, cookies, pizza crust, and pasta (made from white flour & egg whites) - vegetarian pasta or spinach or wheat pasta is OK. Multigrain breads like Arnold's or [employer], or multigrain sandwich thins or flatbreads.  Diet, exercise and weight loss can reverse and cure diabetes in the early stages.  Diet, exercise and weight loss is very important in the control and prevention of complications of diabetes which affects every system in your body, ie. Brain - dementia/stroke, eyes - glaucoma/blindness, heart - heart attack/heart failure, kidneys - dialysis, stomach - gastric paralysis, intestines - malabsorption, nerves - severe painful neuritis, circulation - gangrene & loss of a leg(s), and finally cancer and Alzheimers.    I recommend avoid fried & greasy foods,  sweets/candy, white rice (brown or wild rice or Quinoa is OK), white potatoes (sweet potatoes are OK) - anything made from white flour - bagels, doughnuts, rolls, buns, biscuits,white  and wheat breads, pizza crust and traditional pasta made of white flour & egg white(vegetarian pasta or spinach or wheat pasta is OK).  Multi-grain bread is OK - like multi-grain flat bread or sandwich thins. Avoid alcohol in excess. Exercise is also important.    Eat all the vegetables you want - avoid meat, especially red meat and dairy - especially cheese.  Cheese is the most concentrated form of trans-fats which is the worst thing to clog up our arteries. Veggie cheese is OK which can be found in the fresh produce section at [employer] or [employer] or [employer]  ++++++++++++++++++++++ DASH Eating Plan  DASH stands for "Dietary Approaches to Stop Hypertension."   The DASH eating plan is a healthy eating plan that has been shown to reduce high blood pressure (hypertension). Additional health benefits may include reducing the risk of type 2 diabetes mellitus, heart disease, and stroke. The DASH eating plan may also help with weight loss. WHAT DO I NEED TO KNOW ABOUT THE DASH EATING PLAN? For the DASH eating plan, you will follow these general guidelines:  Choose foods with a percent daily value for sodium of less than 5% (as listed on the food label).  Use salt-free seasonings or herbs instead of table salt or sea salt.  Check with your health care provider or pharmacist before using salt substitutes.  Eat lower-sodium products, often labeled as "lower sodium" or "no salt added."  Eat fresh foods.  Eat more vegetables, fruits, and low-fat dairy products.  Choose whole grains. Look for the word "whole" as the first word in the ingredient list.  Choose fish   Limit sweets, desserts, sugars, and sugary drinks.  Choose heart-healthy fats.  Eat veggie cheese   Eat more home-cooked food and less restaurant, buffet, and fast food.  Limit fried foods.  Cook foods using methods other than frying.  Limit canned  vegetables. If you do use them, rinse them well to decrease the  sodium.  When eating at a restaurant, ask that your food be prepared with less salt, or no salt if possible.                      WHAT FOODS CAN I EAT? Read Dr Joel Fuhrman's books on The End of Dieting & The End of Diabetes  Grains Whole grain or whole wheat bread. Brown rice. Whole grain or whole wheat pasta. Quinoa, bulgur, and whole grain cereals. Low-sodium cereals. Corn or whole wheat flour tortillas. Whole grain cornbread. Whole grain crackers. Low-sodium crackers.  Vegetables Fresh or frozen vegetables (raw, steamed, roasted, or grilled). Low-sodium or reduced-sodium tomato and vegetable juices. Low-sodium or reduced-sodium tomato sauce and paste. Low-sodium or reduced-sodium canned vegetables.   Fruits All fresh, canned (in natural juice), or frozen fruits.  Protein Products  All fish and seafood.  Dried beans, peas, or lentils. Unsalted nuts and seeds. Unsalted canned beans.  Dairy Low-fat dairy products, such as skim or 1% milk, 2% or reduced-fat cheeses, low-fat ricotta or cottage cheese, or plain low-fat yogurt. Low-sodium or reduced-sodium cheeses.  Fats and Oils Tub margarines without trans fats. Light or reduced-fat mayonnaise and salad dressings (reduced sodium). Avocado. Safflower, olive, or canola oils. Natural peanut or almond butter.  Other Unsalted popcorn and pretzels. The items listed above may not be a complete list of recommended foods or beverages. Contact your dietitian for more options.  ++++++++++++++++++  WHAT FOODS ARE NOT RECOMMENDED? Grains/ White flour or wheat flour White bread. White pasta. White rice. Refined cornbread. Bagels and croissants. Crackers that contain trans fat.  Vegetables  Creamed or fried vegetables. Vegetables in a . Regular canned vegetables. Regular canned tomato sauce and paste. Regular tomato and vegetable juices.  Fruits Dried fruits. Canned fruit in light or heavy syrup. Fruit juice.  Meat and Other Protein  Products Meat in general - RED meat & White meat.  Fatty cuts of meat. Ribs, chicken wings, all processed meats as bacon, sausage, bologna, salami, fatback, hot dogs, bratwurst and packaged luncheon meats.  Dairy Whole or 2% milk, cream, half-and-half, and cream cheese. Whole-fat or sweetened yogurt. Full-fat cheeses or blue cheese. Non-dairy creamers and whipped toppings. Processed cheese, cheese spreads, or cheese curds.  Condiments Onion and garlic salt, seasoned salt, table salt, and sea salt. Canned and packaged gravies. Worcestershire sauce. Tartar sauce. Barbecue sauce. Teriyaki sauce. Soy sauce, including reduced sodium. Steak sauce. Fish sauce. Oyster sauce. Cocktail sauce. Horseradish. Ketchup and mustard. Meat flavorings and tenderizers. Bouillon cubes. Hot sauce. Tabasco sauce. Marinades. Taco seasonings. Relishes.  Fats and Oils Butter, stick margarine, lard, shortening and bacon fat. Coconut, palm kernel, or palm oils. Regular salad dressings.  Pickles and olives. Salted popcorn and pretzels.  The items listed above may not be a complete list of foods and beverages to avoid.    

## 2015-12-24 NOTE — Progress Notes (Signed)
Complete Physical  Assessment and Plan:  1. Encounter for general adult medical examination with abnormal findings  - CBC with Differential/Platelet - BASIC METABOLIC PANEL WITH GFR - Hepatic function panel - Magnesium  2. Essential hypertension  - EKG 12-Lead - TSH  3. Postmenopausal atrophic vaginitis -followed by Dr. Sabra Heck  4. Mixed hyperlipidemia -has been elevated recently -may need red yeast rice vs. statin - Lipid panel  5. Prediabetes  - Hemoglobin A1c - Insulin, random  6. Vitamin D deficiency Cont supplement - VITAMIN D 25 Hydroxy (Vit-D Deficiency, Fractures)  7. Medication management   8. Need for prophylactic vaccination and inoculation against influenza  - Flu Vaccine QUAD with presevative  9. Screening for deficiency anemia  - Iron and TIBC - Vitamin B12  10. Screening for hematuria or proteinuria  - Urinalysis, Routine w reflex microscopic (not at [facility]) - Microalbumin / creatinine urine ratio    Discussed med's effects and SE's. Screening labs and tests as requested with regular follow-up as recommended.  HPI  65 y.o. female  presents for a complete physical.  Her blood pressure has been controlled at home, today their BP is  .  She does workout. She denies chest pain, shortness of breath, dizziness.   She is on cholesterol medication and denies myalgias. Her cholesterol is at goal. The cholesterol last visit was:  Lab Results  Component Value Date   CHOL 194 07/06/2015   HDL 59 07/06/2015   LDLCALC 101 07/06/2015   TRIG 170 (H) 07/06/2015   CHOLHDL 3.3 07/06/2015  .  She has been working on diet and exercise for prediabetes, she is on bASA, she is not on ACE/ARB and denies foot ulcerations, hyperglycemia, hypoglycemia , increased appetite, nausea, paresthesia of the feet, polydipsia, polyuria, visual disturbances, vomiting and weight loss. Last A1C in the office was:  Lab Results  Component Value Date   HGBA1C 5.5 07/06/2015     Patient is on Vitamin D supplement.   Lab Results  Component Value Date   VD25OH 36 12/24/2014     She reports that she is very concerned that she has a bigger lump on the right side of her neck.    She reports that she is also still struggling with her weight.  She reports that she is walking the dog.  She reports that she is going 2 miles at a minimum.  She reports that she is wearing a pedometer but she struggles with getting 10,000.    Diet is not having some issues with staying on top of fruits and veggies.  She reports that she is eating a little bit more carbohydrates.  She reports that she does mostly do stir frys and salads.  She reports that she does not do a lot of sweets.  She reports that she does feel like she is having some tighter fitting clothes.    She did recently go to see Dr. Sabra Heck.  She reports that she had a normal visit.     Current Medications:  Current Outpatient Prescriptions on File Prior to Visit  Medication Sig Dispense Refill  . amLODipine (NORVASC) 2.5 MG tablet Take 1 tablet by mouth  daily 90 tablet 2  . amLODipine (NORVASC) 5 MG tablet Take 5 mg by mouth.    . B Complex Vitamins (VITAMIN B COMPLEX PO) Take by mouth.    . Cetirizine HCl (ZYRTEC ALLERGY PO) Take by mouth.    . Cholecalciferol (VITAMIN D PO) Take 5,000 Units by  mouth daily.    . diphenhydrAMINE (BENADRYL) 25 MG tablet Take 25 mg by mouth every 6 (six) hours as needed.    Marland Kitchen estradiol (ESTRACE) 0.1 MG/GM vaginal cream Apply externally as directed 42.5 g 0  . Estradiol (VAGIFEM) 10 MCG TABS vaginal tablet Place 1 tablet (10 mcg total) vaginally 2 (two) times a week. 24 tablet 3  . GLUCOSAMINE-CHONDROITIN PO Take 1,500 Units by mouth daily.     Marland Kitchen MAGNESIUM PO Take 800 Units by mouth daily.     Marland Kitchen MELATONIN PO Take by mouth daily. Reported on 08/13/2015    . Multiple Vitamins-Minerals (HAIR/SKIN/NAILS PO) Take 3 tablets by mouth daily.    . Omega-3 Fatty Acids (FISH OIL PO) Take 1,200 mg by  mouth.     . rizatriptan (MAXALT) 10 MG tablet Take 1 tablet (10 mg total) by mouth once as needed for migraine. May repeat in 2 hours if needed 30 tablet 2  . rosuvastatin (CRESTOR) 40 MG tablet Take 1 tablet by mouth  every day for cholesterol 90 tablet 1  . spironolactone (ALDACTONE) 100 MG tablet Take 2 tablets (200 mg total) by mouth daily. 180 tablet 4  . valACYclovir (VALTREX) 1000 MG tablet Take 1 tablet (1,000 mg total) by mouth daily. 90 tablet 1   No current facility-administered medications on file prior to visit.     Health Maintenance:   Immunization History  Administered Date(s) Administered  . Influenza Split 12/23/2013, 12/24/2014  . Influenza Whole 12/16/2012  . Pneumococcal Polysaccharide-23 08/24/2004  . Tdap 11/28/2010  . Zoster 01/19/2012    Tetanus:  2012 Pneumovax: 2006 Flu vaccine: 2017 Zostavax: 2013 MGM: 02/02/15 DEXA: 11/15 Colonoscopy: 2016 Opthalmology:  Dr. Harle Stanford, and Dr. Annamaria Boots  Patient Care Team: Unk Pinto, MD as PCP - General (Internal Medicine) Megan Salon, MD as Consulting Physician (Gynecology) Carlena Bjornstad, MD as Consulting Physician (Cardiology) Inda Castle, MD as Consulting Physician (Gastroenterology) Monna Fam, MD as Consulting Physician (Ophthalmology) Gerarda Fraction, MD as Referring Physician (Ophthalmology) Glenna Fellows, MD as Attending Physician (Neurosurgery) Devra Dopp, MD as Referring Physician (Dermatology) Marybelle Killings, MD as Consulting Physician (Orthopedic Surgery)  Allergies:  Allergies  Allergen Reactions  . Bactrim [Sulfamethoxazole-Trimethoprim] Itching  . Lipitor [Atorvastatin] Other (See Comments)    headache  . Monistat [Miconazole] Itching    burning    Medical History:  Past Medical History:  Diagnosis Date  . AC (acromioclavicular) joint bone spurs   . Anemia   . Anxiety   . Degenerative arthritis   . Depression   . GERD (gastroesophageal reflux disease)   . Goiter     multi nodular   . History of PCOS   . Hyperlipidemia   . Hypertension   . Nevus of left eye   . PCOS (polycystic ovarian syndrome)     Surgical History:  Past Surgical History:  Procedure Laterality Date  . BUNIONECTOMY Bilateral 2006   Feet  . HERNIA REPAIR Right 1959  . TOE SURGERY Left 2006   Bone Spur  . TONSILLECTOMY AND ADENOIDECTOMY  1959  . TUBAL LIGATION  1995    Family History:  Family History  Problem Relation Age of Onset  . Thyroid disease Mother     hyperthyroidism  . Hypertension Mother   . Cancer Mother   . Hyperlipidemia Mother   . Cancer Father   . Heart disease Brother   . Hyperlipidemia Brother   . Stroke Paternal Grandmother   . Cancer  Brother 19    sarcoma  . Colon cancer Neg Hx   . Esophageal cancer Neg Hx   . Rectal cancer Neg Hx   . Stomach cancer Neg Hx     Social History:  Social History  Substance Use Topics  . Smoking status: Never Smoker  . Smokeless tobacco: Never Used  . Alcohol use 0.6 oz/week    1 Glasses of wine per week     Comment: occasionally wine    Review of Systems: Review of Systems  Constitutional: Negative for chills, fever and malaise/fatigue.  HENT: Negative for congestion, ear pain and sore throat.   Eyes: Negative.   Respiratory: Negative for cough, shortness of breath and wheezing.   Cardiovascular: Negative for chest pain, palpitations and leg swelling.  Gastrointestinal: Negative for abdominal pain, blood in stool, constipation, diarrhea, heartburn and melena.  Genitourinary: Negative.   Skin: Negative.   Neurological: Negative for dizziness, sensory change, loss of consciousness and headaches.  Psychiatric/Behavioral: Negative for depression. The patient is not nervous/anxious and does not have insomnia.     Physical Exam: Estimated body mass index is 23.57 kg/m as calculated from the following:   Height as of this encounter: 5\' 6"  (1.676 m).   Weight as of this encounter: 146 lb (66.2  kg). Resp 16   Ht 5\' 6"  (1.676 m)   Wt 146 lb (66.2 kg)   LMP 12/05/2010   BMI 23.57 kg/m     Wt Readings from Last 3 Encounters:  12/24/15 146 lb (66.2 kg)  08/13/15 146 lb (66.2 kg)  07/06/15 143 lb (64.9 kg)    General Appearance: Well nourished well developed, in no apparent distress.  Eyes: PERRLA, EOMs, conjunctiva no swelling or erythema ENT/Mouth: Ear canals normal without obstruction, swelling, erythema, or discharge.  TMs normal bilaterally with no erythema, bulging, retraction, or loss of landmark.  Oropharynx moist and clear with no exudate, erythema, or swelling.   Neck: Supple, thyroid normal. No bruits.  No cervical adenopathy Respiratory: Respiratory effort normal, Breath sounds clear A&P without wheeze, rhonchi, rales.   Cardio: RRR without murmurs, rubs or gallops. Brisk peripheral pulses without edema.  Chest: symmetric, with normal excursions Breasts: Symmetric, without lumps, nipple discharge, retractions.  Abdomen: Soft, nontender, no guarding, rebound, hernias, masses, or organomegaly.  Lymphatics: Non tender without lymphadenopathy.  Genitourinary: Deferred to Gyn for yearly checkups Musculoskeletal: Full ROM all peripheral extremities,5/5 strength, and normal gait.  Skin: Warm, dry without rashes, lesions, ecchymosis. Neuro: Awake and oriented X 3, Cranial nerves intact, reflexes equal bilaterally. Normal muscle tone, no cerebellar symptoms. Sensation intact.  Psych:  normal affect, Insight and Judgment appropriate.   EKG: WNL no changes.  Over 40 minutes of exam, counseling, chart review and critical decision making was performed  Starlyn Skeans 9:35 AM [facility]

## 2015-12-25 LAB — URINALYSIS, ROUTINE W REFLEX MICROSCOPIC
Bilirubin Urine: NEGATIVE
Glucose, UA: NEGATIVE
Hgb urine dipstick: NEGATIVE
Ketones, ur: NEGATIVE
Leukocytes, UA: NEGATIVE
Nitrite: NEGATIVE
Protein, ur: NEGATIVE
Specific Gravity, Urine: 1.012 (ref 1.001–1.035)
pH: 6.5 (ref 5.0–8.0)

## 2015-12-25 LAB — VITAMIN D 25 HYDROXY (VIT D DEFICIENCY, FRACTURES): Vit D, 25-Hydroxy: 40 ng/mL (ref 30–100)

## 2015-12-25 LAB — INSULIN, RANDOM: Insulin: 5.7 u[IU]/mL (ref 2.0–19.6)

## 2015-12-25 LAB — MICROALBUMIN / CREATININE URINE RATIO
Creatinine, Urine: 89 mg/dL (ref 20–320)
Microalb Creat Ratio: 2 mcg/mg creat (ref <30)
Microalb, Ur: 0.2 mg/dL

## 2015-12-28 ENCOUNTER — Other Ambulatory Visit: Payer: Self-pay | Admitting: Obstetrics & Gynecology

## 2015-12-28 DIAGNOSIS — Z1231 Encounter for screening mammogram for malignant neoplasm of breast: Secondary | ICD-10-CM

## 2016-02-04 ENCOUNTER — Ambulatory Visit
Admission: RE | Admit: 2016-02-04 | Discharge: 2016-02-04 | Disposition: A | Discharge status: Home / Self Care | Payer: 59 | Source: Ambulatory Visit | Attending: Obstetrics & Gynecology | Admitting: Obstetrics & Gynecology

## 2016-02-04 DIAGNOSIS — Z1231 Encounter for screening mammogram for malignant neoplasm of breast: Secondary | ICD-10-CM

## 2016-06-06 ENCOUNTER — Encounter: Payer: Self-pay | Admitting: Internal Medicine

## 2016-06-06 ENCOUNTER — Ambulatory Visit (INDEPENDENT_AMBULATORY_CARE_PROVIDER_SITE_OTHER): Payer: 59 | Admitting: Internal Medicine

## 2016-06-06 VITALS — BP 118/68 | HR 76 | Temp 98.4°F | Resp 16 | Ht 66.0 in

## 2016-06-06 DIAGNOSIS — J069 Acute upper respiratory infection, unspecified: Secondary | ICD-10-CM

## 2016-06-06 MED ORDER — PROMETHAZINE-DM 6.25-15 MG/5ML PO SYRP: ORAL_SOLUTION | ORAL | 1 refills | Status: DC

## 2016-06-06 MED ORDER — ALBUTEROL SULFATE HFA 108 (90 BASE) MCG/ACT IN AERS: 2.0000 | INHALATION_SPRAY | Freq: Four times a day (QID) | RESPIRATORY_TRACT | 0 refills | Status: DC | PRN

## 2016-06-06 MED ORDER — DOXYCYCLINE HYCLATE 100 MG PO CAPS: 100.0000 mg | ORAL_CAPSULE | Freq: Two times a day (BID) | ORAL | 0 refills | Status: DC

## 2016-06-06 MED ORDER — PREDNISONE 20 MG PO TABS: ORAL_TABLET | ORAL | 0 refills | Status: DC

## 2016-06-06 NOTE — Progress Notes (Signed)
HPI  Patient presents to the office for evaluation of cough.  It has been going on for 1 weeks.  Patient reports night > day, dry, barky, worse with lying down.  They also endorse change in voice, chills, fever, postnasal drip, shortness of breath and myalgias, temps between 100-102, severe fatigue, nasal congestion, post nasal drainage, chest tightness..  They have tried antipyretic, tessalon, mucinex and benadryl.  They report that nothing has worked.  They admits to other sick contacts.  Her husband is now sick.    Review of Systems  Constitutional: Positive for malaise/fatigue. Negative for chills and fever.  HENT: Positive for congestion, ear pain, hearing loss and sore throat.   Respiratory: Positive for cough. Negative for sputum production, shortness of breath and wheezing.   Cardiovascular: Negative for chest pain, palpitations and leg swelling.  Musculoskeletal: Positive for myalgias.  Neurological: Positive for headaches.    PE:  Vitals:   06/06/16 0908  BP: 118/68  Pulse: 76  Resp: 16  Temp: 98.4 F (36.9 C)    General:  Alert and non-toxic, WDWN, NAD HEENT: NCAT, PERLA, EOM normal, no occular discharge or erythema.  Nasal mucosal edema with sinus tenderness to palpation.  Oropharynx clear with minimal oropharyngeal edema and erythema.  Mucous membranes moist and pink. Neck:  Cervical adenopathy Chest:  RRR no MRGs.  Lungs clear to auscultation A&P with no wheezes rhonchi or rales.   Abdomen: +BS x 4 quadrants, soft, non-tender, no guarding, rigidity, or rebound. Skin: warm and dry no rash Neuro: A&Ox4, CN II-XII grossly intact  Assessment and Plan:   1. Acute URI  -decadron -doxycycline -albuterol -phenergan dm -tessalon -flonase -nasal saline -cont benadryl at bedtime -cont singualir

## 2016-06-26 NOTE — Progress Notes (Signed)
Assessment and Plan:  Hypertension:  -Continue medication,  -monitor blood pressure at home.  -Continue DASH diet.   -Reminder to go to the ER if any CP, SOB, nausea, dizziness, severe HA, changes vision/speech, left arm numbness and tingling, and jaw pain.  Cholesterol: -Continue diet and exercise.  -Check cholesterol.  - discussed zetia   Vitamin D Def: -check level -continue medications.   Continue diet and meds as discussed. Further disposition pending results of labs. Future Appointments Date Time Provider Department Center  11/21/2016 12:45 PM Megan Salon, MD [place] None  12/26/2016 9:00 AM Starlyn Skeans, PA-C [facility] None    HPI 66 y.o. female  presents for 3 month follow up with hypertension, hyperlipidemia, prediabetes and vitamin D.   Her blood pressure has been controlled at home, today their BP is BP: 120/72.   She does workout. She denies chest pain, shortness of breath, dizziness.   She is on cholesterol medication and denies myalgias, when she was on a higher dose of crestor she had a grade 2 muscle tear. Her cholesterol is at goal. The cholesterol last visit was:   Lab Results  Component Value Date   CHOL 241 (H) 12/24/2015   HDL 64 12/24/2015   LDLCALC 149 (H) 12/24/2015   TRIG 138 12/24/2015   CHOLHDL 3.8 12/24/2015    She has been working on diet and exercise for prediabetes, and denies foot ulcerations, hyperglycemia, hypoglycemia , increased appetite, nausea, paresthesia of the feet, polydipsia, polyuria, visual disturbances, vomiting and weight loss. Last A1C in the office was:  Lab Results  Component Value Date   HGBA1C 5.3 12/24/2015   Patient is on Vitamin D supplement.  Lab Results  Component Value Date   VD25OH 40 12/24/2015     BMI is Body mass index is 23.24 kg/m., she is working on diet and exercise. Wt Readings from Last 3 Encounters:  06/27/16 144 lb (65.3 kg)  12/24/15 146 lb (66.2 kg)  08/13/15 146 lb (66.2 kg)      Current Medications:  Current Outpatient Prescriptions on File Prior to Visit  Medication Sig Dispense Refill  . amLODipine (NORVASC) 2.5 MG tablet Take 1 tablet by mouth  daily 90 tablet 2  . B Complex Vitamins (VITAMIN B COMPLEX PO) Take by mouth.    . Cetirizine HCl (ZYRTEC ALLERGY PO) Take by mouth.    . Cholecalciferol (VITAMIN D PO) Take 5,000 Units by mouth daily.    . COD LIVER OIL PO Take by mouth daily.    . diphenhydrAMINE (BENADRYL) 25 MG tablet Take 25 mg by mouth every 6 (six) hours as needed.    Marland Kitchen estradiol (ESTRACE) 0.1 MG/GM vaginal cream Apply externally as directed 42.5 g 0  . Estradiol (VAGIFEM) 10 MCG TABS vaginal tablet Place 1 tablet (10 mcg total) vaginally 2 (two) times a week. 24 tablet 3  . GLUCOSAMINE-CHONDROITIN PO Take 1,500 Units by mouth daily.     Marland Kitchen MAGNESIUM PO Take 800 Units by mouth daily.     Marland Kitchen MELATONIN PO Take by mouth daily. Reported on 08/13/2015    . Multiple Vitamins-Minerals (HAIR/SKIN/NAILS PO) Take 3 tablets by mouth daily.    . Omega-3 Fatty Acids (FISH OIL PO) Take 1,200 mg by mouth.     . rosuvastatin (CRESTOR) 40 MG tablet Take 1 tablet by mouth  every day for cholesterol 90 tablet 1  . spironolactone (ALDACTONE) 100 MG tablet Take 2 tablets (200 mg total) by mouth daily. 180 tablet 4  .  valACYclovir (VALTREX) 1000 MG tablet Take 1 tablet (1,000 mg total) by mouth daily. 90 tablet 1   No current facility-administered medications on file prior to visit.     Medical History:  Past Medical History:  Diagnosis Date  . AC (acromioclavicular) joint bone spurs   . Anemia   . Anxiety   . Degenerative arthritis   . Depression   . GERD (gastroesophageal reflux disease)   . Goiter    multi nodular   . History of PCOS   . Hyperlipidemia   . Hypertension   . Nevus of left eye   . PCOS (polycystic ovarian syndrome)     Allergies:  Allergies  Allergen Reactions  . Bactrim [Sulfamethoxazole-Trimethoprim] Itching  . Lipitor  [Atorvastatin] Other (See Comments)    headache  . Monistat [Miconazole] Itching    burning     Review of Systems:  Review of Systems  Constitutional: Negative for chills, fever and malaise/fatigue.  HENT: Negative for congestion, ear pain and sore throat.   Eyes: Negative.   Respiratory: Negative for cough, shortness of breath and wheezing.   Cardiovascular: Negative for chest pain, palpitations and leg swelling.  Gastrointestinal: Negative for abdominal pain, blood in stool, constipation, diarrhea, heartburn and melena.  Genitourinary: Negative.   Skin: Negative.   Neurological: Negative for dizziness, sensory change, loss of consciousness and headaches.  Psychiatric/Behavioral: Negative for depression. The patient is not nervous/anxious and does not have insomnia.     Family history- Review and unchanged  Social history- Review and unchanged  Physical Exam: BP 120/72   Pulse 63   Temp 97.2 F (36.2 C)   Resp 14   Ht 5\' 6"  (1.676 m)   Wt 144 lb (65.3 kg)   LMP 12/05/2010   SpO2 99%   BMI 23.24 kg/m  Wt Readings from Last 3 Encounters:  06/27/16 144 lb (65.3 kg)  12/24/15 146 lb (66.2 kg)  08/13/15 146 lb (66.2 kg)    General Appearance: Well nourished well developed, in no apparent distress. Eyes: PERRLA, EOMs, conjunctiva no swelling or erythema ENT/Mouth: Ear canals normal without obstruction, swelling, erythma, discharge.  TMs normal bilaterally.  Oropharynx moist, clear, without exudate, or postoropharyngeal swelling. Neck: Supple, thyroid normal,no cervical adenopathy  Respiratory: Respiratory effort normal, Breath sounds clear A&P without rhonchi, wheeze, or rale.  No retractions, no accessory usage. Cardio: RRR with no MRGs. Brisk peripheral pulses without edema.  Abdomen: Soft, + BS,  Non tender, no guarding, rebound, hernias, masses. Musculoskeletal: Full ROM, 5/5 strength, Normal gait Skin: Warm, dry without rashes, lesions, ecchymosis.  Neuro: Awake and  oriented X 3, Cranial nerves intact. Normal muscle tone, no cerebellar symptoms. Psych: Normal affect, Insight and Judgment appropriate.    Vicie Mutters, PA-C 9:43 AM [facility]

## 2016-06-27 ENCOUNTER — Ambulatory Visit (INDEPENDENT_AMBULATORY_CARE_PROVIDER_SITE_OTHER): Payer: 59 | Admitting: Physician Assistant

## 2016-06-27 ENCOUNTER — Ambulatory Visit: Payer: Self-pay | Admitting: Internal Medicine

## 2016-06-27 ENCOUNTER — Encounter: Payer: Self-pay | Admitting: Physician Assistant

## 2016-06-27 VITALS — BP 120/72 | HR 63 | Temp 97.2°F | Resp 14 | Ht 66.0 in | Wt 144.0 lb

## 2016-06-27 DIAGNOSIS — Z79899 Other long term (current) drug therapy: Secondary | ICD-10-CM | POA: Diagnosis not present

## 2016-06-27 DIAGNOSIS — I10 Essential (primary) hypertension: Secondary | ICD-10-CM | POA: Diagnosis not present

## 2016-06-27 DIAGNOSIS — E782 Mixed hyperlipidemia: Secondary | ICD-10-CM | POA: Diagnosis not present

## 2016-06-27 LAB — CBC WITH DIFFERENTIAL/PLATELET
Basophils Absolute: 52 cells/uL (ref 0–200)
Basophils Relative: 1 %
Eosinophils Absolute: 104 cells/uL (ref 15–500)
Eosinophils Relative: 2 %
HCT: 42.9 % (ref 35.0–45.0)
Hemoglobin: 14.1 g/dL (ref 11.7–15.5)
Lymphocytes Relative: 25 %
Lymphs Abs: 1300 cells/uL (ref 850–3900)
MCH: 29.9 pg (ref 27.0–33.0)
MCHC: 32.9 g/dL (ref 32.0–36.0)
MCV: 90.9 fL (ref 80.0–100.0)
MPV: 9.7 fL (ref 7.5–12.5)
Monocytes Absolute: 468 cells/uL (ref 200–950)
Monocytes Relative: 9 %
Neutro Abs: 3276 cells/uL (ref 1500–7800)
Neutrophils Relative %: 63 %
Platelets: 258 10*3/uL (ref 140–400)
RBC: 4.72 MIL/uL (ref 3.80–5.10)
RDW: 13.5 % (ref 11.0–15.0)
WBC: 5.2 10*3/uL (ref 3.8–10.8)

## 2016-06-27 LAB — LIPID PANEL
Cholesterol: 208 mg/dL — ABNORMAL HIGH (ref <200)
HDL: 62 mg/dL (ref >50)
LDL Cholesterol: 102 mg/dL — ABNORMAL HIGH (ref <100)
Total CHOL/HDL Ratio: 3.4 Ratio (ref <5.0)
Triglycerides: 220 mg/dL — ABNORMAL HIGH (ref <150)
VLDL: 44 mg/dL — ABNORMAL HIGH (ref <30)

## 2016-06-27 LAB — BASIC METABOLIC PANEL WITH GFR
BUN: 14 mg/dL (ref 7–25)
CO2: 22 mmol/L (ref 20–31)
Calcium: 9.5 mg/dL (ref 8.6–10.4)
Chloride: 100 mmol/L (ref 98–110)
Creat: 0.98 mg/dL (ref 0.50–0.99)
GFR, Est African American: 71 mL/min (ref >=60)
GFR, Est Non African American: 61 mL/min (ref >=60)
Glucose, Bld: 95 mg/dL (ref 65–99)
Potassium: 4.3 mmol/L (ref 3.5–5.3)
Sodium: 138 mmol/L (ref 135–146)

## 2016-06-27 LAB — HEPATIC FUNCTION PANEL
ALT: 26 U/L (ref 6–29)
AST: 20 U/L (ref 10–35)
Albumin: 4.3 g/dL (ref 3.6–5.1)
Alkaline Phosphatase: 77 U/L (ref 33–130)
Bilirubin, Direct: 0.1 mg/dL (ref <=0.2)
Indirect Bilirubin: 0.4 mg/dL (ref 0.2–1.2)
Total Bilirubin: 0.5 mg/dL (ref 0.2–1.2)
Total Protein: 6.2 g/dL (ref 6.1–8.1)

## 2016-06-27 LAB — TSH: TSH: 1.39 mIU/L

## 2016-06-27 NOTE — Patient Instructions (Addendum)
Add ENTERIC COATED low dose 81 mg Aspirin daily OR can do every other day if you have easy bruising to protect your heart and head. As well as to reduce risk of Colon Cancer by 20 %, Skin Cancer by 26 % , Melanoma by 46% and Pancreatic cancer by 60%  Zetia check it out

## 2016-07-19 ENCOUNTER — Encounter: Payer: Self-pay | Admitting: *Deleted

## 2016-08-14 ENCOUNTER — Other Ambulatory Visit: Payer: Self-pay | Admitting: Physician Assistant

## 2016-08-14 ENCOUNTER — Other Ambulatory Visit: Payer: Self-pay | Admitting: *Deleted

## 2016-08-14 ENCOUNTER — Other Ambulatory Visit: Payer: Self-pay | Admitting: Obstetrics & Gynecology

## 2016-08-14 MED ORDER — AMLODIPINE BESYLATE 2.5 MG PO TABS: 2.5000 mg | ORAL_TABLET | Freq: Every day | ORAL | 0 refills | Status: DC

## 2016-08-14 NOTE — Telephone Encounter (Signed)
Medication refill request: vagifem and aldactone  Last AEX:  08/13/15 SM  Next AEX: 12/11/16 SM  Last MMG (if hormonal medication request): 02/04/16 BIRADS1:neg  Refill authorized: vagifem 12/02/15 #24tabs /3R, Aldactone 08/13/15 #180/4R.  Today please advise.

## 2016-11-04 ENCOUNTER — Other Ambulatory Visit: Payer: Self-pay | Admitting: Internal Medicine

## 2016-11-21 ENCOUNTER — Ambulatory Visit: Payer: 59 | Admitting: Obstetrics & Gynecology

## 2016-11-30 ENCOUNTER — Encounter: Payer: Self-pay | Admitting: Internal Medicine

## 2016-12-11 ENCOUNTER — Ambulatory Visit (INDEPENDENT_AMBULATORY_CARE_PROVIDER_SITE_OTHER): Payer: 59 | Admitting: Obstetrics & Gynecology

## 2016-12-11 ENCOUNTER — Encounter: Payer: Self-pay | Admitting: Obstetrics & Gynecology

## 2016-12-11 VITALS — BP 122/76 | HR 60 | Resp 14 | Ht 66.0 in | Wt 145.0 lb

## 2016-12-11 DIAGNOSIS — Z01419 Encounter for gynecological examination (general) (routine) without abnormal findings: Secondary | ICD-10-CM

## 2016-12-11 MED ORDER — ESTRADIOL 0.1 MG/GM VA CREA: TOPICAL_CREAM | VAGINAL | 2 refills | Status: DC

## 2016-12-11 MED ORDER — ESTRADIOL 10 MCG VA TABS: ORAL_TABLET | VAGINAL | 4 refills | Status: DC

## 2016-12-11 NOTE — Progress Notes (Signed)
66 y.o. [id number] Married Caucasian F here for annual exam.  Doing well.  Denies vaginal bleeding.    PCP:  Dr. Louanne Belton.  Had blood work in April.  Patient's last menstrual period was 12/05/2010.          Sexually active: Yes.    The current method of family planning is post menopausal status.    Exercising: Yes.    Silver Sneakers, walking  Smoker:  no  Health Maintenance: Pap:  08/13/15 negative with HR HPV negative, 05/29/13 negative  History of abnormal Pap:  yes MMG:  02/04/16 BIRADS 1 negative  Colonoscopy:  02/05/15 normal- repeat 10 years  BMD:   01/22/14 normal  TDaP:  11/28/10  Pneumonia vaccine(s):  08/24/04  Zostavax:   01/19/12  Hep C testing: donated platelets in the last year  Screening Labs: PCP, Hb today: PCP   reports that she has never smoked. She has never used smokeless tobacco. She reports that she drinks about 0.6 oz of alcohol per week . She reports that she does not use drugs.  Past Medical History:  Diagnosis Date  . AC (acromioclavicular) joint bone spurs   . Anemia   . Anxiety   . Degenerative arthritis   . Depression   . GERD (gastroesophageal reflux disease)   . Goiter    multi nodular   . History of PCOS   . Hyperlipidemia   . Hypertension   . Nevus of left eye   . PCOS (polycystic ovarian syndrome)     Past Surgical History:  Procedure Laterality Date  . BUNIONECTOMY Bilateral 2006   Feet  . HERNIA REPAIR Right 1959  . TOE SURGERY Left 2006   Bone Spur  . TONSILLECTOMY AND ADENOIDECTOMY  1959  . TUBAL LIGATION  1995    Current Outpatient Prescriptions  Medication Sig Dispense Refill  . amLODipine (NORVASC) 2.5 MG tablet TAKE 1 TABLET BY MOUTH  DAILY 90 tablet 0  . B Complex Vitamins (VITAMIN B COMPLEX PO) Take by mouth.    . Cetirizine HCl (ZYRTEC ALLERGY PO) Take by mouth as needed.     . Cholecalciferol (VITAMIN D PO) Take 5,000 Units by mouth daily.    . COD LIVER OIL PO Take by mouth daily.    . diphenhydrAMINE (BENADRYL) 25 MG  tablet Take 25 mg by mouth every 6 (six) hours as needed.    Marland Kitchen estradiol (ESTRACE) 0.1 MG/GM vaginal cream Apply externally as directed 42.5 g 0  . Estradiol 10 MCG TABS vaginal tablet INSERT 1 TABLET VAGINALLY  TWICE WEEKLY 24 tablet 1  . GLUCOSAMINE-CHONDROITIN PO Take 1,500 Units by mouth daily.     Marland Kitchen MAGNESIUM PO Take 800 Units by mouth daily.     Marland Kitchen MELATONIN PO Take by mouth daily. Reported on 08/13/2015    . Multiple Vitamins-Minerals (HAIR/SKIN/NAILS PO) Take 3 tablets by mouth daily.    . Omega-3 Fatty Acids (FISH OIL PO) Take 1,200 mg by mouth.     . rizatriptan (MAXALT) 10 MG tablet Take by mouth.    . rosuvastatin (CRESTOR) 40 MG tablet TAKE 1 TABLET BY MOUTH  EVERY DAY FOR CHOLESTEROL 90 tablet 0  . spironolactone (ALDACTONE) 100 MG tablet TAKE 2 TABLETS BY MOUTH  DAILY 180 tablet 1  . valACYclovir (VALTREX) 1000 MG tablet Take 1 tablet (1,000 mg total) by mouth daily. 90 tablet 1   No current facility-administered medications for this visit.     Family History  Problem Relation Age of  Onset  . Thyroid disease Mother        hyperthyroidism  . Hypertension Mother   . Cancer Mother   . Hyperlipidemia Mother   . Cancer Father   . Heart disease Brother   . Hyperlipidemia Brother   . Stroke Paternal Grandmother   . Cancer Brother 39       sarcoma  . Colon cancer Neg Hx   . Esophageal cancer Neg Hx   . Rectal cancer Neg Hx   . Stomach cancer Neg Hx     ROS:  Pertinent items are noted in HPI.  Otherwise, a comprehensive ROS was negative.  Exam:   BP 122/76 (BP Location: Left Arm, Patient Position: Sitting, Cuff Size: Normal)   Pulse 60   Resp 14   Ht 5\' 6"  (1.676 m)   Wt 145 lb (65.8 kg)   LMP 12/05/2010   BMI 23.40 kg/m   Weight change: -2#  Height: 5\' 6"  (167.6 cm)  Ht Readings from Last 3 Encounters:  12/11/16 5\' 6"  (1.676 m)  06/27/16 5\' 6"  (1.676 m)  06/06/16 5\' 6"  (1.676 m)    General appearance: alert, cooperative and appears stated age Head:  Normocephalic, without obvious abnormality, atraumatic Neck: no adenopathy, supple, symmetrical, trachea midline and thyroid enlarged, stable Lungs: clear to auscultation bilaterally Breasts: normal appearance, no masses or tenderness Heart: regular rate and rhythm Abdomen: soft, non-tender; bowel sounds normal; no masses,  no organomegaly Extremities: extremities normal, atraumatic, no cyanosis or edema Skin: Skin color, texture, turgor normal. No rashes or lesions Lymph nodes: Cervical, supraclavicular, and axillary nodes normal. No abnormal inguinal nodes palpated Neurologic: Grossly normal   Pelvic: External genitalia:  no lesions              Urethra:  normal appearing urethra with no masses, tenderness or lesions              Bartholins and Skenes: normal                 Vagina: normal appearing vagina with normal color and discharge, no lesions              Cervix: no lesions              Pap taken: No. Bimanual Exam:  Uterus:  normal size, contour, position, consistency, mobility, non-tender              Adnexa: normal adnexa and no mass, fullness, tenderness               Rectovaginal: Confirms               Anus:  normal sphincter tone, no lesions  Chaperone was present for exam.  A:  Well Woman with normal exam H/O multinodular goiter PCOS Hirsutism H/O oral HSV Mildly elevated HbA1C followed by Dr. Melford Aase  P:   Mammogram yearly  pap smear not obtained Last thryoid ultrasound 5/14 Vagifem 73meq pv twice weekly.  #24/4RF.  This was sent to mail order. Paper script to pt for Vaginal estrogen to be used topically prn. Pt planning on getting flue shot, Shingrix vaccination, and Prevnar with PCP  return annually or prn

## 2016-12-26 ENCOUNTER — Encounter: Payer: Self-pay | Admitting: Internal Medicine

## 2017-01-11 ENCOUNTER — Encounter: Payer: Self-pay | Admitting: Physician Assistant

## 2017-01-17 ENCOUNTER — Other Ambulatory Visit: Payer: Self-pay | Admitting: Obstetrics & Gynecology

## 2017-01-17 DIAGNOSIS — Z1231 Encounter for screening mammogram for malignant neoplasm of breast: Secondary | ICD-10-CM

## 2017-02-08 ENCOUNTER — Ambulatory Visit: Payer: 59 | Admitting: Physician Assistant

## 2017-02-08 ENCOUNTER — Encounter: Payer: Self-pay | Admitting: Physician Assistant

## 2017-02-08 VITALS — BP 110/80 | HR 94 | Temp 97.6°F | Resp 14 | Ht 66.0 in | Wt 144.6 lb

## 2017-02-08 DIAGNOSIS — Z0001 Encounter for general adult medical examination with abnormal findings: Secondary | ICD-10-CM

## 2017-02-08 DIAGNOSIS — E559 Vitamin D deficiency, unspecified: Secondary | ICD-10-CM

## 2017-02-08 DIAGNOSIS — Z79899 Other long term (current) drug therapy: Secondary | ICD-10-CM

## 2017-02-08 DIAGNOSIS — Z136 Encounter for screening for cardiovascular disorders: Secondary | ICD-10-CM | POA: Diagnosis not present

## 2017-02-08 DIAGNOSIS — I10 Essential (primary) hypertension: Secondary | ICD-10-CM | POA: Diagnosis not present

## 2017-02-08 DIAGNOSIS — N952 Postmenopausal atrophic vaginitis: Secondary | ICD-10-CM

## 2017-02-08 DIAGNOSIS — G43109 Migraine with aura, not intractable, without status migrainosus: Secondary | ICD-10-CM

## 2017-02-08 DIAGNOSIS — Z0000 Encounter for general adult medical examination without abnormal findings: Secondary | ICD-10-CM | POA: Diagnosis not present

## 2017-02-08 DIAGNOSIS — Z8619 Personal history of other infectious and parasitic diseases: Secondary | ICD-10-CM

## 2017-02-08 DIAGNOSIS — Z6823 Body mass index (BMI) 23.0-23.9, adult: Secondary | ICD-10-CM

## 2017-02-08 DIAGNOSIS — G43909 Migraine, unspecified, not intractable, without status migrainosus: Secondary | ICD-10-CM | POA: Insufficient documentation

## 2017-02-08 DIAGNOSIS — E782 Mixed hyperlipidemia: Secondary | ICD-10-CM

## 2017-02-08 NOTE — Progress Notes (Signed)
Complete Physical  Assessment and Plan:  Essential hypertension - continue medications, DASH diet, exercise and monitor at home. Call if greater than 130/80.  -     CBC with Differential/Platelet -     BASIC METABOLIC PANEL WITH GFR -     Hepatic function panel -     TSH -     Urinalysis, Routine w reflex microscopic -     Microalbumin / creatinine urine ratio -     EKG 12-Lead  BMI 23.0-23.9, adult Monitor/at goal  Mixed hyperlipidemia -continue medications, check lipids, decrease fatty foods, increase activity.  -     Lipid panel  Medication management -     Magnesium  Postmenopausal atrophic vaginitis Continue meds  Vitamin D deficiency -     VITAMIN D 25 Hydroxy (Vit-D Deficiency, Fractures)  History of cold sores Continue valtrex Bump behind ear does not appear to be blister/zoster, put hydrocortisone on it  Migraine with aura and without status migrainosus, not intractable Continue meds PRN, avoid    Discussed med's effects and SE's. Screening labs and tests as requested with regular follow-up as recommended. Future Appointments  Date Time Provider [place]  02/14/2017  8:10 AM [facility] 3 [facility]  12/14/2017  9:15 AM Megan Salon, MD [place] None  02/21/2018 10:00 AM Vicie Mutters, PA-C [facility] None    HPI  66 y.o. female  presents for a complete physical.  She has history of herpes cold sores, has had some blisters x 2 days, and feels bump behind her right ear. She is on valacyclovir now, one a day.   Her blood pressure has been controlled at home, today their BP is BP: 110/80.  She does workout. She denies chest pain, shortness of breath, dizziness.   She is on cholesterol medication and denies myalgias. Her cholesterol is at goal. The cholesterol last visit was:  Lab Results  Component Value Date   CHOL 208 (H) 06/27/2016   HDL 62 06/27/2016   LDLCALC 102 (H) 06/27/2016   TRIG 220 (H) 06/27/2016   CHOLHDL 3.4  06/27/2016    Last A1C in the office was:  Lab Results  Component Value Date   HGBA1C 5.3 12/24/2015   Patient is on Vitamin D supplement.   Lab Results  Component Value Date   VD25OH 40 12/24/2015       BMI is Body mass index is 23.34 kg/m., she is working on diet and exercise. Wt Readings from Last 3 Encounters:  02/08/17 144 lb 9.6 oz (65.6 kg)  12/11/16 145 lb (65.8 kg)  06/27/16 144 lb (65.3 kg)    Current Medications:  Current Outpatient Medications on File Prior to Visit  Medication Sig Dispense Refill  . amLODipine (NORVASC) 2.5 MG tablet TAKE 1 TABLET BY MOUTH  DAILY 90 tablet 0  . aspirin EC 81 MG tablet Take 81 mg by mouth daily.    . B Complex Vitamins (VITAMIN B COMPLEX PO) Take by mouth.    . Cetirizine HCl (ZYRTEC ALLERGY PO) Take by mouth as needed.     . Cholecalciferol (VITAMIN D PO) Take 5,000 Units by mouth daily.    . COD LIVER OIL PO Take by mouth daily.    . Coenzyme Q10 100 MG TABS Take by mouth daily.    . diphenhydrAMINE (BENADRYL) 25 MG tablet Take 25 mg by mouth every 6 (six) hours as needed.    Marland Kitchen estradiol (ESTRACE) 0.1 MG/GM vaginal cream Apply externally as  directed 42.5 g 2  . Estradiol 10 MCG TABS vaginal tablet INSERT 1 TABLET VAGINALLY  TWICE WEEKLY 24 tablet 4  . GLUCOSAMINE-CHONDROITIN PO Take 1,500 Units by mouth daily.     Marland Kitchen MAGNESIUM PO Take 800 Units by mouth daily.     Marland Kitchen MELATONIN PO Take by mouth daily. Reported on 08/13/2015    . Multiple Vitamins-Minerals (HAIR/SKIN/NAILS PO) Take 3 tablets by mouth daily.    . Omega-3 Fatty Acids (FISH OIL PO) Take 1,200 mg by mouth.     . Probiotic Product (PROBIOTIC DAILY PO) Take by mouth daily.    . rizatriptan (MAXALT) 10 MG tablet Take by mouth.    . rosuvastatin (CRESTOR) 40 MG tablet TAKE 1 TABLET BY MOUTH  EVERY DAY FOR CHOLESTEROL 90 tablet 0  . spironolactone (ALDACTONE) 100 MG tablet TAKE 2 TABLETS BY MOUTH  DAILY 180 tablet 1  . valACYclovir (VALTREX) 1000 MG tablet Take 1 tablet  (1,000 mg total) by mouth daily. 90 tablet 1   No current facility-administered medications on file prior to visit.     Health Maintenance:   Immunization History  Administered Date(s) Administered  . Influenza Split 12/23/2013, 12/24/2014  . Influenza Whole 12/16/2012  . Influenza,inj,quad, With Preservative 12/24/2015  . Influenza-Unspecified 12/20/2016  . Pneumococcal Conjugate-13 12/20/2016  . Pneumococcal Polysaccharide-23 08/24/2004  . Tdap 11/28/2010  . Zoster 01/19/2012  . Zoster Recombinat (Shingrix) 12/27/2016    Tetanus:  2012 Pneumovax: 2006 Prevnar 13: 2018 Flu vaccine: 2018 Zostavax: 2013 Shingrix: 2018  MGM: 02/04/2016 scheduled next week DEXA: 01/21/14 Colonoscopy: 2016 Stress test 2006 Opthalmology:  Dr. Harle Stanford, and Dr. Annamaria Boots  Patient Care Team: Unk Pinto, MD as PCP - General (Internal Medicine) Megan Salon, MD as Consulting Physician (Gynecology) Carlena Bjornstad, MD as Consulting Physician (Cardiology) Inda Castle, MD (Inactive) as Consulting Physician (Gastroenterology) Monna Fam, MD as Consulting Physician (Ophthalmology) Gerarda Fraction, MD as Referring Physician (Ophthalmology) Glenna Fellows, MD as Attending Physician (Neurosurgery) [place], Jennefer Bravo, MD as Referring Physician (Dermatology) Marybelle Killings, MD as Consulting Physician (Orthopedic Surgery)  Allergies:  Allergies  Allergen Reactions  . Bactrim [Sulfamethoxazole-Trimethoprim] Itching  . Lipitor [Atorvastatin] Other (See Comments)    headache  . Monistat [Miconazole] Itching    burning    Medical History:  Past Medical History:  Diagnosis Date  . AC (acromioclavicular) joint bone spurs   . Anemia   . Anxiety   . Degenerative arthritis   . Depression   . GERD (gastroesophageal reflux disease)   . Goiter    multi nodular   . History of PCOS   . Hyperlipidemia   . Hypertension   . Nevus of left eye   . Nuclear cataract 06/13/2011  . PCOS  (polycystic ovarian syndrome)   . Status post intraocular lens implant 06/13/2011   Allergies Allergies  Allergen Reactions  . Bactrim [Sulfamethoxazole-Trimethoprim] Itching  . Lipitor [Atorvastatin] Other (See Comments)    headache  . Monistat [Miconazole] Itching    burning    SURGICAL HISTORY She  has a past surgical history that includes Tubal ligation (1995); Tonsillectomy and adenoidectomy (1959); Bunionectomy (Bilateral, 2006); Toe Surgery (Left, 2006); and Hernia repair (Right, 1959). FAMILY HISTORY Her family history includes Cancer in her father and mother; Cancer (age of onset: 41) in her brother; Heart disease in her brother; Hyperlipidemia in her brother and mother; Hypertension in her mother; Stroke in her paternal grandmother; Thyroid disease in her mother. SOCIAL HISTORY She  reports  that  has never smoked. she has never used smokeless tobacco. She reports that she drinks about 0.6 oz of alcohol per week. She reports that she does not use drugs.  Review of Systems: Review of Systems  Constitutional: Negative for chills, fever and malaise/fatigue.  HENT: Negative for congestion, ear pain and sore throat.   Eyes: Negative.   Respiratory: Negative for cough, shortness of breath and wheezing.   Cardiovascular: Negative for chest pain, palpitations and leg swelling.  Gastrointestinal: Negative for abdominal pain, blood in stool, constipation, diarrhea, heartburn and melena.  Genitourinary: Negative.   Skin: Negative.   Neurological: Negative for dizziness, sensory change, loss of consciousness and headaches.  Psychiatric/Behavioral: Negative for depression. The patient is not nervous/anxious and does not have insomnia.     Physical Exam: Estimated body mass index is 23.34 kg/m as calculated from the following:   Height as of this encounter: 5\' 6"  (1.676 m).   Weight as of this encounter: 144 lb 9.6 oz (65.6 kg). BP 110/80   Pulse 94   Temp 97.6 F (36.4 C)   Resp  14   Ht 5\' 6"  (1.676 m)   Wt 144 lb 9.6 oz (65.6 kg)   LMP 12/05/2010   SpO2 98%   BMI 23.34 kg/m     Wt Readings from Last 3 Encounters:  02/08/17 144 lb 9.6 oz (65.6 kg)  12/11/16 145 lb (65.8 kg)  06/27/16 144 lb (65.3 kg)    General Appearance: Well nourished well developed, in no apparent distress.  Eyes: PERRLA, EOMs, conjunctiva no swelling or erythema ENT/Mouth: Ear canals normal without obstruction, swelling, erythema, or discharge.  TMs normal bilaterally with no erythema, bulging, retraction, or loss of landmark.  Oropharynx moist and clear with no exudate, erythema, or swelling.   Neck: Supple, thyroid normal. No bruits.  No cervical adenopathy Respiratory: Respiratory effort normal, Breath sounds clear A&P without wheeze, rhonchi, rales.   Cardio: RRR without murmurs, rubs or gallops. Brisk peripheral pulses without edema.  Chest: symmetric, with normal excursions Breasts: Symmetric, without lumps, nipple discharge, retractions.  Abdomen: Soft, nontender, no guarding, rebound, hernias, masses, or organomegaly.  Lymphatics: Non tender without lymphadenopathy.  Genitourinary: Deferred to Gyn for yearly checkups Musculoskeletal: Full ROM all peripheral extremities,5/5 strength, and normal gait.  Skin: Warm, dry without rashes, lesions, ecchymosis. Neuro: Awake and oriented X 3, Cranial nerves intact, reflexes equal bilaterally. Normal muscle tone, no cerebellar symptoms. Sensation intact.  Psych:  normal affect, Insight and Judgment appropriate.   EKG: WNL no changes.  Over 40 minutes of exam, counseling, chart review and critical decision making was performed  Vicie Mutters 11:36 AM [facility]

## 2017-02-09 LAB — HEPATIC FUNCTION PANEL
AG Ratio: 2.3 (calc) (ref 1.0–2.5)
ALT: 26 U/L (ref 6–29)
AST: 21 U/L (ref 10–35)
Albumin: 4.9 g/dL (ref 3.6–5.1)
Alkaline phosphatase (APISO): 101 U/L (ref 33–130)
Bilirubin, Direct: 0.1 mg/dL (ref 0.0–0.2)
Globulin: 2.1 g/dL (calc) (ref 1.9–3.7)
Indirect Bilirubin: 0.5 mg/dL (calc) (ref 0.2–1.2)
Total Bilirubin: 0.6 mg/dL (ref 0.2–1.2)
Total Protein: 7 g/dL (ref 6.1–8.1)

## 2017-02-09 LAB — CBC WITH DIFFERENTIAL/PLATELET
Basophils Absolute: 61 cells/uL (ref 0–200)
Basophils Relative: 1.1 %
Eosinophils Absolute: 99 cells/uL (ref 15–500)
Eosinophils Relative: 1.8 %
HCT: 43.7 % (ref 35.0–45.0)
Hemoglobin: 14.7 g/dL (ref 11.7–15.5)
Lymphs Abs: 1430 cells/uL (ref 850–3900)
MCH: 30.2 pg (ref 27.0–33.0)
MCHC: 33.6 g/dL (ref 32.0–36.0)
MCV: 89.9 fL (ref 80.0–100.0)
MPV: 9.6 fL (ref 7.5–12.5)
Monocytes Relative: 7.7 %
Neutro Abs: 3487 cells/uL (ref 1500–7800)
Neutrophils Relative %: 63.4 %
Platelets: 298 10*3/uL (ref 140–400)
RBC: 4.86 10*6/uL (ref 3.80–5.10)
RDW: 11.8 % (ref 11.0–15.0)
Total Lymphocyte: 26 %
WBC mixed population: 424 cells/uL (ref 200–950)
WBC: 5.5 10*3/uL (ref 3.8–10.8)

## 2017-02-09 LAB — URINALYSIS, ROUTINE W REFLEX MICROSCOPIC
Bilirubin Urine: NEGATIVE
Glucose, UA: NEGATIVE
Hgb urine dipstick: NEGATIVE
Ketones, ur: NEGATIVE
Leukocytes, UA: NEGATIVE
Nitrite: NEGATIVE
Protein, ur: NEGATIVE
Specific Gravity, Urine: 1.025 (ref 1.001–1.03)
pH: 5.5 (ref 5.0–8.0)

## 2017-02-09 LAB — BASIC METABOLIC PANEL WITH GFR
BUN: 18 mg/dL (ref 7–25)
CO2: 28 mmol/L (ref 20–32)
Calcium: 10.2 mg/dL (ref 8.6–10.4)
Chloride: 101 mmol/L (ref 98–110)
Creat: 0.81 mg/dL (ref 0.50–0.99)
GFR, Est African American: 88 mL/min/{1.73_m2} (ref >=60)
GFR, Est Non African American: 76 mL/min/{1.73_m2} (ref >=60)
Glucose, Bld: 96 mg/dL (ref 65–99)
Potassium: 4.9 mmol/L (ref 3.5–5.3)
Sodium: 138 mmol/L (ref 135–146)

## 2017-02-09 LAB — LIPID PANEL
Cholesterol: 212 mg/dL — ABNORMAL HIGH (ref <200)
HDL: 71 mg/dL (ref >50)
LDL Cholesterol (Calc): 116 mg/dL (calc) — ABNORMAL HIGH
Non-HDL Cholesterol (Calc): 141 mg/dL (calc) — ABNORMAL HIGH (ref <130)
Total CHOL/HDL Ratio: 3 (calc) (ref <5.0)
Triglycerides: 131 mg/dL (ref <150)

## 2017-02-09 LAB — MICROALBUMIN / CREATININE URINE RATIO
Creatinine, Urine: 176 mg/dL (ref 20–275)
Microalb Creat Ratio: 3 mcg/mg creat (ref <30)
Microalb, Ur: 0.5 mg/dL

## 2017-02-09 LAB — VITAMIN D 25 HYDROXY (VIT D DEFICIENCY, FRACTURES): Vit D, 25-Hydroxy: 47 ng/mL (ref 30–100)

## 2017-02-09 LAB — MAGNESIUM: Magnesium: 2 mg/dL (ref 1.5–2.5)

## 2017-02-09 LAB — TSH: TSH: 1.87 mIU/L (ref 0.40–4.50)

## 2017-02-14 ENCOUNTER — Ambulatory Visit: Payer: 59

## 2017-03-16 ENCOUNTER — Ambulatory Visit
Admission: RE | Admit: 2017-03-16 | Discharge: 2017-03-16 | Disposition: A | Discharge status: Home / Self Care | Payer: 59 | Source: Ambulatory Visit | Attending: Obstetrics & Gynecology | Admitting: Obstetrics & Gynecology

## 2017-03-16 DIAGNOSIS — Z1231 Encounter for screening mammogram for malignant neoplasm of breast: Secondary | ICD-10-CM

## 2017-04-04 ENCOUNTER — Other Ambulatory Visit: Payer: Self-pay | Admitting: Internal Medicine

## 2017-06-11 ENCOUNTER — Encounter: Payer: Self-pay | Admitting: Physician Assistant

## 2017-06-11 ENCOUNTER — Ambulatory Visit: Payer: 59 | Admitting: Physician Assistant

## 2017-06-11 VITALS — BP 118/76 | HR 66 | Temp 97.5°F | Resp 14 | Ht 66.0 in | Wt 145.2 lb

## 2017-06-11 DIAGNOSIS — G43109 Migraine with aura, not intractable, without status migrainosus: Secondary | ICD-10-CM

## 2017-06-11 DIAGNOSIS — Z8619 Personal history of other infectious and parasitic diseases: Secondary | ICD-10-CM

## 2017-06-11 DIAGNOSIS — I10 Essential (primary) hypertension: Secondary | ICD-10-CM

## 2017-06-11 DIAGNOSIS — E782 Mixed hyperlipidemia: Secondary | ICD-10-CM

## 2017-06-11 DIAGNOSIS — Z79899 Other long term (current) drug therapy: Secondary | ICD-10-CM | POA: Diagnosis not present

## 2017-06-11 DIAGNOSIS — E559 Vitamin D deficiency, unspecified: Secondary | ICD-10-CM | POA: Diagnosis not present

## 2017-06-11 DIAGNOSIS — N952 Postmenopausal atrophic vaginitis: Secondary | ICD-10-CM

## 2017-06-11 DIAGNOSIS — E041 Nontoxic single thyroid nodule: Secondary | ICD-10-CM

## 2017-06-11 MED ORDER — VALACYCLOVIR HCL 1 G PO TABS: 1000.0000 mg | ORAL_TABLET | Freq: Every day | ORAL | 1 refills | Status: DC

## 2017-06-11 NOTE — Patient Instructions (Addendum)
can take with aleve, or ibuprofen Try not to put pressure on your tail bone Can do heat or ice You can take tylenol (500mg ) or tylenol arthritis (650mg ) with the meloxicam/antiinflammatories. The max you can take of tylenol a day is 3000mg  daily, this is a max of 6 pills a day of the regular tyelnol (500mg ) or a max of 4 a day of the tylenol arthritis (650mg ) as long as no other medications you are taking contain tylenol.   I have put in an order for an ultrasound for you to have You can set them up at your convenience by calling this number [phone] You will likely have the ultrasound at [address]  If you have any issues call our office and we will set this up for you.

## 2017-06-11 NOTE — Progress Notes (Signed)
Assessment and Plan:    Essential hypertension -     CBC with Differential/Platelet -     BASIC METABOLIC PANEL WITH GFR -     Hepatic function panel -     TSH  Mixed hyperlipidemia -     Lipid panel  Thyroid nodule -     US THYROID; Future  Other orders -     valACYclovir (VALTREX) 1000 MG tablet; Take 1 tablet (1,000 mg total) by mouth daily.   Continue diet and meds as discussed. Further disposition pending results of labs. Future Appointments  Date Time Provider [place]  10/29/2017 10:30 AM Vicie Mutters, PA-C [facility] None  12/14/2017  9:15 AM Megan Salon, MD [place] None  02/21/2018 10:00 AM Vicie Mutters, PA-C [facility] None    HPI 67 y.o. female  presents for 3 month follow up with hypertension, hyperlipidemia, prediabetes and vitamin D.  At the end of the year will be going on medicare when husband will retire at the end of the year, has history of thyroid nodules and was suppose to be checked.    Her blood pressure has been controlled at home, today their BP is BP: 118/76.   She does workout. She denies chest pain, shortness of breath, dizziness.   She is on cholesterol medication and denies myalgias, when she was on a higher dose of crestor she had a grade 2 muscle tear, she is on 20mg  M,W,F. Her cholesterol is at goal. The cholesterol last visit was:   Lab Results  Component Value Date   CHOL 212 (H) 02/08/2017   HDL 71 02/08/2017   LDLCALC 116 (H) 02/08/2017   TRIG 131 02/08/2017   CHOLHDL 3.0 02/08/2017    She has been working on diet and exercise for prediabetes, and denies foot ulcerations, hyperglycemia, hypoglycemia , increased appetite, nausea, paresthesia of the feet, polydipsia, polyuria, visual disturbances, vomiting and weight loss. Last A1C in the office was:  Lab Results  Component Value Date   HGBA1C 5.3 12/24/2015   Patient is on Vitamin D supplement.  Lab Results  Component Value Date   VD25OH 47 02/08/2017     BMI is  Body mass index is 23.44 kg/m., she is working on diet and exercise. Wt Readings from Last 3 Encounters:  06/11/17 145 lb 3.2 oz (65.9 kg)  02/08/17 144 lb 9.6 oz (65.6 kg)  12/11/16 145 lb (65.8 kg)     Current Medications:  Current Outpatient Medications on File Prior to Visit  Medication Sig Dispense Refill  . amLODipine (NORVASC) 2.5 MG tablet TAKE 1 TABLET BY MOUTH  DAILY 90 tablet 0  . aspirin EC 81 MG tablet Take 81 mg by mouth daily.    . B Complex Vitamins (VITAMIN B COMPLEX PO) Take by mouth.    . Cetirizine HCl (ZYRTEC ALLERGY PO) Take by mouth as needed.     . Cholecalciferol (VITAMIN D PO) Take 5,000 Units by mouth daily.    . COD LIVER OIL PO Take by mouth daily.    . Coenzyme Q10 100 MG TABS Take by mouth daily.    . diphenhydrAMINE (BENADRYL) 25 MG tablet Take 25 mg by mouth every 6 (six) hours as needed.    Marland Kitchen estradiol (ESTRACE) 0.1 MG/GM vaginal cream Apply externally as directed 42.5 g 2  . Estradiol 10 MCG TABS vaginal tablet INSERT 1 TABLET VAGINALLY  TWICE WEEKLY 24 tablet 4  . GLUCOSAMINE-CHONDROITIN PO Take 1,500 Units by mouth daily.     Marland Kitchen  MAGNESIUM PO Take 800 Units by mouth daily.     Marland Kitchen MELATONIN PO Take by mouth daily. Reported on 08/13/2015    . Multiple Vitamins-Minerals (HAIR/SKIN/NAILS PO) Take 3 tablets by mouth daily.    . Omega-3 Fatty Acids (FISH OIL PO) Take 1,200 mg by mouth.     . Probiotic Product (PROBIOTIC DAILY PO) Take by mouth daily.    . rizatriptan (MAXALT) 10 MG tablet Take by mouth.    . rosuvastatin (CRESTOR) 40 MG tablet TAKE 1 TABLET BY MOUTH  EVERY DAY FOR CHOLESTEROL 90 tablet 0  . spironolactone (ALDACTONE) 100 MG tablet TAKE 2 TABLETS BY MOUTH  DAILY 180 tablet 1  . valACYclovir (VALTREX) 1000 MG tablet Take 1 tablet (1,000 mg total) by mouth daily. 90 tablet 1   No current facility-administered medications on file prior to visit.     Medical History:  Past Medical History:  Diagnosis Date  . AC (acromioclavicular) joint  bone spurs   . Anemia   . Anxiety   . Degenerative arthritis   . Depression   . GERD (gastroesophageal reflux disease)   . Goiter    multi nodular   . History of PCOS   . Hyperlipidemia   . Hypertension   . Nevus of left eye   . Nuclear cataract 06/13/2011  . PCOS (polycystic ovarian syndrome)   . Status post intraocular lens implant 06/13/2011    Allergies:  Allergies  Allergen Reactions  . Bactrim [Sulfamethoxazole-Trimethoprim] Itching  . Lipitor [Atorvastatin] Other (See Comments)    headache  . Monistat [Miconazole] Itching    burning     Review of Systems:  Review of Systems  Constitutional: Negative for chills, fever and malaise/fatigue.  HENT: Negative for congestion, ear pain and sore throat.   Eyes: Negative.   Respiratory: Negative for cough, shortness of breath and wheezing.   Cardiovascular: Negative for chest pain, palpitations and leg swelling.  Gastrointestinal: Negative for abdominal pain, blood in stool, constipation, diarrhea, heartburn and melena.  Genitourinary: Negative.   Skin: Negative.   Neurological: Negative for dizziness, sensory change, loss of consciousness and headaches.  Psychiatric/Behavioral: Negative for depression. The patient is not nervous/anxious and does not have insomnia.     Family history- Review and unchanged  Social history- Review and unchanged  Physical Exam: BP 118/76   Pulse 66   Temp (!) 97.5 F (36.4 C)   Resp 14   Ht 5\' 6"  (1.676 m)   Wt 145 lb 3.2 oz (65.9 kg)   LMP 12/05/2010   SpO2 96%   BMI 23.44 kg/m  Wt Readings from Last 3 Encounters:  06/11/17 145 lb 3.2 oz (65.9 kg)  02/08/17 144 lb 9.6 oz (65.6 kg)  12/11/16 145 lb (65.8 kg)    General Appearance: Well nourished well developed, in no apparent distress. Eyes: PERRLA, EOMs, conjunctiva no swelling or erythema ENT/Mouth: Ear canals normal without obstruction, swelling, erythma, discharge.  TMs normal bilaterally.  Oropharynx moist, clear, without  exudate, or postoropharyngeal swelling. Neck: Supple, thyroid normal,no cervical adenopathy  Respiratory: Respiratory effort normal, Breath sounds clear A&P without rhonchi, wheeze, or rale.  No retractions, no accessory usage. Cardio: RRR with no MRGs. Brisk peripheral pulses without edema.  Abdomen: Soft, + BS,  Non tender, no guarding, rebound, hernias, masses. Musculoskeletal: Full ROM, 5/5 strength, Normal gait Skin: Warm, dry without rashes, lesions, ecchymosis.  Neuro: Awake and oriented X 3, Cranial nerves intact. Normal muscle tone, no cerebellar symptoms. Psych: Normal affect,  Insight and Judgment appropriate.    Vicie Mutters, PA-C 10:53 AM [facility]

## 2017-06-12 LAB — CBC WITH DIFFERENTIAL/PLATELET
Basophils Absolute: 52 cells/uL (ref 0–200)
Basophils Relative: 1 %
Eosinophils Absolute: 120 cells/uL (ref 15–500)
Eosinophils Relative: 2.3 %
HCT: 43 % (ref 35.0–45.0)
Hemoglobin: 14.7 g/dL (ref 11.7–15.5)
Lymphs Abs: 1217 cells/uL (ref 850–3900)
MCH: 30.3 pg (ref 27.0–33.0)
MCHC: 34.2 g/dL (ref 32.0–36.0)
MCV: 88.7 fL (ref 80.0–100.0)
MPV: 9.7 fL (ref 7.5–12.5)
Monocytes Relative: 7.6 %
Neutro Abs: 3416 cells/uL (ref 1500–7800)
Neutrophils Relative %: 65.7 %
Platelets: 277 10*3/uL (ref 140–400)
RBC: 4.85 10*6/uL (ref 3.80–5.10)
RDW: 11.8 % (ref 11.0–15.0)
Total Lymphocyte: 23.4 %
WBC mixed population: 395 cells/uL (ref 200–950)
WBC: 5.2 10*3/uL (ref 3.8–10.8)

## 2017-06-12 LAB — BASIC METABOLIC PANEL WITH GFR
BUN: 18 mg/dL (ref 7–25)
CO2: 31 mmol/L (ref 20–32)
Calcium: 10.7 mg/dL — ABNORMAL HIGH (ref 8.6–10.4)
Chloride: 103 mmol/L (ref 98–110)
Creat: 0.94 mg/dL (ref 0.50–0.99)
GFR, Est African American: 74 mL/min/{1.73_m2} (ref >=60)
GFR, Est Non African American: 64 mL/min/{1.73_m2} (ref >=60)
Glucose, Bld: 105 mg/dL — ABNORMAL HIGH (ref 65–99)
Potassium: 5.8 mmol/L — ABNORMAL HIGH (ref 3.5–5.3)
Sodium: 141 mmol/L (ref 135–146)

## 2017-06-12 LAB — LIPID PANEL
Cholesterol: 194 mg/dL (ref <200)
HDL: 62 mg/dL (ref >50)
LDL Cholesterol (Calc): 108 mg/dL (calc) — ABNORMAL HIGH
Non-HDL Cholesterol (Calc): 132 mg/dL (calc) — ABNORMAL HIGH (ref <130)
Total CHOL/HDL Ratio: 3.1 (calc) (ref <5.0)
Triglycerides: 126 mg/dL (ref <150)

## 2017-06-12 LAB — HEPATIC FUNCTION PANEL
AG Ratio: 2.8 (calc) — ABNORMAL HIGH (ref 1.0–2.5)
ALT: 19 U/L (ref 6–29)
AST: 17 U/L (ref 10–35)
Albumin: 5 g/dL (ref 3.6–5.1)
Alkaline phosphatase (APISO): 96 U/L (ref 33–130)
Bilirubin, Direct: 0.1 mg/dL (ref 0.0–0.2)
Globulin: 1.8 g/dL (calc) — ABNORMAL LOW (ref 1.9–3.7)
Indirect Bilirubin: 0.5 mg/dL (calc) (ref 0.2–1.2)
Total Bilirubin: 0.6 mg/dL (ref 0.2–1.2)
Total Protein: 6.8 g/dL (ref 6.1–8.1)

## 2017-06-12 LAB — TSH: TSH: 1.73 mIU/L (ref 0.40–4.50)

## 2017-06-20 ENCOUNTER — Ambulatory Visit
Admission: RE | Admit: 2017-06-20 | Discharge: 2017-06-20 | Disposition: A | Discharge status: Home / Self Care | Payer: 59 | Source: Ambulatory Visit | Attending: Physician Assistant | Admitting: Physician Assistant

## 2017-06-20 DIAGNOSIS — E041 Nontoxic single thyroid nodule: Secondary | ICD-10-CM

## 2017-07-28 ENCOUNTER — Other Ambulatory Visit: Payer: Self-pay | Admitting: Internal Medicine

## 2017-10-24 NOTE — Progress Notes (Signed)
Assessment and Plan:   Essential hypertension -     CBC with Differential/Platelet -     COMPLETE METABOLIC PANEL WITH GFR -     TSH  Mixed hyperlipidemia -     Lipid panel  Medication management -     Magnesium     Continue diet and meds as discussed. Further disposition pending results of labs. Future Appointments  Date Time Provider [place]  02/08/2018 10:00 AM Megan Salon, MD [place] None  02/21/2018 10:00 AM Vicie Mutters, PA-C [facility] None    HPI 67 y.o. female  presents for 3 month follow up with hypertension, hyperlipidemia, prediabetes and vitamin D.   Her blood pressure has been controlled at home, today their BP is BP: 124/68.   She does workout. She denies chest pain, shortness of breath, dizziness.  For several months she had frequent lose stools with some urgency but she has switched her magnesium and states it is doing better. Last colonoscopy was 2016. No fever, chills, no dark black stool/blood in stool, no AB pain. No recent ABX use, no travel.     She is on cholesterol medication and denies myalgias, when she was on a higher dose of crestor she had a grade 2 muscle tear, she is on 20mg  M,W,F. Her cholesterol is at goal. The cholesterol last visit was:   Lab Results  Component Value Date   CHOL 194 06/11/2017   HDL 62 06/11/2017   LDLCALC 108 (H) 06/11/2017   TRIG 126 06/11/2017   CHOLHDL 3.1 06/11/2017    She has been working on diet and exercise for prediabetes, and denies foot ulcerations, hyperglycemia, hypoglycemia , increased appetite, nausea, paresthesia of the feet, polydipsia, polyuria, visual disturbances, vomiting and weight loss. Last A1C in the office was:  Lab Results  Component Value Date   HGBA1C 5.3 12/24/2015   Patient is on Vitamin D supplement.  Lab Results  Component Value Date   VD25OH 47 02/08/2017     BMI is Body mass index is 22.79 kg/m., she is working on diet and exercise. Wt Readings from Last 3  Encounters:  10/29/17 141 lb 3.2 oz (64 kg)  06/11/17 145 lb 3.2 oz (65.9 kg)  02/08/17 144 lb 9.6 oz (65.6 kg)     Current Medications:  Current Outpatient Medications on File Prior to Visit  Medication Sig Dispense Refill  . amLODipine (NORVASC) 2.5 MG tablet TAKE 1 TABLET BY MOUTH  DAILY 90 tablet 0  . aspirin EC 81 MG tablet Take 81 mg by mouth daily.    . B Complex Vitamins (VITAMIN B COMPLEX PO) Take by mouth.    . Cetirizine HCl (ZYRTEC ALLERGY PO) Take by mouth as needed.     . Cholecalciferol (VITAMIN D PO) Take 5,000 Units by mouth daily.    . COD LIVER OIL PO Take by mouth daily.    . Coenzyme Q10 100 MG TABS Take by mouth daily.    . diphenhydrAMINE (BENADRYL) 25 MG tablet Take 25 mg by mouth every 6 (six) hours as needed.    Marland Kitchen estradiol (ESTRACE) 0.1 MG/GM vaginal cream Apply externally as directed 42.5 g 2  . Estradiol 10 MCG TABS vaginal tablet INSERT 1 TABLET VAGINALLY  TWICE WEEKLY 24 tablet 4  . GLUCOSAMINE-CHONDROITIN PO Take 1,500 Units by mouth daily.     Marland Kitchen MAGNESIUM PO Take 800 Units by mouth daily.     Marland Kitchen MELATONIN PO Take by mouth daily. Reported on 08/13/2015    .  Multiple Vitamins-Minerals (HAIR/SKIN/NAILS PO) Take 3 tablets by mouth daily.    . Omega-3 Fatty Acids (FISH OIL PO) Take 1,200 mg by mouth.     . Probiotic Product (PROBIOTIC DAILY PO) Take by mouth daily.    . rizatriptan (MAXALT) 10 MG tablet Take by mouth.    . rosuvastatin (CRESTOR) 40 MG tablet TAKE 1 TABLET BY MOUTH  EVERY DAY FOR CHOLESTEROL 90 tablet 0  . spironolactone (ALDACTONE) 100 MG tablet TAKE 2 TABLETS BY MOUTH  DAILY 180 tablet 1  . valACYclovir (VALTREX) 1000 MG tablet Take 1 tablet (1,000 mg total) by mouth daily. 90 tablet 1   No current facility-administered medications on file prior to visit.     Medical History:  Past Medical History:  Diagnosis Date  . AC (acromioclavicular) joint bone spurs   . Anemia   . Anxiety   . Degenerative arthritis   . Depression   . GERD  (gastroesophageal reflux disease)   . Goiter    multi nodular   . History of PCOS   . Hyperlipidemia   . Hypertension   . Nevus of left eye   . Nuclear cataract 06/13/2011  . PCOS (polycystic ovarian syndrome)   . Status post intraocular lens implant 06/13/2011    Allergies:  Allergies  Allergen Reactions  . Bactrim [Sulfamethoxazole-Trimethoprim] Itching  . Lipitor [Atorvastatin] Other (See Comments)    headache  . Monistat [Miconazole] Itching    burning     Review of Systems:  Review of Systems  Constitutional: Negative for chills, fever and malaise/fatigue.  HENT: Negative for congestion, ear pain and sore throat.   Eyes: Negative.   Respiratory: Negative for cough, shortness of breath and wheezing.   Cardiovascular: Negative for chest pain, palpitations and leg swelling.  Gastrointestinal: Negative for abdominal pain, blood in stool, constipation, diarrhea, heartburn and melena.  Genitourinary: Negative.   Skin: Negative.   Neurological: Negative for dizziness, sensory change, loss of consciousness and headaches.  Psychiatric/Behavioral: Negative for depression. The patient is not nervous/anxious and does not have insomnia.     Family history- Review and unchanged  Social history- Review and unchanged  Physical Exam: BP 124/68   Temp 98.1 F (36.7 C)   Resp 14   Ht 5\' 6"  (1.676 m)   Wt 141 lb 3.2 oz (64 kg)   LMP 12/05/2010   BMI 22.79 kg/m  Wt Readings from Last 3 Encounters:  10/29/17 141 lb 3.2 oz (64 kg)  06/11/17 145 lb 3.2 oz (65.9 kg)  02/08/17 144 lb 9.6 oz (65.6 kg)    General Appearance: Well nourished well developed, in no apparent distress. Eyes: PERRLA, EOMs, conjunctiva no swelling or erythema ENT/Mouth: Ear canals normal without obstruction, swelling, erythma, discharge.  TMs normal bilaterally.  Oropharynx moist, clear, without exudate, or postoropharyngeal swelling. Neck: Supple, thyroid normal,no cervical adenopathy  Respiratory:  Respiratory effort normal, Breath sounds clear A&P without rhonchi, wheeze, or rale.  No retractions, no accessory usage. Cardio: RRR with no MRGs. Brisk peripheral pulses without edema.  Abdomen: Soft, + BS,  Non tender, no guarding, rebound, hernias, masses. Musculoskeletal: Full ROM, 5/5 strength, Normal gait Skin: Warm, dry without rashes, lesions, ecchymosis.  Neuro: Awake and oriented X 3, Cranial nerves intact. Normal muscle tone, no cerebellar symptoms. Psych: Normal affect, Insight and Judgment appropriate.    Vicie Mutters, PA-C 10:43 AM [facility]

## 2017-10-29 ENCOUNTER — Ambulatory Visit (INDEPENDENT_AMBULATORY_CARE_PROVIDER_SITE_OTHER): Payer: 59 | Admitting: Physician Assistant

## 2017-10-29 ENCOUNTER — Encounter: Payer: Self-pay | Admitting: Physician Assistant

## 2017-10-29 VITALS — BP 124/68 | Temp 98.1°F | Resp 14 | Ht 66.0 in | Wt 141.2 lb

## 2017-10-29 DIAGNOSIS — Z79899 Other long term (current) drug therapy: Secondary | ICD-10-CM | POA: Diagnosis not present

## 2017-10-29 DIAGNOSIS — E782 Mixed hyperlipidemia: Secondary | ICD-10-CM

## 2017-10-29 DIAGNOSIS — I10 Essential (primary) hypertension: Secondary | ICD-10-CM | POA: Diagnosis not present

## 2017-10-29 NOTE — Patient Instructions (Addendum)
Ways to prevent diarrhea with magnesium:  1) Don't take all your magnesium at the same time, have 2-3 smaller doses through out the day 2) Try taking your magnesium with high fiber meals.  3) If this does not help, take the magnesium on an empty stomach. Fiber for some people can bind the magnesium too well and prevent absorption in your gut.  4) Lastly try different types of magnesium. Most people are taking magnesium citrate, you can also try dimalate capsules which are slow release. You can also find magnesium lotions/sprays for the skin that bypass the gut. Another one that has good absorption is ReMag (pico-iconic magnesium formula), this has great cellular absorption so less of a laxative effect. You can find these type at health food stores or online.   Your ears and sinuses are connected by the eustachian tube. When your sinuses are inflamed, this can close off the tube and cause fluid to collect in your middle ear. This can then cause dizziness, popping, clicking, ringing, and echoing in your ears. This is often NOT an infection and does NOT require antibiotics, it is caused by inflammation so the treatments help the inflammation. This can take a long time to get better so please be patient.  Here are things you can do to help with this: - Try the Flonase or Nasonex. Remember to spray each nostril twice towards the outer part of your eye.  Do not sniff but instead pinch your nose and tilt your head back to help the medicine get into your sinuses.  The best time to do this is at bedtime.Stop if you get blurred vision or nose bleeds.  -While drinking fluids, pinch and hold nose close and swallow, to help open eustachian tubes to drain fluid behind ear drums. -Please pick one of the over the counter allergy medications below and take it once daily for allergies.  It will also help with fluid behind ear drums. Claritin or loratadine cheapest but likely the weakest  Zyrtec or certizine at night  because it can make you sleepy The strongest is allegra or fexafinadine  Cheapest at [facility], [employer], [employer] -can use decongestant over the counter, please do not use if you have high blood pressure or certain heart conditions.   if worsening HA, changes vision/speech, imbalance, weakness go to the ER

## 2017-10-30 LAB — CBC WITH DIFFERENTIAL/PLATELET
Basophils Absolute: 40 cells/uL (ref 0–200)
Basophils Relative: 0.8 %
Eosinophils Absolute: 120 cells/uL (ref 15–500)
Eosinophils Relative: 2.4 %
HCT: 43.6 % (ref 35.0–45.0)
Hemoglobin: 14.6 g/dL (ref 11.7–15.5)
Lymphs Abs: 1285 cells/uL (ref 850–3900)
MCH: 29.4 pg (ref 27.0–33.0)
MCHC: 33.5 g/dL (ref 32.0–36.0)
MCV: 87.9 fL (ref 80.0–100.0)
MPV: 9.6 fL (ref 7.5–12.5)
Monocytes Relative: 6.9 %
Neutro Abs: 3210 cells/uL (ref 1500–7800)
Neutrophils Relative %: 64.2 %
Platelets: 288 10*3/uL (ref 140–400)
RBC: 4.96 10*6/uL (ref 3.80–5.10)
RDW: 11.8 % (ref 11.0–15.0)
Total Lymphocyte: 25.7 %
WBC mixed population: 345 cells/uL (ref 200–950)
WBC: 5 10*3/uL (ref 3.8–10.8)

## 2017-10-30 LAB — COMPLETE METABOLIC PANEL WITH GFR
AG Ratio: 2.5 (calc) (ref 1.0–2.5)
ALT: 16 U/L (ref 6–29)
AST: 15 U/L (ref 10–35)
Albumin: 5 g/dL (ref 3.6–5.1)
Alkaline phosphatase (APISO): 93 U/L (ref 33–130)
BUN/Creatinine Ratio: 18 (calc) (ref 6–22)
BUN: 18 mg/dL (ref 7–25)
CO2: 30 mmol/L (ref 20–32)
Calcium: 10.4 mg/dL (ref 8.6–10.4)
Chloride: 104 mmol/L (ref 98–110)
Creat: 1.02 mg/dL — ABNORMAL HIGH (ref 0.50–0.99)
GFR, Est African American: 67 mL/min/{1.73_m2} (ref >=60)
GFR, Est Non African American: 58 mL/min/{1.73_m2} — ABNORMAL LOW (ref >=60)
Globulin: 2 g/dL (calc) (ref 1.9–3.7)
Glucose, Bld: 96 mg/dL (ref 65–99)
Potassium: 4.7 mmol/L (ref 3.5–5.3)
Sodium: 142 mmol/L (ref 135–146)
Total Bilirubin: 0.5 mg/dL (ref 0.2–1.2)
Total Protein: 7 g/dL (ref 6.1–8.1)

## 2017-10-30 LAB — MAGNESIUM: Magnesium: 2.1 mg/dL (ref 1.5–2.5)

## 2017-10-30 LAB — LIPID PANEL
Cholesterol: 261 mg/dL — ABNORMAL HIGH (ref <200)
HDL: 59 mg/dL (ref >50)
LDL Cholesterol (Calc): 163 mg/dL (calc) — ABNORMAL HIGH
Non-HDL Cholesterol (Calc): 202 mg/dL (calc) — ABNORMAL HIGH (ref <130)
Total CHOL/HDL Ratio: 4.4 (calc) (ref <5.0)
Triglycerides: 233 mg/dL — ABNORMAL HIGH (ref <150)

## 2017-10-30 LAB — TSH: TSH: 2.1 mIU/L (ref 0.40–4.50)

## 2017-11-19 ENCOUNTER — Other Ambulatory Visit: Payer: Self-pay | Admitting: Internal Medicine

## 2017-12-03 ENCOUNTER — Other Ambulatory Visit: Payer: Self-pay | Admitting: Obstetrics & Gynecology

## 2017-12-04 NOTE — Telephone Encounter (Signed)
Medication refill request: estradiol Last AEX:  12/11/2016 Next AEX: 02/08/2018 Last MMG (if hormonal medication request): 03/16/2017 BI-RADS CATEGORY  1: Negative. Refill authorized: #24, 4 refills

## 2017-12-14 ENCOUNTER — Ambulatory Visit: Payer: 59 | Admitting: Obstetrics & Gynecology

## 2018-01-28 ENCOUNTER — Encounter: Payer: Self-pay | Admitting: Adult Health

## 2018-01-28 ENCOUNTER — Ambulatory Visit: Payer: 59 | Admitting: Adult Health

## 2018-01-28 VITALS — BP 108/60 | HR 90 | Temp 97.7°F | Ht 66.0 in | Wt 150.0 lb

## 2018-01-28 DIAGNOSIS — J060 Acute laryngopharyngitis: Secondary | ICD-10-CM | POA: Diagnosis not present

## 2018-01-28 MED ORDER — PREDNISONE 20 MG PO TABS: ORAL_TABLET | ORAL | 0 refills | Status: DC

## 2018-01-28 MED ORDER — AMOXICILLIN-POT CLAVULANATE 875-125 MG PO TABS: 1.0000 | ORAL_TABLET | Freq: Two times a day (BID) | ORAL | 0 refills | Status: AC

## 2018-01-28 MED ORDER — PROMETHAZINE-DM 6.25-15 MG/5ML PO SYRP: 5.0000 mL | ORAL_SOLUTION | Freq: Four times a day (QID) | ORAL | 1 refills | Status: DC | PRN

## 2018-01-28 MED ORDER — FLUCONAZOLE 150 MG PO TABS: 150.0000 mg | ORAL_TABLET | Freq: Once | ORAL | 3 refills | Status: AC

## 2018-01-28 NOTE — Patient Instructions (Signed)

## 2018-01-28 NOTE — Progress Notes (Signed)
Assessment and Plan:  Rozell was seen today for uri.  Diagnoses and all orders for this visit:  Acute laryngopharyngitis Suggested symptomatic OTC remedies, hydration, humidifier, voice rest Nasal saline spray for congestion. Nasal steroids, allergy pill, oral steroids offered Follow up as needed. -     predniSONE (DELTASONE) 20 MG tablet; 2 tablets daily for 3 days, 1 tablet daily for 4 days. -     amoxicillin-clavulanate (AUGMENTIN) 875-125 MG tablet; Take 1 tablet by mouth 2 (two) times daily for 7 days. -     fluconazole (DIFLUCAN) 150 MG tablet; Take 1 tablet (150 mg total) by mouth once for 1 dose. -     promethazine-dextromethorphan (PROMETHAZINE-DM) 6.25-15 MG/5ML syrup; Take 5 mLs by mouth 4 (four) times daily as needed for cough.  Further disposition pending results of labs. Discussed med's effects and SE's.   Over 15 minutes of exam, counseling, chart review, and critical decision making was performed.   Future Appointments  Date Time Provider [place]  02/08/2018 10:00 AM Megan Salon, MD [place] None  02/21/2018 10:00 AM Vicie Mutters, PA-C [facility] None    ------------------------------------------------------------------------------------------------------------------   HPI BP 108/60   Pulse 90   Temp 97.7 F (36.5 C)   Ht 5\' 6"  (1.676 m)   Wt 150 lb (68 kg)   LMP 12/05/2010   SpO2 98%   BMI 24.21 kg/m   67 y.o.female presents for evaluation of URI symptoms ongoing for 1 week; started with nasal congestion (green mucus) of increasing amount, starting to settle into chest, has had productive cough, very thick phlegm, sore throat after cough, starting to get hoarse. She reports she did have fever earlier in course but has resolved. Denies GI symptoms. She does have ongoing mild headache, denies dizziness, vision changes. Overall symptoms are not improving, seems to be getting worse.   She has been taking mucinex, sudafed, ibuprofen, and started a  night time antihistamine.   She is just back from [place], several family members had similar symptoms last week.    She reports zpaks have typically not been beneficial for her with URIs  Past Medical History:  Diagnosis Date  . AC (acromioclavicular) joint bone spurs   . Anemia   . Anxiety   . Degenerative arthritis   . Depression   . GERD (gastroesophageal reflux disease)   . Goiter    multi nodular   . History of PCOS   . Hyperlipidemia   . Hypertension   . Nevus of left eye   . Nuclear cataract 06/13/2011  . PCOS (polycystic ovarian syndrome)   . Status post intraocular lens implant 06/13/2011     Allergies  Allergen Reactions  . Bactrim [Sulfamethoxazole-Trimethoprim] Itching  . Lipitor [Atorvastatin] Other (See Comments)    headache  . Monistat [Miconazole] Itching    burning    Current Outpatient Medications on File Prior to Visit  Medication Sig  . amLODipine (NORVASC) 2.5 MG tablet TAKE 1 TABLET BY MOUTH  DAILY  . aspirin EC 81 MG tablet Take 81 mg by mouth daily.  . B Complex Vitamins (VITAMIN B COMPLEX PO) Take by mouth.  . Cetirizine HCl (ZYRTEC ALLERGY PO) Take by mouth as needed.   . Cholecalciferol (VITAMIN D PO) Take 5,000 Units by mouth daily.  . COD LIVER OIL PO Take by mouth daily.  . Coenzyme Q10 100 MG TABS Take by mouth daily.  . diphenhydrAMINE (BENADRYL) 25 MG tablet Take 25 mg by mouth every 6 (six) hours  as needed.  . Estradiol 10 MCG TABS vaginal tablet INSERT 1 TABLET VAGINALLY  TWICE WEEKLY  . GLUCOSAMINE-CHONDROITIN PO Take 1,500 Units by mouth daily.   Marland Kitchen MAGNESIUM PO Take 800 Units by mouth daily.   Marland Kitchen MELATONIN PO Take by mouth daily. Reported on 08/13/2015  . Multiple Vitamins-Minerals (HAIR/SKIN/NAILS PO) Take 3 tablets by mouth daily.  . Omega-3 Fatty Acids (FISH OIL PO) Take 1,200 mg by mouth.   . Probiotic Product (PROBIOTIC DAILY PO) Take by mouth daily.  . rizatriptan (MAXALT) 10 MG tablet Take by mouth.  . rosuvastatin (CRESTOR)  40 MG tablet TAKE 1 TABLET BY MOUTH  EVERY DAY FOR CHOLESTEROL  . spironolactone (ALDACTONE) 100 MG tablet TAKE 2 TABLETS BY MOUTH  DAILY  . valACYclovir (VALTREX) 1000 MG tablet Take 1 tablet (1,000 mg total) by mouth daily.   No current facility-administered medications on file prior to visit.     ROS: all negative except above.   Physical Exam:  BP 108/60   Pulse 90   Temp 97.7 F (36.5 C)   Ht 5\' 6"  (1.676 m)   Wt 150 lb (68 kg)   LMP 12/05/2010   SpO2 98%   BMI 24.21 kg/m   General Appearance: Well nourished, in no apparent distress. Eyes: PERRLA, conjunctiva no swelling or erythema Sinuses: No Frontal/maxillary tenderness ENT/Mouth: Ext aud canals clear, TMs without erythema, bulging. No erythema, swelling, or exudate on post pharynx.  Tonsils not swollen or erythematous. Hearing normal. Mildly hoarse.  Neck: Supple, thyroid normal.  Respiratory: Respiratory effort normal, BS equal bilaterally without rales, rhonchi, wheezing or stridor.  Cardio: RRR with no MRGs. Brisk peripheral pulses without edema.  Abdomen: Soft, + BS.  Non tender. Lymphatics: Non tender without lymphadenopathy.  Musculoskeletal: normal gait.  Skin: Warm, dry without rashes, lesions, ecchymosis.  Neuro: Cranial nerves intact. Normal muscle tone, no cerebellar symptoms. Psych: Awake and oriented X 3, normal affect, Insight and Judgment appropriate.     Izora Ribas, NP 11:21 AM [facility]

## 2018-02-08 ENCOUNTER — Encounter: Payer: Self-pay | Admitting: Obstetrics & Gynecology

## 2018-02-08 ENCOUNTER — Other Ambulatory Visit: Payer: Self-pay

## 2018-02-08 ENCOUNTER — Ambulatory Visit: Payer: 59 | Admitting: Obstetrics & Gynecology

## 2018-02-08 ENCOUNTER — Other Ambulatory Visit (HOSPITAL_COMMUNITY)
Admission: RE | Admit: 2018-02-08 | Discharge: 2018-02-08 | Disposition: A | Discharge status: Home / Self Care | Payer: 59 | Source: Ambulatory Visit | Attending: Obstetrics & Gynecology | Admitting: Obstetrics & Gynecology

## 2018-02-08 VITALS — BP 116/82 | HR 88 | Resp 16 | Ht 66.0 in | Wt 144.2 lb

## 2018-02-08 DIAGNOSIS — Z01419 Encounter for gynecological examination (general) (routine) without abnormal findings: Secondary | ICD-10-CM | POA: Diagnosis not present

## 2018-02-08 DIAGNOSIS — Z124 Encounter for screening for malignant neoplasm of cervix: Secondary | ICD-10-CM | POA: Insufficient documentation

## 2018-02-08 NOTE — Progress Notes (Signed)
67 y.o. [id number] Married White or Caucasian female here for annual exam.  Did not sleep well.  There was a lot of noise around her house last night.    Denies vaginal bleeding.    Did get a flu shot this year.    Patient's last menstrual period was 12/05/2010.          Sexually active: Yes.    The current method of family planning is post menopausal status.    Exercising: Yes.    Classes, walk Smoker:  no  Health Maintenance: Pap:  08/13/15 neg. HR HPV:neg   05/29/13 Neg  History of abnormal Pap:  yes MMG:  03/16/17 BIRADS1:Neg  Colonoscopy:  02/2015 Normal. F/u 10 years  BMD:   01/21/14 Normal  TDaP: 11/28/2010 Pneumonia vaccine(s):  2018 Shingrix:   10/18, 1/19 Hep C testing: has donated blood in the past Screening Labs: PCP   reports that she has never smoked. She has never used smokeless tobacco. She reports that she drinks alcohol. She reports that she does not use drugs.  Past Medical History:  Diagnosis Date  . AC (acromioclavicular) joint bone spurs   . Anemia   . Anxiety   . Degenerative arthritis   . Depression   . GERD (gastroesophageal reflux disease)   . Goiter    multi nodular   . History of PCOS   . Hyperlipidemia   . Hypertension   . Nevus of left eye   . Nuclear cataract 06/13/2011  . PCOS (polycystic ovarian syndrome)   . Status post intraocular lens implant 06/13/2011    Past Surgical History:  Procedure Laterality Date  . BUNIONECTOMY Bilateral 2006   Feet  . HERNIA REPAIR Right 1959  . TOE SURGERY Left 2006   Bone Spur  . TONSILLECTOMY AND ADENOIDECTOMY  1959  . TUBAL LIGATION  1995    Current Outpatient Medications  Medication Sig Dispense Refill  . amLODipine (NORVASC) 2.5 MG tablet TAKE 1 TABLET BY MOUTH  DAILY 90 tablet 1  . aspirin EC 81 MG tablet Take 81 mg by mouth daily.    . B Complex Vitamins (VITAMIN B COMPLEX PO) Take by mouth.    . Cetirizine HCl (ZYRTEC ALLERGY PO) Take by mouth as needed.     . Cholecalciferol (VITAMIN D PO)  Take 5,000 Units by mouth daily.    . Clindamycin-Benzoyl Per, Refr, gel USE AS DIRECTED TWICE A DAY AS SPOT TREATMENT EXTERNALLY 30 DAYS  2  . COD LIVER OIL PO Take by mouth daily.    . Coenzyme Q10 100 MG TABS Take by mouth daily.    . diphenhydrAMINE (BENADRYL) 25 MG tablet Take 25 mg by mouth every 6 (six) hours as needed.    Marland Kitchen estradiol (ESTRACE) 0.1 MG/GM vaginal cream daily as needed.    . Estradiol 10 MCG TABS vaginal tablet INSERT 1 TABLET VAGINALLY  TWICE WEEKLY 24 tablet 0  . GLUCOSAMINE-CHONDROITIN PO Take 1,500 Units by mouth daily.     Marland Kitchen MAGNESIUM PO Take 800 Units by mouth daily.     Marland Kitchen MELATONIN PO Take by mouth daily. Reported on 08/13/2015    . Multiple Vitamins-Minerals (HAIR/SKIN/NAILS PO) Take 3 tablets by mouth daily.    . Omega-3 Fatty Acids (FISH OIL PO) Take 1,200 mg by mouth.     . Probiotic Product (PROBIOTIC DAILY PO) Take by mouth daily.    . rizatriptan (MAXALT) 10 MG tablet Take by mouth.    . rosuvastatin (CRESTOR) 40  MG tablet TAKE 1 TABLET BY MOUTH  EVERY DAY FOR CHOLESTEROL 90 tablet 0  . spironolactone (ALDACTONE) 100 MG tablet TAKE 2 TABLETS BY MOUTH  DAILY 180 tablet 1  . valACYclovir (VALTREX) 1000 MG tablet Take 1 tablet (1,000 mg total) by mouth daily. 90 tablet 1   No current facility-administered medications for this visit.     Family History  Problem Relation Age of Onset  . Thyroid disease Mother        hyperthyroidism  . Hypertension Mother   . Cancer Mother   . Hyperlipidemia Mother   . Cancer Father   . Cancer Brother 9       sarcoma  . Heart disease Brother   . Hyperlipidemia Brother   . Stroke Paternal Grandmother   . Colon cancer Neg Hx   . Esophageal cancer Neg Hx   . Rectal cancer Neg Hx   . Stomach cancer Neg Hx     Review of Systems  All other systems reviewed and are negative.   Exam:   BP 116/82 (BP Location: Right Arm, Patient Position: Sitting, Cuff Size: Normal)   Pulse 88   Resp 16   Ht 5\' 6"  (1.676 m)   Wt  144 lb 3.2 oz (65.4 kg)   LMP 12/05/2010   BMI 23.27 kg/m   Height:   Height: 5\' 6"  (167.6 cm)  Ht Readings from Last 3 Encounters:  02/08/18 5\' 6"  (1.676 m)  01/28/18 5\' 6"  (1.676 m)  10/29/17 5\' 6"  (1.676 m)    General appearance: alert, cooperative and appears stated age Head: Normocephalic, without obvious abnormality, atraumatic Neck: no adenopathy, supple, symmetrical, trachea midline and thyroid normal to inspection and palpation Lungs: clear to auscultation bilaterally Breasts: normal appearance, no masses or tenderness Heart: regular rate and rhythm Abdomen: soft, non-tender; bowel sounds normal; no masses,  no organomegaly Extremities: extremities normal, atraumatic, no cyanosis or edema Skin: Skin color, texture, turgor normal. No rashes or lesions Lymph nodes: Cervical, supraclavicular, and axillary nodes normal. No abnormal inguinal nodes palpated Neurologic: Grossly normal   Pelvic: External genitalia:  no lesions              Urethra:  normal appearing urethra with no masses, tenderness or lesions              Bartholins and Skenes: normal                 Vagina: normal appearing vagina with normal color and discharge, no lesions              Cervix: no lesions              Pap taken: Yes.   Bimanual Exam:  Uterus:  normal size, contour, position, consistency, mobility, non-tender              Adnexa: normal adnexa and no mass, fullness, tenderness               Rectovaginal: Confirms               Anus:  normal sphincter tone, no lesions  Chaperone was present for exam.  A:  Well Woman with normal exam PMP, no HRT HO PCOS H/O Hirsutism H/o oral HSV  P:   Mammogram guidelines reviewed pap smear obtained today RF for vagifem 66mew pv twice weekly not needed today.  Will have [facility] rx when RF needed. Rx for estrace vaginal cream to be used prn.  Does not  need RF today either. Lab work and vaccines are UTD Plan BMD next year with mammogram Colonoscopy is  UTD return annually or prn

## 2018-02-11 LAB — CYTOLOGY - PAP
Adequacy: ABSENT
Diagnosis: NEGATIVE

## 2018-02-19 NOTE — Progress Notes (Signed)
Complete Physical  Assessment and Plan:  Essential hypertension - continue medications, DASH diet, exercise and monitor at home. Call if greater than 130/80.  -     CBC with Differential/Platelet -     BASIC METABOLIC PANEL WITH GFR -     Hepatic function panel -     TSH -     Urinalysis, Routine w reflex microscopic -     Microalbumin / creatinine urine ratio -     EKG 12-Lead  BMI 23.0-23.9, adult Monitor/at goal  Mixed hyperlipidemia -continue medications, check lipids, decrease fatty foods, increase activity.  -     Lipid panel  Medication management -     Magnesium  Postmenopausal atrophic vaginitis Continue meds  Vitamin D deficiency -     VITAMIN D 25 Hydroxy (Vit-D Deficiency, Fractures)  History of cold sores Continue valtrex  Migraine with aura and without status migrainosus, not intractable Continue meds PRN, avoid    Discussed med's effects and SE's. Screening labs and tests as requested with regular follow-up as recommended. Future Appointments  Date Time Provider [place]  03/03/2019 10:00 AM Vicie Mutters, PA-C [facility] None  06/20/2019 10:00 AM Megan Salon, MD [place] None    HPI  67 y.o. female  presents for a complete physical.  Her blood pressure has been controlled at home, today their BP is BP: 118/64.  She does workout. She denies chest pain, shortness of breath, dizziness.   She is on cholesterol medication 40mg  1/2 a pill 3 x a week and denies myalgias. Her cholesterol is at goal. The cholesterol last visit was:  Lab Results  Component Value Date   CHOL 261 (H) 10/29/2017   HDL 59 10/29/2017   LDLCALC 163 (H) 10/29/2017   TRIG 233 (H) 10/29/2017   CHOLHDL 4.4 10/29/2017    Last A1C in the office was:  Lab Results  Component Value Date   HGBA1C 5.3 12/24/2015   Patient is on Vitamin D supplement.   Lab Results  Component Value Date   VD25OH 47 02/08/2017       BMI is Body mass index is 23.15 kg/m., she is  working on diet and exercise. Wt Readings from Last 20 Encounters:  02/21/18 143 lb 6.4 oz (65 kg)  02/08/18 144 lb 3.2 oz (65.4 kg)  01/28/18 150 lb (68 kg)  10/29/17 141 lb 3.2 oz (64 kg)  06/11/17 145 lb 3.2 oz (65.9 kg)  02/08/17 144 lb 9.6 oz (65.6 kg)  12/11/16 145 lb (65.8 kg)  06/27/16 144 lb (65.3 kg)  12/24/15 146 lb (66.2 kg)  08/13/15 146 lb (66.2 kg)  07/06/15 143 lb (64.9 kg)  02/05/15 145 lb (65.8 kg)  12/28/14 145 lb 4.8 oz (65.9 kg)  12/24/14 139 lb (63 kg)  09/21/14 144 lb (65.3 kg)  06/05/14 138 lb (62.6 kg)  05/01/14 138 lb (62.6 kg)  03/04/14 140 lb (63.5 kg)  12/23/13 135 lb (61.2 kg)  09/15/13 137 lb (62.1 kg)    Current Medications:  Current Outpatient Medications on File Prior to Visit  Medication Sig Dispense Refill  . amLODipine (NORVASC) 2.5 MG tablet TAKE 1 TABLET BY MOUTH  DAILY 90 tablet 1  . aspirin EC 81 MG tablet Take 81 mg by mouth daily.    . B Complex Vitamins (VITAMIN B COMPLEX PO) Take by mouth.    . Cetirizine HCl (ZYRTEC ALLERGY PO) Take by mouth as needed.     . Cholecalciferol (VITAMIN D PO)  Take 5,000 Units by mouth daily.    . Clindamycin-Benzoyl Per, Refr, gel USE AS DIRECTED TWICE A DAY AS SPOT TREATMENT EXTERNALLY 30 DAYS  2  . COD LIVER OIL PO Take by mouth daily.    . Coenzyme Q10 100 MG TABS Take by mouth daily.    . diphenhydrAMINE (BENADRYL) 25 MG tablet Take 25 mg by mouth every 6 (six) hours as needed.    Marland Kitchen estradiol (ESTRACE) 0.1 MG/GM vaginal cream daily as needed.    . Estradiol 10 MCG TABS vaginal tablet INSERT 1 TABLET VAGINALLY  TWICE WEEKLY 24 tablet 0  . GLUCOSAMINE-CHONDROITIN PO Take 1,500 Units by mouth daily.     Marland Kitchen MAGNESIUM PO Take 800 Units by mouth daily.     Marland Kitchen MELATONIN PO Take by mouth daily. Reported on 08/13/2015    . Multiple Vitamins-Minerals (HAIR/SKIN/NAILS PO) Take 3 tablets by mouth daily.    . Omega-3 Fatty Acids (FISH OIL PO) Take 1,200 mg by mouth.     . Probiotic Product (PROBIOTIC DAILY PO)  Take by mouth daily.    . rizatriptan (MAXALT) 10 MG tablet Take by mouth.    . rosuvastatin (CRESTOR) 40 MG tablet TAKE 1 TABLET BY MOUTH  EVERY DAY FOR CHOLESTEROL 90 tablet 0  . spironolactone (ALDACTONE) 100 MG tablet TAKE 2 TABLETS BY MOUTH  DAILY 180 tablet 1  . valACYclovir (VALTREX) 1000 MG tablet Take 1 tablet (1,000 mg total) by mouth daily. 90 tablet 1   No current facility-administered medications on file prior to visit.     Health Maintenance:   Immunization History  Administered Date(s) Administered  . Influenza Split 12/23/2013, 12/24/2014  . Influenza Whole 12/16/2012  . Influenza, High Dose Seasonal PF 12/15/2016, 11/18/2017  . Influenza,inj,quad, With Preservative 12/24/2015  . Influenza-Unspecified 12/20/2016  . Pneumococcal Conjugate-13 12/15/2016  . Pneumococcal Polysaccharide-23 08/24/2004  . Tdap 11/28/2010  . Zoster 01/19/2012  . Zoster Recombinat (Shingrix) 12/27/2016, 04/03/2017    Tetanus:  2012 Pneumovax: 2006 Prevnar 13: 2018 Flu vaccine: 2019 Zostavax: 2013 Shingrix: 2018  PAP 02/2018 Dr. Sabra Heck MGM: 03/16/2017 DEXA: 01/21/14 Colonoscopy: 2016 Stress test 2006 Opthalmology:  Dr. Harle Stanford, and Dr. Annamaria Boots  Patient Care Team: Unk Pinto, MD as PCP - General (Internal Medicine) Megan Salon, MD as Consulting Physician (Gynecology) Carlena Bjornstad, MD as Consulting Physician (Cardiology) Inda Castle, MD (Inactive) as Consulting Physician (Gastroenterology) Monna Fam, MD as Consulting Physician (Ophthalmology) Gerarda Fraction, MD as Referring Physician (Ophthalmology) Glenna Fellows, MD as Attending Physician (Neurosurgery) [place], Jennefer Bravo, MD as Referring Physician (Dermatology) Marybelle Killings, MD as Consulting Physician (Orthopedic Surgery)  Allergies:  Allergies  Allergen Reactions  . Bactrim [Sulfamethoxazole-Trimethoprim] Itching  . Lipitor [Atorvastatin] Other (See Comments)    headache  . Monistat  [Miconazole] Itching    burning    Medical History:  Past Medical History:  Diagnosis Date  . AC (acromioclavicular) joint bone spurs   . Anxiety   . Degenerative arthritis   . Depression   . GERD (gastroesophageal reflux disease)   . Goiter    multi nodular   . History of anemia   . History of PCOS   . Hyperlipidemia   . Hypertension   . Nevus of left eye   . Nuclear cataract 06/13/2011  . PCOS (polycystic ovarian syndrome)   . Status post intraocular lens implant 06/13/2011   Allergies Allergies  Allergen Reactions  . Bactrim [Sulfamethoxazole-Trimethoprim] Itching  . Lipitor [Atorvastatin] Other (See Comments)  headache  . Monistat [Miconazole] Itching    burning    SURGICAL HISTORY She  has a past surgical history that includes Tubal ligation (1995); Tonsillectomy and adenoidectomy (1959); Bunionectomy (Bilateral, 2006); Toe Surgery (Left, 2006); and Hernia repair (Right, 1959). FAMILY HISTORY Her family history includes Cancer in her father and mother; Cancer (age of onset: 82) in her brother; Heart disease in her brother; Hyperlipidemia in her brother and mother; Hypertension in her mother; Stroke in her paternal grandmother; Thyroid disease in her mother. SOCIAL HISTORY She  reports that she has never smoked. She has never used smokeless tobacco. She reports current alcohol use. She reports that she does not use drugs.  Review of Systems: Review of Systems  Constitutional: Negative for chills, fever and malaise/fatigue.  HENT: Negative for congestion, ear pain and sore throat.   Eyes: Negative.   Respiratory: Negative for cough, shortness of breath and wheezing.   Cardiovascular: Negative for chest pain, palpitations and leg swelling.  Gastrointestinal: Negative for abdominal pain, blood in stool, constipation, diarrhea, heartburn and melena.  Genitourinary: Negative.   Skin: Negative.   Neurological: Negative for dizziness, sensory change, loss of consciousness  and headaches.  Psychiatric/Behavioral: Negative for depression. The patient is not nervous/anxious and does not have insomnia.     Physical Exam: Estimated body mass index is 23.15 kg/m as calculated from the following:   Height as of this encounter: 5\' 6"  (1.676 m).   Weight as of this encounter: 143 lb 6.4 oz (65 kg). BP 118/64   Pulse 72   Temp 98.1 F (36.7 C)   Ht 5\' 6"  (1.676 m)   Wt 143 lb 6.4 oz (65 kg)   LMP 12/05/2010   SpO2 95%   BMI 23.15 kg/m     Wt Readings from Last 3 Encounters:  02/21/18 143 lb 6.4 oz (65 kg)  02/08/18 144 lb 3.2 oz (65.4 kg)  01/28/18 150 lb (68 kg)    General Appearance: Well nourished well developed, in no apparent distress.  Eyes: PERRLA, EOMs, conjunctiva no swelling or erythema ENT/Mouth: Ear canals normal without obstruction, swelling, erythema, or discharge.  TMs normal bilaterally with no erythema, bulging, retraction, or loss of landmark.  Oropharynx moist and clear with no exudate, erythema, or swelling.   Neck: Supple, thyroid normal. No bruits.  No cervical adenopathy Respiratory: Respiratory effort normal, Breath sounds clear A&P without wheeze, rhonchi, rales.   Cardio: RRR without murmurs, rubs or gallops. Brisk peripheral pulses without edema.  Chest: symmetric, with normal excursions Breasts: Symmetric, without lumps, nipple discharge, retractions.  Abdomen: Soft, nontender, no guarding, rebound, hernias, masses, or organomegaly.  Lymphatics: Non tender without lymphadenopathy.  Genitourinary: Deferred to Gyn for yearly checkups Musculoskeletal: Full ROM all peripheral extremities,5/5 strength, and normal gait.  Skin: Warm, dry without rashes, lesions, ecchymosis. Lipoma right thigh.  Neuro: Awake and oriented X 3, Cranial nerves intact, reflexes equal bilaterally. Normal muscle tone, no cerebellar symptoms. Sensation intact.  Psych:  normal affect, Insight and Judgment appropriate.   EKG: WNL no ST changes.  Over 40  minutes of exam, counseling, chart review and critical decision making was performed  Vicie Mutters 10:16 AM [facility]

## 2018-02-20 ENCOUNTER — Other Ambulatory Visit: Payer: Self-pay | Admitting: Obstetrics & Gynecology

## 2018-02-21 ENCOUNTER — Ambulatory Visit: Payer: 59 | Admitting: Physician Assistant

## 2018-02-21 ENCOUNTER — Encounter: Payer: Self-pay | Admitting: Physician Assistant

## 2018-02-21 VITALS — BP 118/64 | HR 72 | Temp 98.1°F | Ht 66.0 in | Wt 143.4 lb

## 2018-02-21 DIAGNOSIS — G43109 Migraine with aura, not intractable, without status migrainosus: Secondary | ICD-10-CM

## 2018-02-21 DIAGNOSIS — E782 Mixed hyperlipidemia: Secondary | ICD-10-CM

## 2018-02-21 DIAGNOSIS — I10 Essential (primary) hypertension: Secondary | ICD-10-CM

## 2018-02-21 DIAGNOSIS — E559 Vitamin D deficiency, unspecified: Secondary | ICD-10-CM

## 2018-02-21 DIAGNOSIS — Z79899 Other long term (current) drug therapy: Secondary | ICD-10-CM

## 2018-02-21 DIAGNOSIS — Z0000 Encounter for general adult medical examination without abnormal findings: Secondary | ICD-10-CM

## 2018-02-21 DIAGNOSIS — Z0001 Encounter for general adult medical examination with abnormal findings: Secondary | ICD-10-CM

## 2018-02-21 DIAGNOSIS — N952 Postmenopausal atrophic vaginitis: Secondary | ICD-10-CM

## 2018-02-21 DIAGNOSIS — Z136 Encounter for screening for cardiovascular disorders: Secondary | ICD-10-CM | POA: Diagnosis not present

## 2018-02-21 NOTE — Telephone Encounter (Signed)
Medication refill request: Estradiol Last AEX:  02/08/18 SM Next AEX: 06/20/19 Last MMG (if hormonal medication request): 03/16/17 BIRADS 1 negative/density b Refill authorized: 12/04/17 #24 w/0 refills; today please advise

## 2018-02-21 NOTE — Patient Instructions (Addendum)
VENOUS INSUFFICIENCY Our lower leg venous system is not the most reliable, the heart does NOT pump fluid up, there is a valve system.  The muscles of the leg squeeze and the blood moves up and a valve opens and close, then they squeeze, blood moves up and valves open and closes keeping the blood moving towards the heart.  Lots can go wrong with this valve system.  If someone is sitting or standing without movement, everyone will get swelling.  THINGS TO DO:  Do not stand or sit in one position for long periods of time. Do not sit with your legs crossed. Rest with your legs raised during the day.  Your legs have to be higher than your heart so that gravity will force the valves to open, so please really elevate your legs.   Wear elastic stockings or support hose. Do not wear other tight, encircling garments around the legs, pelvis, or waist.  [employer]  has a wide variety of well priced compression stockings. [place], [place] [address] [address] [place] has a good cheap selection, I like the socks, they are not as hard to get on  Walk as much as possible to increase blood flow.  Raise the foot of your bed at night with 2-inch blocks.  SEEK MEDICAL CARE IF:   The skin around your ankle starts to break down.  You have pain, redness, tenderness, or hard swelling developing in your leg over a vein.  You are uncomfortable due to leg pain.  If you ever have shortness of breath with exertion or chest pain go to the ER.  Know what a healthy weight is for you (roughly BMI <25) and aim to maintain this  Aim for 7+ servings of fruits and vegetables daily  65-80+ fluid ounces of water or unsweet tea for healthy kidneys  Limit to max 1 drink of alcohol per day; avoid smoking/tobacco  Limit animal fats in diet for cholesterol and heart health - choose grass fed whenever available  Avoid highly processed foods, and foods high in saturated/trans fats  Aim for  low stress - take time to unwind and care for your mental health  Aim for 150 min of moderate intensity exercise weekly for heart health, and weights twice weekly for bone health  Aim for 7-9 hours of sleep daily   Lipoma  A lipoma is a noncancerous (benign) tumor that is made up of fat cells. This is a very common type of soft-tissue growth. Lipomas are usually found under the skin (subcutaneous). They may occur in any tissue of the body that contains fat. Common areas for lipomas to appear include the back, shoulders, buttocks, and thighs.  Lipomas grow slowly, and they are usually painless. Most lipomas do not cause problems and do not require treatment. What are the causes? The cause of this condition is not known. What increases the risk? You are more likely to develop this condition if:  You are 9-54 years old.  You have a family history of lipomas. What are the signs or symptoms? A lipoma usually appears as a small, round bump under the skin. In most cases, the lump will:  Feel soft or rubbery.  Not cause pain or other symptoms. However, if a lipoma is located in an area where it pushes on nerves, it can become painful or cause other symptoms. How is this diagnosed? A lipoma can usually be diagnosed with a physical exam. You may  also have tests to confirm the diagnosis and to rule out other conditions. Tests may include:  Imaging tests, such as a CT scan or MRI.  Removal of a tissue sample to be looked at under a microscope (biopsy). How is this treated? Treatment for this condition depends on the size of the lipoma and whether it is causing any symptoms.  For small lipomas that are not causing problems, no treatment is needed.  If a lipoma is bigger or it causes problems, surgery may be done to remove the lipoma. Lipomas can also be removed to improve appearance. Most often, the procedure is done after applying a medicine that numbs the area (local anesthetic). Follow  these instructions at home:  Watch your lipoma for any changes.  Keep all follow-up visits as told by your health care provider. This is important. Contact a health care provider if:  Your lipoma becomes larger or hard.  Your lipoma becomes painful, red, or increasingly swollen. These could be signs of infection or a more serious condition. Get help right away if:  You develop tingling or numbness in an area near the lipoma. This could indicate that the lipoma is causing nerve damage. Summary  A lipoma is a noncancerous tumor that is made up of fat cells.  Most lipomas do not cause problems and do not require treatment.  If a lipoma is bigger or it causes problems, surgery may be done to remove the lipoma. This information is not intended to replace advice given to you by your health care provider. Make sure you discuss any questions you have with your health care provider. Document Released: 02/10/2002 Document Revised: 02/06/2017 Document Reviewed: 02/06/2017 Elsevier Interactive Patient Education  2019 [employer].

## 2018-02-22 LAB — CBC WITH DIFFERENTIAL/PLATELET
Absolute Monocytes: 352 cells/uL (ref 200–950)
Basophils Absolute: 82 cells/uL (ref 0–200)
Basophils Relative: 1.6 %
Eosinophils Absolute: 189 cells/uL (ref 15–500)
Eosinophils Relative: 3.7 %
HCT: 45.2 % — ABNORMAL HIGH (ref 35.0–45.0)
Hemoglobin: 15.6 g/dL — ABNORMAL HIGH (ref 11.7–15.5)
Lymphs Abs: 1239 cells/uL (ref 850–3900)
MCH: 31 pg (ref 27.0–33.0)
MCHC: 34.5 g/dL (ref 32.0–36.0)
MCV: 89.7 fL (ref 80.0–100.0)
MPV: 9.7 fL (ref 7.5–12.5)
Monocytes Relative: 6.9 %
Neutro Abs: 3239 cells/uL (ref 1500–7800)
Neutrophils Relative %: 63.5 %
Platelets: 289 10*3/uL (ref 140–400)
RBC: 5.04 10*6/uL (ref 3.80–5.10)
RDW: 12.1 % (ref 11.0–15.0)
Total Lymphocyte: 24.3 %
WBC: 5.1 10*3/uL (ref 3.8–10.8)

## 2018-02-22 LAB — URINALYSIS, ROUTINE W REFLEX MICROSCOPIC
Bilirubin Urine: NEGATIVE
Glucose, UA: NEGATIVE
Hgb urine dipstick: NEGATIVE
Ketones, ur: NEGATIVE
Leukocytes, UA: NEGATIVE
Nitrite: NEGATIVE
Protein, ur: NEGATIVE
Specific Gravity, Urine: 1.021 (ref 1.001–1.03)
pH: 5.5 (ref 5.0–8.0)

## 2018-02-22 LAB — LIPID PANEL
Cholesterol: 218 mg/dL — ABNORMAL HIGH (ref <200)
HDL: 73 mg/dL (ref >50)
LDL Cholesterol (Calc): 119 mg/dL (calc) — ABNORMAL HIGH
Non-HDL Cholesterol (Calc): 145 mg/dL (calc) — ABNORMAL HIGH (ref <130)
Total CHOL/HDL Ratio: 3 (calc) (ref <5.0)
Triglycerides: 145 mg/dL (ref <150)

## 2018-02-22 LAB — COMPLETE METABOLIC PANEL WITH GFR
AG Ratio: 2.3 (calc) (ref 1.0–2.5)
ALT: 21 U/L (ref 6–29)
AST: 17 U/L (ref 10–35)
Albumin: 5.1 g/dL (ref 3.6–5.1)
Alkaline phosphatase (APISO): 106 U/L (ref 33–130)
BUN: 18 mg/dL (ref 7–25)
CO2: 29 mmol/L (ref 20–32)
Calcium: 10.4 mg/dL (ref 8.6–10.4)
Chloride: 99 mmol/L (ref 98–110)
Creat: 0.89 mg/dL (ref 0.50–0.99)
GFR, Est African American: 78 mL/min/{1.73_m2} (ref >=60)
GFR, Est Non African American: 68 mL/min/{1.73_m2} (ref >=60)
Globulin: 2.2 g/dL (calc) (ref 1.9–3.7)
Glucose, Bld: 102 mg/dL — ABNORMAL HIGH (ref 65–99)
Potassium: 4.4 mmol/L (ref 3.5–5.3)
Sodium: 137 mmol/L (ref 135–146)
Total Bilirubin: 0.5 mg/dL (ref 0.2–1.2)
Total Protein: 7.3 g/dL (ref 6.1–8.1)

## 2018-02-22 LAB — VITAMIN D 25 HYDROXY (VIT D DEFICIENCY, FRACTURES): Vit D, 25-Hydroxy: 68 ng/mL (ref 30–100)

## 2018-02-22 LAB — MICROALBUMIN / CREATININE URINE RATIO
Creatinine, Urine: 116 mg/dL (ref 20–275)
Microalb Creat Ratio: 3 mcg/mg creat (ref <30)
Microalb, Ur: 0.3 mg/dL

## 2018-02-22 LAB — MAGNESIUM: Magnesium: 2 mg/dL (ref 1.5–2.5)

## 2018-02-22 LAB — TSH: TSH: 2.14 mIU/L (ref 0.40–4.50)

## 2018-05-02 ENCOUNTER — Other Ambulatory Visit: Payer: Self-pay | Admitting: Obstetrics & Gynecology

## 2018-05-02 DIAGNOSIS — Z1231 Encounter for screening mammogram for malignant neoplasm of breast: Secondary | ICD-10-CM

## 2018-05-03 ENCOUNTER — Ambulatory Visit
Admission: RE | Admit: 2018-05-03 | Discharge: 2018-05-03 | Disposition: A | Discharge status: Home / Self Care | Payer: Medicare Other | Source: Ambulatory Visit | Attending: Obstetrics & Gynecology | Admitting: Obstetrics & Gynecology

## 2018-05-03 DIAGNOSIS — Z1231 Encounter for screening mammogram for malignant neoplasm of breast: Secondary | ICD-10-CM

## 2018-05-21 ENCOUNTER — Other Ambulatory Visit: Payer: Self-pay | Admitting: Physician Assistant

## 2018-05-21 ENCOUNTER — Other Ambulatory Visit: Payer: Self-pay | Admitting: *Deleted

## 2018-05-21 MED ORDER — ROSUVASTATIN CALCIUM 40 MG PO TABS: ORAL_TABLET | ORAL | 0 refills | Status: DC

## 2018-06-23 ENCOUNTER — Other Ambulatory Visit: Payer: Self-pay | Admitting: Internal Medicine

## 2018-07-06 MED ORDER — AMLODIPINE BESYLATE 2.5 MG PO TABS: 2.5000 mg | ORAL_TABLET | Freq: Every day | ORAL | 1 refills | Status: DC

## 2018-08-23 ENCOUNTER — Other Ambulatory Visit: Payer: Self-pay

## 2018-08-23 ENCOUNTER — Encounter: Payer: Self-pay | Admitting: Adult Health Nurse Practitioner

## 2018-08-23 ENCOUNTER — Ambulatory Visit: Payer: Medicare Other | Admitting: Adult Health Nurse Practitioner

## 2018-08-23 ENCOUNTER — Ambulatory Visit: Payer: Self-pay | Admitting: Adult Health Nurse Practitioner

## 2018-08-23 VITALS — BP 111/57 | HR 76 | Temp 98.1°F | Resp 16 | Ht 66.0 in | Wt 141.4 lb

## 2018-08-23 DIAGNOSIS — I10 Essential (primary) hypertension: Secondary | ICD-10-CM

## 2018-08-23 DIAGNOSIS — Z1329 Encounter for screening for other suspected endocrine disorder: Secondary | ICD-10-CM

## 2018-08-23 DIAGNOSIS — E041 Nontoxic single thyroid nodule: Secondary | ICD-10-CM

## 2018-08-23 DIAGNOSIS — G43109 Migraine with aura, not intractable, without status migrainosus: Secondary | ICD-10-CM | POA: Diagnosis not present

## 2018-08-23 DIAGNOSIS — E782 Mixed hyperlipidemia: Secondary | ICD-10-CM

## 2018-08-23 DIAGNOSIS — Z6822 Body mass index (BMI) 22.0-22.9, adult: Secondary | ICD-10-CM | POA: Diagnosis not present

## 2018-08-23 DIAGNOSIS — R6889 Other general symptoms and signs: Secondary | ICD-10-CM

## 2018-08-23 DIAGNOSIS — J302 Other seasonal allergic rhinitis: Secondary | ICD-10-CM

## 2018-08-23 DIAGNOSIS — Z0001 Encounter for general adult medical examination with abnormal findings: Secondary | ICD-10-CM | POA: Diagnosis not present

## 2018-08-23 DIAGNOSIS — Z79899 Other long term (current) drug therapy: Secondary | ICD-10-CM

## 2018-08-23 DIAGNOSIS — E559 Vitamin D deficiency, unspecified: Secondary | ICD-10-CM | POA: Diagnosis not present

## 2018-08-23 NOTE — Progress Notes (Signed)
Virtual Visit via Telephone Note  I connected with [name] on 08/23/18 at  9:00 AM EDT by telephone and verified that I am speaking with the correct person using two identifiers.   I discussed the limitations, risks, security and privacy concerns of performing an evaluation and management service by telephone and the availability of in person appointments. I also discussed with the patient that there may be a patient responsible charge related to this service. The patient expressed understanding and agreed to proceed.   MEDICARE ANNUAL WELLNESS VISIT AND 3 MONTH FOLLOW UP Assessment:   Diagnoses and all orders for this visit:  Essential hypertension Hypertension Continue medication: Aldacton 100mg , two tablets daily, norvasc 2.5mg  nightly Monitor blood pressure at home; call if consistently over 130/80 Continue DASH diet.   Reminder to go to the ER if any CP, SOB, nausea, dizziness, severe HA, changes vision/speech, left arm numbness and tingling and jaw pain. -CBC, CMP  Mixed hyperlipidemia Cholesterol Continue medications:Crestor 20mg  three days a week as well as CoQ10 daily Continue low cholesterol diet and exercise.  Check lipid panel.   Vitamin D deficiency Vitamin D Def/ osteoporosis prevention Continue supplementation Check vitamin D level  Migraine with aura and without status migrainosus, not intractable Doing well at this time Will send in Maxalt PRN refill  BMI 22.0-22.9, adult Discussed dietary and exercise modiifcations  Thyroid nodule, Screening for thyroid disorder Doing well Will check TSH  Seasonal Allergies Taking cetrizine PRN, switches to Allegra Well controlled Continue to monitor  Medication management Continued  Due for routine labs, will place order, may set up lab appointment.   Follow Up Instructions:    I discussed the assessment and treatment plan with the patient. The patient was provided an opportunity to ask questions and  all were answered. The patient agreed with the plan and demonstrated an understanding of the instructions.   The patient was advised to call back or seek an in-person evaluation if the symptoms worsen or if the condition fails to improve as anticipated.  I provided 30 minutes of non-face-to-face time during this encounter including counseling, chart review, and critical decision making was preformed.   Future Appointments  Date Time Provider [place]  03/03/2019 10:00 AM Vicie Mutters, PA-C [facility] None  06/20/2019 10:00 AM Megan Salon, MD [place] None      Plan:   During the course of the visit the patient was educated and counseled about appropriate screening and preventive services including:    Pneumococcal vaccine   Influenza vaccine  Prevnar 13  Td vaccine  Screening electrocardiogram, deferred, telephone visit [place].  Colorectal cancer screening  Diabetes screening  Glaucoma screening  Nutrition counseling    Subjective:  Christine [name] is a 68 y.o. female who presents for Medicare Annual Wellness Visit and 3 month follow up for HTN, hyperlipidemia, abnormal glucose, and vitamin D Def.   She reports four week ago she reports that the lymph nodes in her neck were swollen from the base of her ear with the left worse than the right.  After 2 weeks this was resolved. She did not take anything for this.   Her blood pressure has been controlled at home, today their BP is   She does workout. She denies chest pain, shortness of breath, dizziness.  She is on cholesterol medication and denies myalgias. Her cholesterol is not at goal. The cholesterol last visit was:   Lab Results  Component Value Date   CHOL 218 (  H) 02/21/2018   HDL 73 02/21/2018   LDLCALC 119 (H) 02/21/2018   TRIG 145 02/21/2018   CHOLHDL 3.0 02/21/2018   She has been working on diet and exercise for prediabetes with mild CKD, and denies nausea, paresthesia of the feet,  polydipsia, polyuria, visual disturbances and vomiting. Last A1C in the office was:  Lab Results  Component Value Date   HGBA1C 5.3 12/24/2015   Last GFR Lab Results  Component Value Date   GFRNONAA 68 02/21/2018     Lab Results  Component Value Date   GFRAA 78 02/21/2018   Patient is on Vitamin D supplement.   Lab Results  Component Value Date   VD25OH 68 02/21/2018      Medication Review:   Current Outpatient Medications (Cardiovascular):  .  amLODipine (NORVASC) 2.5 MG tablet, Take 1 tablet (2.5 mg total) by mouth daily. .  rosuvastatin (CRESTOR) 40 MG tablet, TAKE 1 TABLET BY MOUTH  DAILY FOR CHOLESTEROL .  spironolactone (ALDACTONE) 100 MG tablet, TAKE 2 TABLETS BY MOUTH  DAILY  Current Outpatient Medications (Respiratory):  Marland Kitchen  Cetirizine HCl (ZYRTEC ALLERGY PO), Take by mouth as needed.  .  diphenhydrAMINE (BENADRYL) 25 MG tablet, Take 25 mg by mouth every 6 (six) hours as needed.  Current Outpatient Medications (Analgesics):  .  aspirin EC 81 MG tablet, Take 81 mg by mouth daily. .  rizatriptan (MAXALT) 10 MG tablet, Take by mouth.   Current Outpatient Medications (Other):  Marland Kitchen  B Complex Vitamins (VITAMIN B COMPLEX PO), Take by mouth. .  Cholecalciferol (VITAMIN D PO), Take 5,000 Units by mouth daily. .  Clindamycin-Benzoyl Per, Refr, gel, USE AS DIRECTED TWICE A DAY AS SPOT TREATMENT EXTERNALLY 30 DAYS .  COD LIVER OIL PO, Take by mouth daily. .  Coenzyme Q10 100 MG TABS, Take by mouth daily. Marland Kitchen  estradiol (ESTRACE) 0.1 MG/GM vaginal cream, daily as needed. .  Estradiol 10 MCG TABS vaginal tablet, INSERT 1 TABLET VAGINALLY  TWICE WEEKLY .  GLUCOSAMINE-CHONDROITIN PO, Take 1,500 Units by mouth daily.  Marland Kitchen  MAGNESIUM PO, Take 800 Units by mouth daily.  Marland Kitchen  MELATONIN PO, Take by mouth daily. Reported on 08/13/2015 .  Multiple Vitamins-Minerals (HAIR/SKIN/NAILS PO), Take 3 tablets by mouth daily. .  Omega-3 Fatty Acids (FISH OIL PO), Take 1,200 mg by mouth.  .   Probiotic Product (PROBIOTIC DAILY PO), Take by mouth daily. .  valACYclovir (VALTREX) 1000 MG tablet, TAKE 1 TABLET BY MOUTH  DAILY  Allergies: Allergies  Allergen Reactions  . Bactrim [Sulfamethoxazole-Trimethoprim] Itching  . Lipitor [Atorvastatin] Other (See Comments)    headache  . Monistat [Miconazole] Itching    burning    Current Problems (verified) has Postmenopausal atrophic vaginitis; Essential hypertension; Mixed hyperlipidemia; Vitamin D deficiency; Medication management; History of cold sores; and Migraine on their problem list.  Screening Tests Immunization History  Administered Date(s) Administered  . Influenza Split 12/23/2013, 12/24/2014  . Influenza Whole 12/16/2012  . Influenza, High Dose Seasonal PF 12/15/2016, 11/18/2017  . Influenza,inj,quad, With Preservative 12/24/2015  . Influenza-Unspecified 12/20/2016  . Pneumococcal Conjugate-13 12/15/2016  . Pneumococcal Polysaccharide-23 08/24/2004  . Tdap 11/28/2010  . Zoster 01/19/2012  . Zoster Recombinat (Shingrix) 12/27/2016, 04/03/2017    Preventative care: Last colonoscopy: 2016 Mammogram: 2020 Prior vaccinations: TD or Tdap: 2012  Influenza: 2019, due for 2020 Pneumococcal: 2006 Prevnar13: 2018 Shingles/Zostavax: 2019  Names of Other Physician/Practitioners you currently use: 1. [facility] here for primary care 2.  Eye Exam - 02/2018, due for 2020 3. Dentist - Due, [place] Patient Care Team: Unk Pinto, MD as PCP - General (Internal Medicine) Megan Salon, MD as Consulting Physician (Gynecology) Carlena Bjornstad, MD as Consulting Physician (Cardiology) Inda Castle, MD (Inactive) as Consulting Physician (Gastroenterology) Monna Fam, MD as Consulting Physician (Ophthalmology) Gerarda Fraction, MD as Referring Physician (Ophthalmology) Glenna Fellows, MD as Attending Physician (Neurosurgery) [place], Jennefer Bravo, MD as Referring Physician  (Dermatology) Marybelle Killings, MD as Consulting Physician (Orthopedic Surgery)  Surgical: She  has a past surgical history that includes Tubal ligation (1995); Tonsillectomy and adenoidectomy (1959); Bunionectomy (Bilateral, 2006); Toe Surgery (Left, 2006); and Hernia repair (Right, 1959). Family Her family history includes Atrial fibrillation in her mother; Cancer in her father and mother; Cancer (age of onset: 46) in her brother; Dementia in her mother; Heart disease in her father; Heart disease (age of onset: 72) in her brother; Heart disease (age of onset: 81) in her mother; Hyperlipidemia in her brother and mother; Hypertension in her mother; Stroke in her paternal grandmother; Thyroid disease in her mother. Social history  She reports that she has never smoked. She has never used smokeless tobacco. She reports current alcohol use. She reports that she does not use drugs.  MEDICARE WELLNESS OBJECTIVES: Physical activity: Current Exercise Habits: Home exercise routine, Type of exercise: walking, Time (Minutes): 30, Frequency (Times/Week): 7, Weekly Exercise (Minutes/Week): 210, Intensity: Moderate Cardiac risk factors: Cardiac Risk Factors include: advanced age (>42men, >31 women);dyslipidemia;hypertension Depression/mood screen:   Depression screen [facility] 2/9 08/23/2018  Decreased Interest 0  Down, Depressed, Hopeless 0  PHQ - 2 Score 0    ADLs:  In your present state of health, do you have any difficulty performing the following activities: 08/23/2018  Hearing? N  Vision? N  Difficulty concentrating or making decisions? N  Walking or climbing stairs? N  Dressing or bathing? N  Doing errands, shopping? N  Preparing Food and eating ? N  Using the Toilet? N  In the past six months, have you accidently leaked urine? N  Do you have problems with loss of bowel control? N  Managing your Medications? N  Managing your Finances? N  Housekeeping or managing your Housekeeping? N  Some recent  data might be hidden     Cognitive Testing  Alert? Yes  Normal Appearance?Yes  Oriented to person? Yes  Place? Yes   Time? Yes  Recall of three objects?  Yes  Can perform simple calculations? Yes  Displays appropriate judgment?Yes  Can read the correct time from a watch face?Yes  EOL planning:     Objective:   There were no vitals filed for this visit. There is no height or weight on file to calculate BMI.  General : Well sounding patient in no apparent distress HEENT: no hoarseness, no cough for duration of visit Lungs: speaks in complete sentences, no audible wheezing, no apparent distress Neurological: alert, oriented x 3 Psychiatric: pleasant, judgement appropriate   Medicare Attestation I have personally reviewed: The patient's medical and social history Their use of alcohol, tobacco or illicit drugs Their current medications and supplements The patient's functional ability including ADLs,fall risks, home safety risks, cognitive, and hearing and visual impairment Diet and physical activities Evidence for depression or mood disorders  The patient's weight, height, BMI, and visual acuity have been recorded in the chart.  I have made referrals, counseling, and provided education to the patient based on review of the above and I have  provided the patient with a written personalized care plan for preventive services.     Garnet Sierras, NP [facility] 08/23/2018  9:23 AM

## 2018-08-27 MED ORDER — RIZATRIPTAN BENZOATE 10 MG PO TABS: ORAL_TABLET | ORAL | 1 refills | Status: DC

## 2018-08-28 ENCOUNTER — Other Ambulatory Visit: Payer: Medicare Other

## 2018-08-28 ENCOUNTER — Other Ambulatory Visit: Payer: Self-pay

## 2018-08-28 DIAGNOSIS — Z1329 Encounter for screening for other suspected endocrine disorder: Secondary | ICD-10-CM

## 2018-08-28 DIAGNOSIS — E041 Nontoxic single thyroid nodule: Secondary | ICD-10-CM

## 2018-08-28 DIAGNOSIS — Z79899 Other long term (current) drug therapy: Secondary | ICD-10-CM

## 2018-08-28 DIAGNOSIS — I10 Essential (primary) hypertension: Secondary | ICD-10-CM

## 2018-08-28 DIAGNOSIS — E782 Mixed hyperlipidemia: Secondary | ICD-10-CM

## 2018-08-28 DIAGNOSIS — E559 Vitamin D deficiency, unspecified: Secondary | ICD-10-CM

## 2018-08-28 DIAGNOSIS — Z6822 Body mass index (BMI) 22.0-22.9, adult: Secondary | ICD-10-CM

## 2018-08-29 LAB — COMPLETE METABOLIC PANEL WITH GFR
AG Ratio: 2.7 (calc) — ABNORMAL HIGH (ref 1.0–2.5)
ALT: 22 U/L (ref 6–29)
AST: 17 U/L (ref 10–35)
Albumin: 5.1 g/dL (ref 3.6–5.1)
Alkaline phosphatase (APISO): 107 U/L (ref 37–153)
BUN: 18 mg/dL (ref 7–25)
CO2: 28 mmol/L (ref 20–32)
Calcium: 10.1 mg/dL (ref 8.6–10.4)
Chloride: 102 mmol/L (ref 98–110)
Creat: 0.88 mg/dL (ref 0.50–0.99)
GFR, Est African American: 79 mL/min/{1.73_m2} (ref >=60)
GFR, Est Non African American: 68 mL/min/{1.73_m2} (ref >=60)
Globulin: 1.9 g/dL (calc) (ref 1.9–3.7)
Glucose, Bld: 96 mg/dL (ref 65–99)
Potassium: 4.3 mmol/L (ref 3.5–5.3)
Sodium: 138 mmol/L (ref 135–146)
Total Bilirubin: 0.6 mg/dL (ref 0.2–1.2)
Total Protein: 7 g/dL (ref 6.1–8.1)

## 2018-08-29 LAB — HEMOGLOBIN A1C
Hgb A1c MFr Bld: 5.6 % of total Hgb (ref <5.7)
Mean Plasma Glucose: 114 (calc)
eAG (mmol/L): 6.3 (calc)

## 2018-08-29 LAB — TSH: TSH: 3.41 mIU/L (ref 0.40–4.50)

## 2018-08-29 LAB — LIPID PANEL
Cholesterol: 198 mg/dL (ref <200)
HDL: 62 mg/dL (ref >=50)
LDL Cholesterol (Calc): 113 mg/dL (calc) — ABNORMAL HIGH
Non-HDL Cholesterol (Calc): 136 mg/dL (calc) — ABNORMAL HIGH (ref <130)
Total CHOL/HDL Ratio: 3.2 (calc) (ref <5.0)
Triglycerides: 124 mg/dL (ref <150)

## 2018-08-29 LAB — MAGNESIUM: Magnesium: 1.9 mg/dL (ref 1.5–2.5)

## 2018-08-29 LAB — INSULIN, RANDOM: Insulin: 4 u[IU]/mL

## 2018-08-29 LAB — VITAMIN D 25 HYDROXY (VIT D DEFICIENCY, FRACTURES): Vit D, 25-Hydroxy: 73 ng/mL (ref 30–100)

## 2018-10-14 ENCOUNTER — Encounter: Payer: Self-pay | Admitting: Physician Assistant

## 2018-11-01 ENCOUNTER — Other Ambulatory Visit: Payer: Self-pay | Admitting: Physician Assistant

## 2018-12-24 ENCOUNTER — Ambulatory Visit (INDEPENDENT_AMBULATORY_CARE_PROVIDER_SITE_OTHER): Payer: Medicare Other

## 2018-12-24 ENCOUNTER — Other Ambulatory Visit: Payer: Self-pay

## 2018-12-24 VITALS — Temp 97.7°F

## 2018-12-24 DIAGNOSIS — Z23 Encounter for immunization: Secondary | ICD-10-CM

## 2018-12-26 ENCOUNTER — Other Ambulatory Visit: Payer: Self-pay | Admitting: Obstetrics & Gynecology

## 2018-12-27 NOTE — Telephone Encounter (Signed)
Medication refill request: Estradiol  Last AEX:  02/08/18 Next AEX: 06/20/19 Last MMG (if hormonal medication request): 05/03/18  Bi-rads 1 neg  Refill authorized: #[id number] with 2 RF

## 2019-02-10 LAB — HM DIABETES EYE EXAM

## 2019-02-21 ENCOUNTER — Other Ambulatory Visit: Payer: Self-pay | Admitting: Internal Medicine

## 2019-03-03 ENCOUNTER — Encounter: Payer: Self-pay | Admitting: Physician Assistant

## 2019-03-03 ENCOUNTER — Encounter: Payer: Self-pay | Admitting: Internal Medicine

## 2019-03-10 NOTE — Progress Notes (Signed)
Complete Physical  Assessment and Plan:  Encounter for general adult medical examination with abnormal findings 1 year  Epigastric pain No RUQ pain, pending labs may get [place] but at this time sounds more like GERD/stricture -     Amylase -     Ambulatory referral to Gastroenterology- Internal [place] -     omeprazole (PRILOSEC) 40 MG capsule; Take 1 capsule (40 mg total) by mouth daily.  Dysphagia, unspecified type Treat GERD, will refer to GI for possible EGD No lymphadenaopathy Recent normal thyroid US 2019, unchanged thyroid on exam - if GI work up negative can get repeat US thyroid but does not seem like compressive symptoms at this time -     Ambulatory referral to Gastroenterology- Internal [place] -     omeprazole (PRILOSEC) 40 MG capsule; Take 1 capsule (40 mg total) by mouth daily.  Screening, anemia, deficiency, iron -     Iron,Total/Total Iron Binding Cap -     Vitamin B12  Essential hypertension - continue medications, DASH diet, exercise and monitor at home. Call if greater than 130/80.  -     CBC with Diff -     COMPLETE METABOLIC PANEL WITH GFR -     TSH -     Urinalysis, Routine w reflex microscopic -     Microalbumin / Creatinine Urine Ratio -     EKG 12-Lead  Medication management -     Magnesium  Migraine with aura and without status migrainosus, not intractable Avoid triggers, monitor  Mixed hyperlipidemia -     Lipid Profile check lipids decrease fatty foods increase activity.  Postmenopausal atrophic vaginitis Monitor  Vitamin D deficiency -     Vitamin D (25 hydroxy)  History of cold sores Monitor  Screening for thyroid disorder Check labs   Discussed med's effects and SE's. Screening labs and tests as requested with regular follow-up as recommended. Future Appointments  Date Time Provider [place]  06/20/2019 10:00 AM Megan Salon, MD [place] None  09/17/2019  9:00 AM Vicie Mutters, PA-C [facility] None  03/10/2020  9:00 AM  Vicie Mutters, PA-C [facility] None    HPI  69 y.o. female  presents for a complete physical.  She is having some swallowing difficulties worsening x 6 months. She is feeling that food is getting stuck. She states over 20 years ago, she tried to take an ASA without water and had it get stuck substernal chest pain, will still get it occ. She states at night she will feel that she has too much saliva and has trouble swallowing that, this has improved. She feels a "lump or tightness".  In Nov visiting her mom, she was eating salmon, spinach balls and she felt stuck, she drank water until she vomited water. Then she drank enough to finally get it down.                                    Has some issues with pills that don't have a coating.  She is on tumeric x years, has added vitamin C 1000mg  daily x several months and zinc.  No NSAIDS, very rare, once a month will take several times.  Once a week wine with a meal.  Has had GERD off and on, has been worse last few days.   Her blood pressure has been controlled at home, today their BP is BP: 118/72.  She does  workout. She denies chest pain, shortness of breath, dizziness.   She is on cholesterol medication 40mg  1/2 a pill 3 x a week and denies myalgias. Her cholesterol is at goal. The cholesterol last visit was:  Lab Results  Component Value Date   CHOL 198 08/28/2018   HDL 62 08/28/2018   LDLCALC 113 (H) 08/28/2018   TRIG 124 08/28/2018   CHOLHDL 3.2 08/28/2018    Last A1C in the office was:  Lab Results  Component Value Date   HGBA1C 5.6 08/28/2018   Patient is on Vitamin D supplement.   Lab Results  Component Value Date   VD25OH 73 08/28/2018       BMI is Body mass index is 23.24 kg/m., she is working on diet and exercise. Wt Readings from Last 20 Encounters:  03/11/19 144 lb (65.3 kg)  08/23/18 141 lb 6.4 oz (64.1 kg)  02/21/18 143 lb 6.4 oz (65 kg)  02/08/18 144 lb 3.2 oz (65.4 kg)  01/28/18 150 lb (68 kg)  10/29/17 141  lb 3.2 oz (64 kg)  06/11/17 145 lb 3.2 oz (65.9 kg)  02/08/17 144 lb 9.6 oz (65.6 kg)  12/11/16 145 lb (65.8 kg)  06/27/16 144 lb (65.3 kg)  12/24/15 146 lb (66.2 kg)  08/13/15 146 lb (66.2 kg)  07/06/15 143 lb (64.9 kg)  02/05/15 145 lb (65.8 kg)  12/28/14 145 lb 4.8 oz (65.9 kg)  12/24/14 139 lb (63 kg)  09/21/14 144 lb (65.3 kg)  06/05/14 138 lb (62.6 kg)  05/01/14 138 lb (62.6 kg)  03/04/14 140 lb (63.5 kg)    Current Medications:  Current Outpatient Medications on File Prior to Visit  Medication Sig Dispense Refill  . amLODipine (NORVASC) 2.5 MG tablet Take 1 tablet Daily for BP 90 tablet 3  . Ascorbic Acid (VITA-C PO) Take by mouth.    Marland Kitchen aspirin EC 81 MG tablet Take 81 mg by mouth daily.    . B Complex Vitamins (VITAMIN B COMPLEX PO) Take by mouth.    . Cetirizine HCl (ZYRTEC ALLERGY PO) Take by mouth as needed.     . Cholecalciferol (VITAMIN D PO) Take 5,000 Units by mouth daily.    . Clindamycin-Benzoyl Per, Refr, gel USE AS DIRECTED TWICE A DAY AS SPOT TREATMENT EXTERNALLY 30 DAYS  2  . COD LIVER OIL PO Take by mouth daily.    . Coenzyme Q10 100 MG TABS Take by mouth daily.    . diphenhydrAMINE (BENADRYL) 25 MG tablet Take 25 mg by mouth every 6 (six) hours as needed.    Marland Kitchen estradiol (ESTRACE) 0.1 MG/GM vaginal cream daily as needed.    . Estradiol 10 MCG TABS vaginal tablet INSERT 1 TABLET VAGINALLY  TWICE WEEKLY 24 tablet 2  . GLUCOSAMINE-CHONDROITIN PO Take 1,500 Units by mouth daily.     Marland Kitchen MAGNESIUM PO Take 800 Units by mouth daily.     Marland Kitchen MELATONIN PO Take by mouth daily. Reported on 08/13/2015    . Multiple Vitamins-Minerals (HAIR/SKIN/NAILS PO) Take 3 tablets by mouth daily.    . Multiple Vitamins-Minerals (ZINC PO) Take by mouth.    . Omega-3 Fatty Acids (FISH OIL PO) Take 1,200 mg by mouth.     . Probiotic Product (PROBIOTIC DAILY PO) Take by mouth daily.    . rizatriptan (MAXALT) 10 MG tablet Take 1 tablet (10 mg total) by mouth once as needed for migraine. May  repeat in 2 hours if needed 30 tablet 2  .  rosuvastatin (CRESTOR) 40 MG tablet TAKE 1 TABLET BY MOUTH  DAILY FOR CHOLESTEROL 90 tablet 3  . spironolactone (ALDACTONE) 100 MG tablet TAKE 2 TABLETS BY MOUTH  DAILY 180 tablet 1  . valACYclovir (VALTREX) 1000 MG tablet TAKE 1 TABLET BY MOUTH  DAILY 90 tablet 1   No current facility-administered medications on file prior to visit.    Health Maintenance:   Immunization History  Administered Date(s) Administered  . Influenza Split 12/23/2013, 12/24/2014  . Influenza Whole 12/16/2012  . Influenza, High Dose Seasonal PF 12/15/2016, 11/18/2017, 12/24/2018  . Influenza,inj,quad, With Preservative 12/24/2015  . Influenza-Unspecified 12/20/2016  . Pneumococcal Conjugate-13 12/15/2016  . Pneumococcal Polysaccharide-23 08/24/2004  . Tdap 11/28/2010  . Zoster 01/19/2012  . Zoster Recombinat (Shingrix) 12/27/2016, 04/03/2017    Tetanus:  2012 Pneumovax: 2006 Prevnar 13: 2018 Flu vaccine: 2020 Zostavax: 2013 Shingrix: 2018 Hepatits A and B  PAP 02/2018 Dr. Sabra Heck MGM: 04/2018 DEXA: 01/21/14 Colonoscopy: 2016 was seeing Dr. Peggye Ley Stress test 2006 US transvaginal 2014 US thyroid 2019: IMPRESSION: 1. Similar findings of multinodular goiter. No definitive worrisome new or enlarging thyroid nodules. 2. Previously identified thyroid nodules appear similar to the 07/2012 examination. Stability for 5 years is indicative of benign etiology and as such, percutaneous sampling and/or continued dedicated follow-up of any of the nodules is not recommended.  Opthalmology:  Dr. Harle Stanford, and Dr. Annamaria Boots  Patient Care Team: Unk Pinto, MD as PCP - General (Internal Medicine) Megan Salon, MD as Consulting Physician (Gynecology) Carlena Bjornstad, MD as Consulting Physician (Cardiology) Inda Castle, MD (Inactive) as Consulting Physician (Gastroenterology) Monna Fam, MD as Consulting Physician (Ophthalmology) Gerarda Fraction,  MD as Referring Physician (Ophthalmology) Glenna Fellows, MD as Attending Physician (Neurosurgery) [place], Jennefer Bravo, MD as Referring Physician (Dermatology) Marybelle Killings, MD as Consulting Physician (Orthopedic Surgery)  Allergies:  Allergies  Allergen Reactions  . Bactrim [Sulfamethoxazole-Trimethoprim] Itching  . Lipitor [Atorvastatin] Other (See Comments)    headache  . Monistat [Miconazole] Itching    burning    Medical History:  Past Medical History:  Diagnosis Date  . AC (acromioclavicular) joint bone spurs   . Anxiety   . Degenerative arthritis   . Depression   . GERD (gastroesophageal reflux disease)   . Goiter    multi nodular   . History of anemia   . History of PCOS   . Hyperlipidemia   . Hypertension   . Nevus of left eye   . Nuclear cataract 06/13/2011  . PCOS (polycystic ovarian syndrome)   . Status post intraocular lens implant 06/13/2011   Allergies Allergies  Allergen Reactions  . Bactrim [Sulfamethoxazole-Trimethoprim] Itching  . Lipitor [Atorvastatin] Other (See Comments)    headache  . Monistat [Miconazole] Itching    burning    SURGICAL HISTORY She  has a past surgical history that includes Tubal ligation (1995); Tonsillectomy and adenoidectomy (1959); Bunionectomy (Bilateral, 2006); Toe Surgery (Left, 2006); and Hernia repair (Right, 1959). FAMILY HISTORY Her family history includes Atrial fibrillation in her mother; Cancer in her father and mother; Cancer (age of onset: 22) in her brother; Dementia in her mother; Heart disease in her father; Heart disease (age of onset: 8) in her brother; Heart disease (age of onset: 61) in her mother; Hyperlipidemia in her brother and mother; Hypertension in her mother; Stroke in her paternal grandmother; Thyroid disease in her mother. SOCIAL HISTORY She  reports that she has never smoked. She has never used smokeless tobacco. She reports current alcohol  use. She reports that she does not use drugs.  Review of  Systems: Review of Systems  Constitutional: Negative for chills, diaphoresis, fever, malaise/fatigue and weight loss.  HENT: Positive for congestion. Negative for ear pain and sore throat.   Eyes: Negative.   Respiratory: Negative for cough, shortness of breath and wheezing.   Cardiovascular: Negative for chest pain, palpitations and leg swelling.  Gastrointestinal: Positive for heartburn. Negative for abdominal pain, blood in stool, constipation, diarrhea, melena, nausea and vomiting.  Genitourinary: Negative.   Skin: Negative.  Negative for rash.  Neurological: Negative for dizziness, sensory change, loss of consciousness and headaches.  Psychiatric/Behavioral: Negative for depression. The patient is not nervous/anxious and does not have insomnia.     Physical Exam: Estimated body mass index is 23.24 kg/m as calculated from the following:   Height as of this encounter: 5\' 6"  (1.676 m).   Weight as of this encounter: 144 lb (65.3 kg). BP 118/72   Pulse 60   Temp 97.6 F (36.4 C)   Ht 5\' 6"  (1.676 m)   Wt 144 lb (65.3 kg)   LMP 12/05/2010   SpO2 96%   BMI 23.24 kg/m     Wt Readings from Last 3 Encounters:  03/11/19 144 lb (65.3 kg)  08/23/18 141 lb 6.4 oz (64.1 kg)  02/21/18 143 lb 6.4 oz (65 kg)   General Appearance: Well norrished well developed, in no apparent distress.  Eyes: PERRLA, EOMs, conjunctiva no swelling or erythema ENT/Mouth: Ear canals normal without obstruction, swelling, erythema, or discharge.  TMs normal bilaterally with no erythema, bulging, retraction, or loss of landmark.  Oropharynx moist and clear with no exudate, erythema, or swelling.   Neck: Supple, thyroid normal. No bruits.  No cervical adenopathy Respiratory: Respiratory effort normal, Breath sounds clear A&P without wheeze, rhonchi, rales.   Cardio: RRR without murmurs, rubs or gallops. Brisk peripheral pulses without edema.  Chest: symmetric, with normal excursions Breasts: defer Abdomen:  Soft,+ epigastric tenderness, no guarding, no rebound, hernias, masses, or organomegaly.  Lymphatics: Non tender without lymphadenopathy.  Genitourinary: Deferred to Gyn for yearly checkups Musculoskeletal: Full ROM all peripheral extremities,5/5 strength, and normal gait.  Skin: Warm, dry without rashes, lesions, ecchymosis. Lipoma right thigh.  Neuro: Awake and oriented X 3, Cranial nerves intact, reflexes equal bilaterally. Normal muscle tone, no cerebellar symptoms. Sensation intact.  Psych:  normal affect, Insight and Judgment appropriate.   EKG: WNL no ST changes.  Over 40 minutes of exam, counseling, chart review and critical decision making was performed  Vicie Mutters 12:48 PM [facility]

## 2019-03-11 ENCOUNTER — Other Ambulatory Visit: Payer: Self-pay

## 2019-03-11 ENCOUNTER — Encounter: Payer: Self-pay | Admitting: Physician Assistant

## 2019-03-11 ENCOUNTER — Ambulatory Visit: Payer: Medicare Other | Admitting: Physician Assistant

## 2019-03-11 VITALS — BP 118/72 | HR 60 | Temp 97.6°F | Ht 66.0 in | Wt 144.0 lb

## 2019-03-11 DIAGNOSIS — Z1329 Encounter for screening for other suspected endocrine disorder: Secondary | ICD-10-CM

## 2019-03-11 DIAGNOSIS — E559 Vitamin D deficiency, unspecified: Secondary | ICD-10-CM

## 2019-03-11 DIAGNOSIS — I10 Essential (primary) hypertension: Secondary | ICD-10-CM

## 2019-03-11 DIAGNOSIS — E782 Mixed hyperlipidemia: Secondary | ICD-10-CM | POA: Diagnosis not present

## 2019-03-11 DIAGNOSIS — Z79899 Other long term (current) drug therapy: Secondary | ICD-10-CM | POA: Diagnosis not present

## 2019-03-11 DIAGNOSIS — Z130 Encounter for screening for diseases of the blood and blood-forming organs and certain disorders involving the immune mechanism: Secondary | ICD-10-CM

## 2019-03-11 DIAGNOSIS — Z8619 Personal history of other infectious and parasitic diseases: Secondary | ICD-10-CM

## 2019-03-11 DIAGNOSIS — Z136 Encounter for screening for cardiovascular disorders: Secondary | ICD-10-CM

## 2019-03-11 DIAGNOSIS — R1310 Dysphagia, unspecified: Secondary | ICD-10-CM

## 2019-03-11 DIAGNOSIS — G43109 Migraine with aura, not intractable, without status migrainosus: Secondary | ICD-10-CM

## 2019-03-11 DIAGNOSIS — Z0001 Encounter for general adult medical examination with abnormal findings: Secondary | ICD-10-CM

## 2019-03-11 DIAGNOSIS — N952 Postmenopausal atrophic vaginitis: Secondary | ICD-10-CM | POA: Diagnosis not present

## 2019-03-11 DIAGNOSIS — R1013 Epigastric pain: Secondary | ICD-10-CM

## 2019-03-11 MED ORDER — OMEPRAZOLE 40 MG PO CPDR: 40.0000 mg | DELAYED_RELEASE_CAPSULE | Freq: Every day | ORAL | 0 refills | Status: DC

## 2019-03-11 NOTE — Patient Instructions (Addendum)
Stop vitamin C, stop the tumeric Avoid NSAIDS like aleve, ibuprofen Stop wine/alcohol  Will refer to GI for evaluation for EGD  Will start on prilosec 40 mg once a day for 2 weeks at least.   Silent reflux: Not all heartburn burns...Marland KitchenMarland KitchenMarland Kitchen  What is LPR? Laryngopharyngeal reflux (LPR) or silent reflux is a condition in which acid that is made in the stomach travels up the esophagus (swallowing tube) and gets to the throat. Not everyone with reflux has a lot of heartburn or indigestion. In fact, many people with LPR never have heartburn. This is why LPR is called SILENT REFLUX, and the terms "Silent reflux" and "LPR" are often used interchangeably. Because LPR is silent, it is sometimes difficult to diagnose.  How can you tell if you have LPR?  Marland Kitchen Chronic hoarseness- Some people have hoarseness that comes and goes . throat clearing  . Cough . It can cause shortness of breath and cause asthma like symptoms. Marland Kitchen a feeling of a lump in the throat  . difficulty swallowing . a problem with too much nose and throat drainage.  . Some people will feel their esophagus spasm which feels like their heart beating hard and fast, this will usually be after a meal, at rest, or lying down at night.    How do I treat this? Treatment for LPR should be individualized, and your doctor will suggest the best treatment for you. Generally there are several treatments for LPR: . changing habits and diet to reduce reflux,  . medications to reduce stomach acid, and  . surgery to prevent reflux. Most people with LPR need to modify how and when they eat, as well as take some medication, to get well. Sometimes, nonprescription liquid antacids, such as Maalox, Gelucil and Mylanta are recommended. When used, these antacids should be taken four times each day - one tablespoon one hour after each meal and before bedtime. Dietary and lifestyle changes alone are not often enough to control LPR - medications that reduce stomach  acid are also usually needed. These must be prescribed by our doctor.   TIPS FOR REDUCING REFLUX AND LPR Control your LIFE-STYLE and your DIET! Marland Kitchen If you use tobacco, QUIT.  Marland Kitchen Smoking makes you reflux. After every cigarette you have some LPR.  . Don't wear clothing that is too tight, especially around the waist (trousers, corsets, belts).  . Do not lie down just after eating...in fact, do not eat within three hours of bedtime.  . You should be on a low-fat diet.  . Limit your intake of red meat.  . Limit your intake of butter.  Marland Kitchen Avoid fried foods.  . Avoid chocolate  . Avoid cheese.  Marland Kitchen Avoid eggs. Marland Kitchen Specifically avoid caffeine (especially coffee and tea), soda pop (especially cola) and mints.  . Avoid alcoholic beverages, particularly in the evening.

## 2019-03-12 LAB — URINALYSIS, ROUTINE W REFLEX MICROSCOPIC
Bilirubin Urine: NEGATIVE
Glucose, UA: NEGATIVE
Hgb urine dipstick: NEGATIVE
Leukocytes,Ua: NEGATIVE
Nitrite: NEGATIVE
Protein, ur: NEGATIVE
Specific Gravity, Urine: 1.026 (ref 1.001–1.03)
pH: 5.5 (ref 5.0–8.0)

## 2019-03-12 LAB — CBC WITH DIFFERENTIAL/PLATELET
Absolute Monocytes: 435 cells/uL (ref 200–950)
Basophils Absolute: 61 cells/uL (ref 0–200)
Basophils Relative: 1.1 %
Eosinophils Absolute: 88 cells/uL (ref 15–500)
Eosinophils Relative: 1.6 %
HCT: 42.8 % (ref 35.0–45.0)
Hemoglobin: 14.3 g/dL (ref 11.7–15.5)
Lymphs Abs: 1348 cells/uL (ref 850–3900)
MCH: 30.3 pg (ref 27.0–33.0)
MCHC: 33.4 g/dL (ref 32.0–36.0)
MCV: 90.7 fL (ref 80.0–100.0)
MPV: 9.5 fL (ref 7.5–12.5)
Monocytes Relative: 7.9 %
Neutro Abs: 3570 cells/uL (ref 1500–7800)
Neutrophils Relative %: 64.9 %
Platelets: 287 10*3/uL (ref 140–400)
RBC: 4.72 10*6/uL (ref 3.80–5.10)
RDW: 12.1 % (ref 11.0–15.0)
Total Lymphocyte: 24.5 %
WBC: 5.5 10*3/uL (ref 3.8–10.8)

## 2019-03-12 LAB — MICROALBUMIN / CREATININE URINE RATIO
Creatinine, Urine: 200 mg/dL (ref 20–275)
Microalb Creat Ratio: 2 mcg/mg creat (ref <30)
Microalb, Ur: 0.4 mg/dL

## 2019-03-12 LAB — COMPLETE METABOLIC PANEL WITH GFR
AG Ratio: 2.5 (calc) (ref 1.0–2.5)
ALT: 21 U/L (ref 6–29)
AST: 18 U/L (ref 10–35)
Albumin: 4.8 g/dL (ref 3.6–5.1)
Alkaline phosphatase (APISO): 90 U/L (ref 37–153)
BUN: 20 mg/dL (ref 7–25)
CO2: 29 mmol/L (ref 20–32)
Calcium: 10 mg/dL (ref 8.6–10.4)
Chloride: 102 mmol/L (ref 98–110)
Creat: 0.9 mg/dL (ref 0.50–0.99)
GFR, Est African American: 77 mL/min/{1.73_m2} (ref >=60)
GFR, Est Non African American: 66 mL/min/{1.73_m2} (ref >=60)
Globulin: 1.9 g/dL (calc) (ref 1.9–3.7)
Glucose, Bld: 104 mg/dL — ABNORMAL HIGH (ref 65–99)
Potassium: 4.8 mmol/L (ref 3.5–5.3)
Sodium: 140 mmol/L (ref 135–146)
Total Bilirubin: 0.4 mg/dL (ref 0.2–1.2)
Total Protein: 6.7 g/dL (ref 6.1–8.1)

## 2019-03-12 LAB — IRON, TOTAL/TOTAL IRON BINDING CAP
%SAT: 30 % (calc) (ref 16–45)
Iron: 131 ug/dL (ref 45–160)
TIBC: 437 mcg/dL (calc) (ref 250–450)

## 2019-03-12 LAB — LIPID PANEL
Cholesterol: 241 mg/dL — ABNORMAL HIGH (ref <200)
HDL: 59 mg/dL (ref >=50)
LDL Cholesterol (Calc): 150 mg/dL (calc) — ABNORMAL HIGH
Non-HDL Cholesterol (Calc): 182 mg/dL (calc) — ABNORMAL HIGH (ref <130)
Total CHOL/HDL Ratio: 4.1 (calc) (ref <5.0)
Triglycerides: 183 mg/dL — ABNORMAL HIGH (ref <150)

## 2019-03-12 LAB — TSH: TSH: 1.96 mIU/L (ref 0.40–4.50)

## 2019-03-12 LAB — VITAMIN B12: Vitamin B-12: 418 pg/mL (ref 200–1100)

## 2019-03-12 LAB — VITAMIN D 25 HYDROXY (VIT D DEFICIENCY, FRACTURES): Vit D, 25-Hydroxy: 59 ng/mL (ref 30–100)

## 2019-03-12 LAB — MAGNESIUM: Magnesium: 2.1 mg/dL (ref 1.5–2.5)

## 2019-03-12 LAB — AMYLASE: Amylase: 33 U/L (ref 21–101)

## 2019-03-14 ENCOUNTER — Other Ambulatory Visit: Payer: Self-pay

## 2019-03-14 ENCOUNTER — Encounter: Payer: Self-pay | Admitting: Gastroenterology

## 2019-03-14 ENCOUNTER — Ambulatory Visit: Payer: Medicare Other | Admitting: Gastroenterology

## 2019-03-14 ENCOUNTER — Other Ambulatory Visit (HOSPITAL_COMMUNITY): Payer: Self-pay

## 2019-03-14 VITALS — BP 114/64 | HR 92 | Temp 98.2°F | Ht 65.75 in | Wt 148.5 lb

## 2019-03-14 DIAGNOSIS — Z1159 Encounter for screening for other viral diseases: Secondary | ICD-10-CM

## 2019-03-14 DIAGNOSIS — R1310 Dysphagia, unspecified: Secondary | ICD-10-CM | POA: Diagnosis not present

## 2019-03-14 NOTE — Patient Instructions (Signed)
You have been scheduled for an endoscopy. Please follow written instructions given to you at your visit today. If you use inhalers (even only as needed), please bring them with you on the day of your procedure.   You have been scheduled for a modified barium swallow on 04/01/2019 at 1pm. Please arrive 15 minutes prior to your test for registration. You will go to [facility]  Radiology (1st Floor) for your appointment. Should you need to cancel or reschedule your appointment, please contact [phone] [facility] [place]) or [phone] [place]). _____________________________________________________________________ A Modified Barium Swallow Study, or MBS, is a special x-ray that is taken to check swallowing skills. It is carried out by a Stage manager and a Psychologist, clinical (SLP). During this test, yourmouth, throat, and esophagus, a muscular tube which connects your mouth to your stomach, is checked. The test will help you, your doctor, and the SLP plan what types of foods and liquids are easier for you to swallow. The SLP will also identify positions and ways to help you swallow more easily and safely. What will happen during an MBS? You will be taken to an x-ray room and seated comfortably. You will be asked to swallow small amounts of food and liquid mixed with barium. Barium is a liquid or paste that allows images of your mouth, throat and esophagus to be seen on x-ray. The x-ray captures moving images of the food you are swallowing as it travels from your mouth through your throat and into your esophagus. This test helps identify whether food or liquid is entering your lungs (aspiration). The test also shows which part of your mouth or throat lacks strength or coordination to move the food or liquid in the right direction. This test typically takes 30 minutes to 1 hour to complete.   Continue Omeprazole 40 mg 30 minutes before breakfast   Gastroesophageal Reflux Disease,  Adult Gastroesophageal reflux (GER) happens when acid from the stomach flows up into the tube that connects the mouth and the stomach (esophagus). Normally, food travels down the esophagus and stays in the stomach to be digested. However, when a person has GER, food and stomach acid sometimes move back up into the esophagus. If this becomes a more serious problem, the person may be diagnosed with a disease called gastroesophageal reflux disease (GERD). GERD occurs when the reflux:  Happens often.  Causes frequent or severe symptoms.  Causes problems such as damage to the esophagus. When stomach acid comes in contact with the esophagus, the acid may cause soreness (inflammation) in the esophagus. Over time, GERD may create small holes (ulcers) in the lining of the esophagus. What are the causes? This condition is caused by a problem with the muscle between the esophagus and the stomach (lower esophageal sphincter, or LES). Normally, the LES muscle closes after food passes through the esophagus to the stomach. When the LES is weakened or abnormal, it does not close properly, and that allows food and stomach acid to go back up into the esophagus. The LES can be weakened by certain dietary substances, medicines, and medical conditions, including:  Tobacco use.  Pregnancy.  Having a hiatal hernia.  Alcohol use.  Certain foods and beverages, such as coffee, chocolate, onions, and peppermint. What increases the risk? You are more likely to develop this condition if you:  Have an increased body weight.  Have a connective tissue disorder.  Use NSAID medicines. What are the signs or symptoms? Symptoms of this condition include:  Heartburn.  Difficult or painful swallowing.  The feeling of having a lump in the throat.  Abitter taste in the mouth.  Bad breath.  Having a large amount of saliva.  Having an upset or bloated stomach.  Belching.  Chest pain. Different conditions can  cause chest pain. Make sure you see your health care provider if you experience chest pain.  Shortness of breath or wheezing.  Ongoing (chronic) cough or a night-time cough.  Wearing away of tooth enamel.  Weight loss. How is this diagnosed? Your health care provider will take a medical history and perform a physical exam. To determine if you have mild or severe GERD, your health care provider may also monitor how you respond to treatment. You may also have tests, including:  A test to examine your stomach and esophagus with a small camera (endoscopy).  A test thatmeasures the acidity level in your esophagus.  A test thatmeasures how much pressure is on your esophagus.  A barium swallow or modified barium swallow test to show the shape, size, and functioning of your esophagus. How is this treated? The goal of treatment is to help relieve your symptoms and to prevent complications. Treatment for this condition may vary depending on how severe your symptoms are. Your health care provider may recommend:  Changes to your diet.  Medicine.  Surgery. Follow these instructions at home: Eating and drinking   Follow a diet as recommended by your health care provider. This may involve avoiding foods and drinks such as: ? Coffee and tea (with or without caffeine). ? Drinks that containalcohol. ? Energy drinks and sports drinks. ? Carbonated drinks or sodas. ? Chocolate and cocoa. ? Peppermint and mint flavorings. ? Garlic and onions. ? Horseradish. ? Spicy and acidic foods, including peppers, chili powder, curry powder, vinegar, hot sauces, and barbecue sauce. ? Citrus fruit juices and citrus fruits, such as oranges, lemons, and limes. ? Tomato-based foods, such as red sauce, chili, salsa, and pizza with red sauce. ? Fried and fatty foods, such as donuts, french fries, potato chips, and high-fat dressings. ? High-fat meats, such as hot dogs and fatty cuts of red and white meats,  such as rib eye steak, sausage, ham, and bacon. ? High-fat dairy items, such as whole milk, butter, and cream cheese.  Eat small, frequent meals instead of large meals.  Avoid drinking large amounts of liquid with your meals.  Avoid eating meals during the 2-3 hours before bedtime.  Avoid lying down right after you eat.  Do not exercise right after you eat. Lifestyle   Do not use any products that contain nicotine or tobacco, such as cigarettes, e-cigarettes, and chewing tobacco. If you need help quitting, ask your health care provider.  Try to reduce your stress by using methods such as yoga or meditation. If you need help reducing stress, ask your health care provider.  If you are overweight, reduce your weight to an amount that is healthy for you. Ask your health care provider for guidance about a safe weight loss goal. General instructions  Pay attention to any changes in your symptoms.  Take over-the-counter and prescription medicines only as told by your health care provider. Do not take aspirin, ibuprofen, or other NSAIDs unless your health care provider told you to do so.  Wear loose-fitting clothing. Do not wear anything tight around your waist that causes pressure on your abdomen.  Raise (elevate) the head of your bed about 6 inches (15 cm).  Avoid bending over  if this makes your symptoms worse.  Keep all follow-up visits as told by your health care provider. This is important. Contact a health care provider if:  You have: ? New symptoms. ? Unexplained weight loss. ? Difficulty swallowing or it hurts to swallow. ? Wheezing or a persistent cough. ? A hoarse voice.  Your symptoms do not improve with treatment. Get help right away if you:  Have pain in your arms, neck, jaw, teeth, or back.  Feel sweaty, dizzy, or light-headed.  Have chest pain or shortness of breath.  Vomit and your vomit looks like blood or coffee grounds.  Faint.  Have stool that is  bloody or black.  Cannot swallow, drink, or eat. Summary  Gastroesophageal reflux happens when acid from the stomach flows up into the esophagus. GERD is a disease in which the reflux happens often, causes frequent or severe symptoms, or causes problems such as damage to the esophagus.  Treatment for this condition may vary depending on how severe your symptoms are. Your health care provider may recommend diet and lifestyle changes, medicine, or surgery.  Contact a health care provider if you have new or worsening symptoms.  Take over-the-counter and prescription medicines only as told by your health care provider. Do not take aspirin, ibuprofen, or other NSAIDs unless your health care provider told you to do so.  Keep all follow-up visits as told by your health care provider. This is important. This information is not intended to replace advice given to you by your health care provider. Make sure you discuss any questions you have with your health care provider. Document Revised: 08/29/2017 Document Reviewed: 08/29/2017 Elsevier Patient Education  [place].    I appreciate the  opportunity to care for you  Thank You   Harl Bowie , MD  _______________________________________________________________________

## 2019-03-14 NOTE — Progress Notes (Signed)
Christine [name]    IQ:[id number]    [birth year]  Primary Care Physician:Collier, Randolm Idol  Referring Physician: Unk Pinto, MD [address] [place],   [address]   Chief complaint:  Dysphagia  HPI: 69 year old female with history of multinodular goiter with complaints of worsening dysphagia in the past 6 months.  About 6 months ago she was having difficulty swallowing saliva especially when she was laying down in bed  Is also noticing difficulty swallowing with food and pills  Nov 2020, took a bite wouldn't go down, she had to cough and regurgitate it back up.  Has not had any other episodes of food impaction since then  Denies any increase in neck swelling or new thyroid nodules with goiter.  No breathing difficulty  No family history of GI malignancy  Colonoscopy February 05, 2015: Normal  Never had EGD  Denies any nausea, vomiting, abdominal pain, melena or bright red blood per rectum    Outpatient Encounter Medications as of 03/14/2019  Medication Sig  . amLODipine (NORVASC) 2.5 MG tablet Take 1 tablet Daily for BP  . Ascorbic Acid (VITA-C PO) Take by mouth.  Marland Kitchen aspirin EC 81 MG tablet Take 81 mg by mouth daily.  . B Complex Vitamins (VITAMIN B COMPLEX PO) Take by mouth.  . Cetirizine HCl (ZYRTEC ALLERGY PO) Take by mouth as needed.   . Cholecalciferol (VITAMIN D PO) Take 5,000 Units by mouth daily.  . COD LIVER OIL PO Take by mouth daily.  . Coenzyme Q10 100 MG TABS Take by mouth daily.  . diphenhydrAMINE (BENADRYL) 25 MG tablet Take 25 mg by mouth as needed.   Marland Kitchen estradiol (ESTRACE) 0.1 MG/GM vaginal cream daily as needed.  . Estradiol 10 MCG TABS vaginal tablet INSERT 1 TABLET VAGINALLY  TWICE WEEKLY  . fexofenadine (ALLEGRA) 180 MG tablet Take 180 mg by mouth daily. Alternating with Zyrtec  . GLUCOSAMINE-CHONDROITIN PO Take 1,500 Units by mouth daily.   Marland Kitchen MAGNESIUM PO Take 800 Units by mouth daily.   Marland Kitchen MELATONIN PO Take  by mouth as needed. Reported on 08/13/2015  . Multiple Vitamins-Minerals (HAIR/SKIN/NAILS/BIOTIN) TABS Take 3 tablets by mouth daily.  . Multiple Vitamins-Minerals (ZINC PO) Take by mouth.  . Omega-3 Fatty Acids (FISH OIL PO) Take 1,200 mg by mouth.   Marland Kitchen omeprazole (PRILOSEC) 40 MG capsule Take 1 capsule (40 mg total) by mouth daily.  . Probiotic Product (PROBIOTIC DAILY PO) Take by mouth daily.  . rizatriptan (MAXALT) 10 MG tablet Take 1 tablet (10 mg total) by mouth once as needed for migraine. May repeat in 2 hours if needed  . rosuvastatin (CRESTOR) 40 MG tablet TAKE 1 TABLET BY MOUTH  DAILY FOR CHOLESTEROL (Patient taking differently: 1/2 tablet Mon,  Wed, Fri)  . spironolactone (ALDACTONE) 100 MG tablet TAKE 2 TABLETS BY MOUTH  DAILY  . Turmeric 500 MG TABS Take 1 tablet by mouth daily.  . valACYclovir (VALTREX) 1000 MG tablet TAKE 1 TABLET BY MOUTH  DAILY  . [DISCONTINUED] Clindamycin-Benzoyl Per, Refr, gel USE AS DIRECTED TWICE A DAY AS SPOT TREATMENT EXTERNALLY 30 DAYS  . [DISCONTINUED] Multiple Vitamins-Minerals (HAIR/SKIN/NAILS PO) Take 3 tablets by mouth daily.   No facility-administered encounter medications on file as of 03/14/2019.    Allergies as of 03/14/2019 - Review Complete 03/14/2019  Allergen Reaction Noted  . Bactrim [sulfamethoxazole-trimethoprim] Itching 07/18/2002  . Lipitor [atorvastatin] Other (See Comments) 03/13/2013  . Monistat [miconazole]  Itching 07/18/2002    Past Medical History:  Diagnosis Date  . AC (acromioclavicular) joint bone spurs   . Anxiety   . Degenerative arthritis   . Depression   . GERD (gastroesophageal reflux disease)   . Goiter    multi nodular   . History of anemia   . History of PCOS   . Hyperlipidemia   . Hypertension   . Nevus of left eye   . Nuclear cataract 06/13/2011  . PCOS (polycystic ovarian syndrome)   . Status post intraocular lens implant 06/13/2011    Past Surgical History:  Procedure Laterality Date  . BUNIONECTOMY  Bilateral 2006   Feet  . HERNIA REPAIR Right 1959  . TOE SURGERY Left 2006   Bone Spur  . TONSILLECTOMY AND ADENOIDECTOMY  1959  . TUBAL LIGATION  1995    Family History  Problem Relation Age of Onset  . Thyroid disease Mother        hyperthyroidism  . Hypertension Mother   . Hyperlipidemia Mother   . Heart disease Mother 85  . Atrial fibrillation Mother   . Dementia Mother   . Heart disease Father   . Lung cancer Father   . Liver cancer Father   . Cancer Brother 35       sarcoma  . Hyperlipidemia Brother   . Heart disease Brother 68  . Stroke Paternal Grandmother   . Emphysema Maternal Grandfather   . Thyroid disease Daughter   . Other Daughter        gluten sensetivity  . Polycystic ovary syndrome Daughter   . Colon cancer Neg Hx   . Esophageal cancer Neg Hx   . Rectal cancer Neg Hx   . Stomach cancer Neg Hx     Social History   Socioeconomic History  . Marital status: Married    Spouse name: Not on file  . Number of children: 3  . Years of education: Not on file  . Highest education level: Not on file  Occupational History  . Occupation: retired  Tobacco Use  . Smoking status: Never Smoker  . Smokeless tobacco: Never Used  Substance and Sexual Activity  . Alcohol use: Yes    Alcohol/week: 0.0 - 1.0 standard drinks  . Drug use: No  . Sexual activity: Yes    Partners: Male    Birth control/protection: Surgical, Post-menopausal    Comment: BTL  Other Topics Concern  . Not on file  Social History Narrative  . Not on file   Social Determinants of Health   Financial Resource Strain:   . Difficulty of Paying Living Expenses: Not on file  Food Insecurity:   . Worried About Charity fundraiser in the Last Year: Not on file  . Ran Out of Food in the Last Year: Not on file  Transportation Needs:   . Lack of Transportation (Medical): Not on file  . Lack of Transportation (Non-Medical): Not on file  Physical Activity:   . Days of Exercise per Week: Not  on file  . Minutes of Exercise per Session: Not on file  Stress:   . Feeling of Stress : Not on file  Social Connections:   . Frequency of Communication with Friends and Family: Not on file  . Frequency of Social Gatherings with Friends and Family: Not on file  . Attends Religious Services: Not on file  . Active Member of Clubs or Organizations: Not on file  . Attends Archivist Meetings: Not  on file  . Marital Status: Not on file  Intimate Partner Violence:   . Fear of Current or Ex-Partner: Not on file  . Emotionally Abused: Not on file  . Physically Abused: Not on file  . Sexually Abused: Not on file      Review of systems: Review of Systems  Constitutional: Negative for fever and chills.  HENT: Sinus trouble Eyes: Negative for blurred vision.  Respiratory: Negative for cough, shortness of breath and wheezing.   Cardiovascular: Negative for chest pain and palpitations.  Gastrointestinal: as per HPI Genitourinary: Negative for dysuria, urgency, frequency and hematuria.  Musculoskeletal: Positive for myalgias, back pain and joint pain.  Skin: Negative for itching and rash.  Neurological: Negative for dizziness, tremors, focal weakness, seizures and loss of consciousness. Positive for headache Endo/Heme/Allergies: Positive for seasonal allergies.  Psychiatric/Behavioral: Negative for depression, suicidal ideas and hallucinations.  All other systems reviewed and are negative.   Physical Exam: Vitals:   03/14/19 1048  BP: 114/64  Pulse: 92  Temp: 98.2 F (36.8 C)   Body mass index is 24.15 kg/m. Gen:      No acute distress HEENT:  EOMI, sclera anicteric Neck:     No masses; no thyromegaly Lungs:    Clear to auscultation bilaterally; normal respiratory effort CV:         Regular rate and rhythm; no murmurs Abd:      + bowel sounds; soft, non-tender; no palpable masses, no distension Ext:    No edema; adequate peripheral perfusion Skin:      Warm and dry;  no rash Neuro: alert and oriented x 3 Psych: normal mood and affect  Data Reviewed:  Reviewed labs, radiology imaging, old records and pertinent past GI work up   Assessment and Plan/Recommendations:  69 year old female with history of hypertension, hyperlipidemia and multinodular goiter with complaints of worsening dysphagia  We will schedule for EGD to exclude peptic stricture, eosinophilic esophagitis or neoplastic lesion.  Plan for esophageal biopsies and dilation if needed The risks and benefits as well as alternatives of endoscopic procedure(s) have been discussed and reviewed. All questions answered. The patient agrees to proceed.  Modified barium swallow to evaluate for possible oropharyngeal dysphagia in the setting of multinodular goiter  GERD: Continue omeprazole Antireflux measures  Return after EGD and barium swallow   K. Denzil Magnuson , MD    CC: Unk Pinto, MD

## 2019-03-17 ENCOUNTER — Encounter: Payer: Self-pay | Admitting: Gastroenterology

## 2019-03-19 ENCOUNTER — Other Ambulatory Visit: Payer: Self-pay | Admitting: Gastroenterology

## 2019-03-19 ENCOUNTER — Ambulatory Visit (INDEPENDENT_AMBULATORY_CARE_PROVIDER_SITE_OTHER): Payer: Medicare Other

## 2019-03-19 DIAGNOSIS — Z1159 Encounter for screening for other viral diseases: Secondary | ICD-10-CM

## 2019-03-19 LAB — SARS CORONAVIRUS 2 (TAT 6-24 HRS): SARS Coronavirus 2: NEGATIVE

## 2019-03-21 ENCOUNTER — Ambulatory Visit (AMBULATORY_SURGERY_CENTER): Payer: Medicare Other | Admitting: Gastroenterology

## 2019-03-21 ENCOUNTER — Other Ambulatory Visit: Payer: Self-pay

## 2019-03-21 ENCOUNTER — Other Ambulatory Visit: Payer: Self-pay | Admitting: Gastroenterology

## 2019-03-21 ENCOUNTER — Encounter: Payer: Self-pay | Admitting: Gastroenterology

## 2019-03-21 VITALS — BP 133/71 | HR 63 | Temp 98.2°F | Resp 11 | Ht 65.0 in | Wt 148.0 lb

## 2019-03-21 DIAGNOSIS — K2950 Unspecified chronic gastritis without bleeding: Secondary | ICD-10-CM | POA: Diagnosis not present

## 2019-03-21 DIAGNOSIS — R1310 Dysphagia, unspecified: Secondary | ICD-10-CM | POA: Diagnosis not present

## 2019-03-21 MED ORDER — SODIUM CHLORIDE 0.9 % IV SOLN: 500.0000 mL | Freq: Once | INTRAVENOUS | Status: DC

## 2019-03-21 NOTE — Progress Notes (Signed)
Pt tolerated well. VSS. Awake and to recdovery. Bite block out without trauma.

## 2019-03-21 NOTE — Patient Instructions (Signed)
Handout given: Post dilation diet Continue present medications Await pathology results Return to Dr. Jillyn Hidden office at the next available appt    YOU HAD AN ENDOSCOPIC PROCEDURE TODAY AT [facility]:   Refer to the procedure report that was given to you for any specific questions about what was found during the examination.  If the procedure report does not answer your questions, please call your gastroenterologist to clarify.  If you requested that your care partner not be given the details of your procedure findings, then the procedure report has been included in a sealed envelope for you to review at your convenience later.  YOU SHOULD EXPECT: Some feelings of bloating in the abdomen. Passage of more gas than usual.  Walking can help get rid of the air that was put into your GI tract during the procedure and reduce the bloating. If you had a lower endoscopy (such as a colonoscopy or flexible sigmoidoscopy) you may notice spotting of blood in your stool or on the toilet paper. If you underwent a bowel prep for your procedure, you may not have a normal bowel movement for a few days.  Please Note:  You might notice some irritation and congestion in your nose or some drainage.  This is from the oxygen used during your procedure.  There is no need for concern and it should clear up in a day or so.  SYMPTOMS TO REPORT IMMEDIATELY:    Following upper endoscopy (EGD)  Vomiting of blood or coffee ground material  New chest pain or pain under the shoulder blades  Painful or persistently difficult swallowing  New shortness of breath  Fever of 100F or higher  Black, tarry-looking stools  For urgent or emergent issues, a gastroenterologist can be reached at any hour by calling [phone].   DIET:  We do recommend a small meal at first, but then you may proceed to your regular diet.  Drink plenty of fluids but you should avoid alcoholic beverages for 24 hours.  ACTIVITY:   You should plan to take it easy for the rest of today and you should NOT DRIVE or use heavy machinery until tomorrow (because of the sedation medicines used during the test).    FOLLOW UP: Our staff will call the number listed on your records 48-72 hours following your procedure to check on you and address any questions or concerns that you may have regarding the information given to you following your procedure. If we do not reach you, we will leave a message.  We will attempt to reach you two times.  During this call, we will ask if you have developed any symptoms of COVID 19. If you develop any symptoms (ie: fever, flu-like symptoms, shortness of breath, cough etc.) before then, please call [phone].  If you test positive for Covid 19 in the 2 weeks post procedure, please call and report this information to [place].    If any biopsies were taken you will be contacted by phone or by letter within the next 1-3 weeks.  Please call us at [phone] if you have not heard about the biopsies in 3 weeks.    SIGNATURES/CONFIDENTIALITY: You and/or your care partner have signed paperwork which will be entered into your electronic medical record.  These signatures attest to the fact that that the information above on your After Visit Summary has been reviewed and is understood.  Full responsibility of the confidentiality of this discharge information lies with you and/or  your care-partner. 

## 2019-03-21 NOTE — Progress Notes (Signed)
Temperature- Christine [name] to room to assist during endoscopic procedure.  Patient ID and intended procedure confirmed with present staff. Received instructions for my participation in the procedure from the performing physician.

## 2019-03-21 NOTE — Progress Notes (Signed)
Pt tolerated well. VSS. Bite block out wqithout trauma. Awake and to recovery.

## 2019-03-21 NOTE — Op Note (Signed)
[place] Patient Name: Christine [name] Procedure Date: 03/21/2019 1:26 PM MRN: [id number] Endoscopist: Mauri Pole , MD Age: 69 Referring MD:  Date of Birth: Aug 03, 1950 Gender: Female Account #: [id number] Procedure:                Upper GI endoscopy Indications:              Dysphagia Medicines:                Monitored Anesthesia Care Procedure:                Pre-Anesthesia Assessment:                           - Prior to the procedure, a History and Physical                            was performed, and patient medications and                            allergies were reviewed. The patient's tolerance of                            previous anesthesia was also reviewed. The risks                            and benefits of the procedure and the sedation                            options and risks were discussed with the patient.                            All questions were answered, and informed consent                            was obtained. Prior Anticoagulants: The patient has                            taken no previous anticoagulant or antiplatelet                            agents. ASA Grade Assessment: II - A patient with                            mild systemic disease. After reviewing the risks                            and benefits, the patient was deemed in                            satisfactory condition to undergo the procedure.                           After obtaining informed consent, the endoscope was  passed under direct vision. Throughout the                            procedure, the patient's blood pressure, pulse, and                            oxygen saturations were monitored continuously. The                            Endoscope was introduced through the mouth, and                            advanced to the second part of duodenum. The upper                            GI endoscopy was accomplished without  difficulty.                            The patient tolerated the procedure well. Scope In: Scope Out: Findings:                 No endoscopic abnormality was evident in the                            esophagus to explain the patient's complaint of                            dysphagia. It was decided, however, to proceed with                            dilation of the lower third of the esophagus. A TTS                            dilator was passed through the scope. Dilation with                            an 18-19-20 mm balloon dilator was performed to 20                            mm. The dilation site was examined following                            endoscope reinsertion and showed no change.                            Biopsies were obtained from the proximal and distal                            esophagus with cold forceps for histology of                            suspected eosinophilic esophagitis.  Patchy mild inflammation characterized by                            congestion (edema), erosions, erythema and mucus                            was found in the gastric body, in the gastric                            antrum and in the prepyloric region of the stomach.                            Biopsies were taken with a cold forceps for                            Helicobacter pylori testing.                           The examined duodenum was normal. Complications:            No immediate complications. Estimated Blood Loss:     Estimated blood loss was minimal. Impression:               - No endoscopic esophageal abnormality to explain                            patient's dysphagia. Esophagus dilated. Dilated.                            Biopsied.                           - Gastritis. Biopsied.                           - Normal examined duodenum. Recommendation:           - Patient has a contact number available for                            emergencies.  The signs and symptoms of potential                            delayed complications were discussed with the                            patient. Return to normal activities tomorrow.                            Written discharge instructions were provided to the                            patient.                           - Resume previous diet.                           -  Continue present medications.                           - Await pathology results.                           - Return to my office at the next available                            appointment. Mauri Pole, MD 03/21/2019 1:58:00 PM This report has been signed electronically.

## 2019-03-25 ENCOUNTER — Telehealth: Payer: Self-pay

## 2019-03-25 NOTE — Telephone Encounter (Signed)
  Follow up Call-  Call back number 03/21/2019  Post procedure Call Back phone  # [phone]  Permission to leave phone message Yes  Some recent data might be hidden     Patient questions:  Do you have a fever, pain , or abdominal swelling? No. Pain Score  0 *  Have you tolerated food without any problems? Yes.    Have you been able to return to your normal activities? Yes.    Do you have any questions about your discharge instructions: Diet   No. Medications  No. Follow up visit  No.  Do you have questions or concerns about your Care? No.  Actions: * If pain score is 4 or above: No action needed, pain <4.  1. Have you developed a fever since your procedure? no  2.   Have you had an respiratory symptoms (SOB or cough) since your procedure?n o  3.   Have you tested positive for COVID 19 since your procedure no  4.   Have you had any family members/close contacts diagnosed with the COVID 19 since your procedure?  no   If yes to any of these questions please route to Joylene John, RN and Alphonsa Gin, Therapist, sports.

## 2019-04-01 ENCOUNTER — Ambulatory Visit (HOSPITAL_COMMUNITY)
Admission: RE | Admit: 2019-04-01 | Discharge: 2019-04-01 | Disposition: A | Discharge status: Home / Self Care | Payer: Medicare Other | Source: Ambulatory Visit | Attending: Gastroenterology | Admitting: Gastroenterology

## 2019-04-01 ENCOUNTER — Encounter: Payer: Self-pay | Admitting: Gastroenterology

## 2019-04-01 ENCOUNTER — Other Ambulatory Visit: Payer: Self-pay

## 2019-04-01 DIAGNOSIS — R1310 Dysphagia, unspecified: Secondary | ICD-10-CM

## 2019-04-07 ENCOUNTER — Encounter: Payer: Self-pay | Admitting: Gastroenterology

## 2019-04-07 ENCOUNTER — Ambulatory Visit: Payer: Medicare Other | Admitting: Gastroenterology

## 2019-04-07 VITALS — BP 110/70 | HR 64 | Temp 97.7°F | Ht 65.0 in | Wt 144.2 lb

## 2019-04-07 DIAGNOSIS — R1319 Other dysphagia: Secondary | ICD-10-CM

## 2019-04-07 DIAGNOSIS — R1310 Dysphagia, unspecified: Secondary | ICD-10-CM

## 2019-04-07 NOTE — Progress Notes (Signed)
Christine [name]    [id number]    [birth year]  Primary Care Physician:Collier, Randolm Idol  Referring Physician: Vicie Mutters, PA-C [address] [place],  [place] [address]   Chief complaint: Dysphagia  HPI:  69 year old female here for follow-up visit for dysphagia  EGD: Normal esophaus, TTS dilator 57mm, esophageal biopsies negative for EOE and gastric biopsies negative for H.pylori.   Continues to have difficulty swallowing with globus sensation, has to take multiple sips for food to go down.  She is no longer belching like she was doing before  She stopped taking omeprazole as it is not helping.  Abdominal pain, vomiting, change in bowel habits, melena or blood per rectum.  Colonoscopy: Feb 05, 2015: Normal  Outpatient Encounter Medications as of 04/07/2019  Medication Sig  . amLODipine (NORVASC) 2.5 MG tablet Take 1 tablet Daily for BP  . aspirin EC 81 MG tablet Take 81 mg by mouth daily.  . B Complex Vitamins (VITAMIN B COMPLEX PO) Take by mouth.  . Cetirizine HCl (ZYRTEC ALLERGY PO) Take by mouth as needed.   . Cholecalciferol (VITAMIN D PO) Take 10,000 Units by mouth daily.   . COD LIVER OIL PO Take by mouth daily.  . Coenzyme Q10 100 MG TABS Take by mouth daily.  . diphenhydrAMINE (BENADRYL) 25 MG tablet Take 25 mg by mouth as needed.   Marland Kitchen estradiol (ESTRACE) 0.1 MG/GM vaginal cream daily as needed.  . Estradiol 10 MCG TABS vaginal tablet INSERT 1 TABLET VAGINALLY  TWICE WEEKLY  . fexofenadine (ALLEGRA) 180 MG tablet Take 180 mg by mouth daily. Alternating with Zyrtec  . GLUCOSAMINE-CHONDROITIN PO Take 1,500 Units by mouth daily.   Marland Kitchen MAGNESIUM PO Take 800 Units by mouth daily.   Marland Kitchen MELATONIN PO Take by mouth as needed. Reported on 08/13/2015  . Multiple Vitamins-Minerals (HAIR/SKIN/NAILS/BIOTIN) TABS Take 3 tablets by mouth daily.  . Omega-3 Fatty Acids (FISH OIL PO) Take 1,200 mg by mouth.   . Probiotic Product (PROBIOTIC DAILY PO) Take  by mouth daily.  . rizatriptan (MAXALT) 10 MG tablet Take 1 tablet (10 mg total) by mouth once as needed for migraine. May repeat in 2 hours if needed  . rosuvastatin (CRESTOR) 40 MG tablet TAKE 1 TABLET BY MOUTH  DAILY FOR CHOLESTEROL (Patient taking differently: 1/2 tablet Mon,  Wed, Fri)  . spironolactone (ALDACTONE) 100 MG tablet TAKE 2 TABLETS BY MOUTH  DAILY  . valACYclovir (VALTREX) 1000 MG tablet TAKE 1 TABLET BY MOUTH  DAILY  . zinc gluconate 50 MG tablet Take 50 mg by mouth daily.  Marland Kitchen omeprazole (PRILOSEC) 40 MG capsule Take 1 capsule (40 mg total) by mouth daily. (Patient not taking: Reported on 04/07/2019)  . [DISCONTINUED] Ascorbic Acid (VITA-C PO) Take by mouth.  . [DISCONTINUED] Multiple Vitamins-Minerals (ZINC PO) Take by mouth.  . [DISCONTINUED] Turmeric 500 MG TABS Take 1 tablet by mouth daily.   No facility-administered encounter medications on file as of 04/07/2019.    Allergies as of 04/07/2019 - Review Complete 04/07/2019  Allergen Reaction Noted  . Bactrim [sulfamethoxazole-trimethoprim] Itching 07/18/2002  . Lipitor [atorvastatin] Other (See Comments) 03/13/2013  . Monistat [miconazole] Itching 07/18/2002    Past Medical History:  Diagnosis Date  . AC (acromioclavicular) joint bone spurs   . Allergy   . Anemia   . Anxiety   . Degenerative arthritis   . Depression   . GERD (gastroesophageal reflux disease)   .  Goiter    multi nodular   . History of anemia   . History of PCOS   . Hyperlipidemia   . Hypertension   . Nevus of left eye   . Nuclear cataract 06/13/2011  . PCOS (polycystic ovarian syndrome)   . Status post intraocular lens implant 06/13/2011    Past Surgical History:  Procedure Laterality Date  . BUNIONECTOMY Bilateral 2006   Feet  . CATARACT EXTRACTION Left   . COLONOSCOPY    . HERNIA REPAIR Right 1959  . TOE SURGERY Left 2006   Bone Spur  . TONSILLECTOMY AND ADENOIDECTOMY  1959  . TUBAL LIGATION  1995    Family History  Problem  Relation Age of Onset  . Thyroid disease Mother        hyperthyroidism  . Hypertension Mother   . Hyperlipidemia Mother   . Heart disease Mother 42  . Atrial fibrillation Mother   . Dementia Mother   . Heart disease Father   . Lung cancer Father   . Liver cancer Father   . Cancer Brother 68       sarcoma  . Hyperlipidemia Brother   . Heart disease Brother 90  . Stroke Paternal Grandmother   . Emphysema Maternal Grandfather   . Thyroid disease Daughter   . Other Daughter        gluten sensetivity  . Polycystic ovary syndrome Daughter   . Colon cancer Neg Hx   . Esophageal cancer Neg Hx   . Rectal cancer Neg Hx   . Stomach cancer Neg Hx     Social History   Socioeconomic History  . Marital status: Married    Spouse name: Not on file  . Number of children: 3  . Years of education: Not on file  . Highest education level: Not on file  Occupational History  . Occupation: retired  Tobacco Use  . Smoking status: Never Smoker  . Smokeless tobacco: Never Used  Substance and Sexual Activity  . Alcohol use: Yes    Alcohol/week: 0.0 - 1.0 standard drinks  . Drug use: No  . Sexual activity: Yes    Partners: Male    Birth control/protection: Surgical, Post-menopausal    Comment: BTL  Other Topics Concern  . Not on file  Social History Narrative  . Not on file   Social Determinants of Health   Financial Resource Strain:   . Difficulty of Paying Living Expenses: Not on file  Food Insecurity:   . Worried About Charity fundraiser in the Last Year: Not on file  . Ran Out of Food in the Last Year: Not on file  Transportation Needs:   . Lack of Transportation (Medical): Not on file  . Lack of Transportation (Non-Medical): Not on file  Physical Activity:   . Days of Exercise per Week: Not on file  . Minutes of Exercise per Session: Not on file  Stress:   . Feeling of Stress : Not on file  Social Connections:   . Frequency of Communication with Friends and Family: Not on  file  . Frequency of Social Gatherings with Friends and Family: Not on file  . Attends Religious Services: Not on file  . Active Member of Clubs or Organizations: Not on file  . Attends Archivist Meetings: Not on file  . Marital Status: Not on file  Intimate Partner Violence:   . Fear of Current or Ex-Partner: Not on file  . Emotionally Abused: Not on  file  . Physically Abused: Not on file  . Sexually Abused: Not on file      Review of systems:  All other review of systems negative except as mentioned in the HPI.   Physical Exam: Vitals:   04/07/19 0828  BP: 110/70  Pulse: 64  Temp: 97.7 F (36.5 C)   Body mass index is 24 kg/m. Gen:      No acute distress Neuro: alert and oriented x 3 Psych: normal mood and affect  Data Reviewed:  Reviewed labs, radiology imaging, old records and pertinent past GI work up   Assessment and Plan/Recommendations:  69 year old female here with complaints of persistent dysphagia  Schedule for esophageal manometry and 24-hour pH impedance off PPI for further evaluation  Continue antireflux measures  The risks and benefits as well as alternatives of  procedure(s) have been discussed and reviewed. All questions answered. The patient agrees to proceed.    The patient was provided an opportunity to ask questions and all were answered. The patient agreed with the plan and demonstrated an understanding of the instructions.  Damaris Hippo , MD    CC: Vicie Mutters, PA-C

## 2019-04-07 NOTE — Patient Instructions (Addendum)
You have been scheduled for an esophageal manometry at [facility] on 04/16/2019 at 12:30PM. Please arrive 30 minutes prior to your procedure for registration. You will need to go to outpatient registration (1st floor of the hospital) first. Make certain to bring your insurance cards as well as a complete list of medications.  Please remember the following:  STAY OFF PPI   1) Do not take any muscle relaxants, xanax (alprazolam) or ativan for 1 day prior to your test as well as the day of the test.  2) Nothing to eat or drink for 4 hours before your test.  3) Hold all diabetic medications/insulin the morning of the test. You may eat and take your medications after the test.  It will take at least 2 weeks to receive the results of this test from your physician. ------------------------------------------ ABOUT ESOPHAGEAL MANOMETRY Esophageal manometry (muh-NOM-uh-tree) is a test that gauges how well your esophagus works. Your esophagus is the long, muscular tube that connects your throat to your stomach. Esophageal manometry measures the rhythmic muscle contractions (peristalsis) that occur in your esophagus when you swallow. Esophageal manometry also measures the coordination and force exerted by the muscles of your esophagus.  During esophageal manometry, a thin, flexible tube (catheter) that contains sensors is passed through your nose, down your esophagus and into your stomach. Esophageal manometry can be helpful in diagnosing some mostly uncommon disorders that affect your esophagus.  Why it's done Esophageal manometry is used to evaluate the movement (motility) of food through the esophagus and into the stomach. The test measures how well the circular bands of muscle (sphincters) at the top and bottom of your esophagus open and close, as well as the pressure, strength and pattern of the wave of esophageal muscle contractions that moves food along.  What you can expect Esophageal  manometry is an outpatient procedure done without sedation. Most people tolerate it well. You may be asked to change into a hospital gown before the test starts.  During esophageal manometry  . While you are sitting up, a member of your health care team sprays your throat with a numbing medication or puts numbing gel in your nose or both.  . A catheter is guided through your nose into your esophagus. The catheter may be sheathed in a water-filled sleeve. It doesn't interfere with your breathing. However, your eyes may water, and you may gag. You may have a slight nosebleed from irritation.  . After the catheter is in place, you may be asked to lie on your back on an exam table, or you may be asked to remain seated.  . You then swallow small sips of water. As you do, a computer connected to the catheter records the pressure, strength and pattern of your esophageal muscle contractions.  . During the test, you'll be asked to breathe slowly and smoothly, remain as still as possible, and swallow only when you're asked to do so.  . A member of your health care team may move the catheter down into your stomach while the catheter continues its measurements.  . The catheter then is slowly withdrawn. The test usually lasts 20 to 30 minutes.  After esophageal manometry  When your esophageal manometry is complete, you may return to your normal activities  This test typically takes 30-45 minutes to complete.  Due to recent COVID-19 restrictions implemented by our local and state authorities and in an effort to keep both patients and staff as safe as possible, our hospital  system now requires COVID-19 testing prior to any scheduled hospital procedure. Please go to our [facility] location drive thru testing site ([address] [place], [place] [address]) on 2/5 at  [place]. There will be multiple testing areas, the first checkpoint being for pre-procedure/surgery testing. Get into the right (yellow) lane that  leads to the PAT testing team. You will not be billed at the time of testing but may receive a bill later depending on your insurance. The approximate cost of the test is $100. You must agree to quarantine from the time of your testing until the procedure date on 2/10 . This should include staying at home with ONLY the people you live with. Avoid take-out, grocery store shopping or leaving the house for any non-emergent reason. Failure to have your COVID-19 test done on the date and time you have been scheduled will result in cancellation of procedure. Please call our office at [phone] if you have any questions.  ________________________________________________________________________________

## 2019-04-10 ENCOUNTER — Encounter: Payer: Self-pay | Admitting: Gastroenterology

## 2019-04-10 ENCOUNTER — Other Ambulatory Visit: Payer: Self-pay | Admitting: Obstetrics & Gynecology

## 2019-04-10 DIAGNOSIS — Z1231 Encounter for screening mammogram for malignant neoplasm of breast: Secondary | ICD-10-CM

## 2019-04-11 ENCOUNTER — Other Ambulatory Visit (HOSPITAL_COMMUNITY): Payer: Medicare Other

## 2019-04-11 ENCOUNTER — Inpatient Hospital Stay (HOSPITAL_COMMUNITY): Admission: RE | Admit: 2019-04-11 | Payer: Medicare Other | Source: Ambulatory Visit

## 2019-04-12 ENCOUNTER — Other Ambulatory Visit (HOSPITAL_COMMUNITY)
Admission: RE | Admit: 2019-04-12 | Discharge: 2019-04-12 | Disposition: A | Discharge status: Home / Self Care | Payer: Medicare Other | Source: Ambulatory Visit | Attending: Gastroenterology | Admitting: Gastroenterology

## 2019-04-12 DIAGNOSIS — Z20822 Contact with and (suspected) exposure to covid-19: Secondary | ICD-10-CM | POA: Diagnosis not present

## 2019-04-12 DIAGNOSIS — Z01812 Encounter for preprocedural laboratory examination: Secondary | ICD-10-CM | POA: Diagnosis present

## 2019-04-12 LAB — SARS CORONAVIRUS 2 (TAT 6-24 HRS): SARS Coronavirus 2: NEGATIVE

## 2019-04-16 ENCOUNTER — Ambulatory Visit (HOSPITAL_COMMUNITY)
Admission: RE | Admit: 2019-04-16 | Discharge: 2019-04-16 | Disposition: A | Discharge status: Home / Self Care | Payer: Medicare Other | Attending: Gastroenterology | Admitting: Gastroenterology

## 2019-04-16 ENCOUNTER — Encounter (HOSPITAL_COMMUNITY)
Admission: RE | Disposition: A | Discharge status: Home / Self Care | Payer: Self-pay | Source: Home / Self Care | Attending: Gastroenterology

## 2019-04-16 DIAGNOSIS — F458 Other somatoform disorders: Secondary | ICD-10-CM | POA: Diagnosis present

## 2019-04-16 DIAGNOSIS — R142 Eructation: Secondary | ICD-10-CM

## 2019-04-16 DIAGNOSIS — K219 Gastro-esophageal reflux disease without esophagitis: Secondary | ICD-10-CM

## 2019-04-16 DIAGNOSIS — R09A2 Foreign body sensation, throat: Secondary | ICD-10-CM

## 2019-04-16 DIAGNOSIS — R0989 Other specified symptoms and signs involving the circulatory and respiratory systems: Secondary | ICD-10-CM | POA: Diagnosis not present

## 2019-04-16 HISTORY — PX: 24 HOUR PH STUDY: SHX5419

## 2019-04-16 HISTORY — PX: PH IMPEDANCE STUDY: SHX5565

## 2019-04-16 HISTORY — PX: ESOPHAGEAL MANOMETRY: SHX5429

## 2019-04-16 SURGERY — MANOMETRY, ESOPHAGUS

## 2019-04-16 MED ORDER — LIDOCAINE VISCOUS HCL 2 % MT SOLN
OROMUCOSAL | Status: AC
  Filled 2019-04-16: qty 15

## 2019-04-16 SURGICAL SUPPLY — 2 items
FACESHIELD LNG OPTICON STERILE (SAFETY) IMPLANT
GLOVE BIO SURGEON STRL SZ8 (GLOVE) ×6 IMPLANT

## 2019-04-16 NOTE — Progress Notes (Signed)
Esophageal Manometry done per protocol. Pt tolerated well without difficulty of complication. Ph probe placed per protocol at 37 cm. Pt educated on study and monitor  Using teach back. Pt voiced understanding and will return tomorrow at 1:30pm to have probe removed and monitor downloaded.

## 2019-04-17 ENCOUNTER — Encounter: Payer: Self-pay | Admitting: *Deleted

## 2019-04-25 DIAGNOSIS — R142 Eructation: Secondary | ICD-10-CM

## 2019-04-25 DIAGNOSIS — K219 Gastro-esophageal reflux disease without esophagitis: Secondary | ICD-10-CM

## 2019-04-25 DIAGNOSIS — R0989 Other specified symptoms and signs involving the circulatory and respiratory systems: Secondary | ICD-10-CM

## 2019-04-25 DIAGNOSIS — R09A2 Foreign body sensation, throat: Secondary | ICD-10-CM

## 2019-05-13 ENCOUNTER — Telehealth: Payer: Self-pay | Admitting: Gastroenterology

## 2019-05-13 NOTE — Telephone Encounter (Signed)
The patient understands that her manometry and Ph study are normal. She has been doing well with her swallowing except for the globus sensation with meats and breads. She is trying to be careful. She is asking what the significance or if there is any significance to the LES pressures. She has been reading her report. She is not interested in further testing, she is seeking understanding of her diagnosis.  Thanks

## 2019-05-14 NOTE — Telephone Encounter (Signed)
I think it will be better for her to come in so I can elaborate and discuss the test results in detail rather than over the phone.  Agree with holding off further testing.  Please schedule next available office visit.  Thank you

## 2019-05-14 NOTE — Telephone Encounter (Signed)
Message sent to the patient through My Chart. 

## 2019-05-19 ENCOUNTER — Ambulatory Visit
Admission: RE | Admit: 2019-05-19 | Discharge: 2019-05-19 | Disposition: A | Discharge status: Home / Self Care | Payer: Medicare Other | Source: Ambulatory Visit | Attending: Obstetrics & Gynecology | Admitting: Obstetrics & Gynecology

## 2019-05-19 ENCOUNTER — Other Ambulatory Visit: Payer: Self-pay

## 2019-05-19 DIAGNOSIS — Z1231 Encounter for screening mammogram for malignant neoplasm of breast: Secondary | ICD-10-CM

## 2019-06-20 ENCOUNTER — Ambulatory Visit: Payer: 59 | Admitting: Obstetrics & Gynecology

## 2019-07-01 NOTE — Progress Notes (Signed)
69 y.o. [id number] Married White or Caucasian female here for annual exam.  Recently saw her mother who was diagnosed with bronchitis.  After three OVs, her 32 mother was in congestive heart failure.  Was hospitalized for 5 more days.  She will be moving to an assisted living.  Her mother is currently back at home.    Denies vaginal bleeding.  She is having some vaginal irritation.  Denies vaginal discharge.  Had endoscopy in February with Dr. Silverio Decamp.  She's had some episodes of having trouble swallowing.  She's had manometry.  Hasn't fully gotten results about the test.    Declines Covid vaccination until fully FDA approved.  Patient's last menstrual period was 12/05/2010.          Sexually active: Yes.    The current method of family planning is post menopausal status & BTL.    Exercising: Yes.    5 days a week Smoker:  no  Health Maintenance: Pap:  08-13-15 neg HPV HR neg, 02-08-18 neg History of abnormal Pap:  yes MMG:  05-19-2019 category b density birads 1:neg Colonoscopy:  02/2015 normal f/u 30yrs BMD:   01-21-14 neg TDaP:  2012  Pneumonia vaccine(s):  2018 Shingrix:   2018, 2019 Hep C testing: donated blood in the past Screening Labs: done 03/11/2019   reports that she has never smoked. She has never used smokeless tobacco. She reports current alcohol use. She reports that she does not use drugs.  Past Medical History:  Diagnosis Date  . AC (acromioclavicular) joint bone spurs   . Allergy   . Anemia   . Anxiety   . Degenerative arthritis   . Depression   . GERD (gastroesophageal reflux disease)   . Goiter    multi nodular   . History of anemia   . History of PCOS   . Hyperlipidemia   . Hypertension   . Nevus of left eye   . Nuclear cataract 06/13/2011  . PCOS (polycystic ovarian syndrome)   . Status post intraocular lens implant 06/13/2011    Past Surgical History:  Procedure Laterality Date  . [place] STUDY  04/16/2019   Procedure: [place] STUDY;  Surgeon:  Mauri Pole, MD;  Location: [facility];  Service: Endoscopy;;  . BUNIONECTOMY Bilateral 2006   Feet  . CATARACT EXTRACTION Left   . COLONOSCOPY    . ESOPHAGEAL MANOMETRY N/A 04/16/2019   Procedure: ESOPHAGEAL MANOMETRY (EM) with 24 hour PH Impedence study;  Surgeon: Mauri Pole, MD;  Location: [facility];  Service: Endoscopy;  Laterality: N/A;  . HERNIA REPAIR Right 1959  . [place] IMPEDANCE STUDY N/A 04/16/2019   Procedure: [place] IMPEDANCE STUDY;  Surgeon: Mauri Pole, MD;  Location: [facility];  Service: Endoscopy;  Laterality: N/A;  . TOE SURGERY Left 2006   Bone Spur  . TONSILLECTOMY AND ADENOIDECTOMY  1959  . TUBAL LIGATION  1995    Current Outpatient Medications  Medication Sig Dispense Refill  . amLODipine (NORVASC) 2.5 MG tablet Take 1 tablet Daily for BP 90 tablet 3  . aspirin EC 81 MG tablet Take 81 mg by mouth daily.    . B Complex Vitamins (VITAMIN B COMPLEX PO) Take by mouth.    . Cetirizine HCl (ZYRTEC ALLERGY PO) Take by mouth as needed.     . Cholecalciferol (VITAMIN D PO) Take 10,000 Units by mouth daily.     . COD LIVER OIL PO Take by mouth daily.    Marland Kitchen  Coenzyme Q10 100 MG TABS Take by mouth daily.    . diphenhydrAMINE (BENADRYL) 25 MG tablet Take 25 mg by mouth as needed.     Marland Kitchen estradiol (ESTRACE) 0.1 MG/GM vaginal cream daily as needed.    . Estradiol 10 MCG TABS vaginal tablet INSERT 1 TABLET VAGINALLY  TWICE WEEKLY 24 tablet 2  . fexofenadine (ALLEGRA) 180 MG tablet Take 180 mg by mouth daily. Alternating with Zyrtec    . GLUCOSAMINE-CHONDROITIN PO Take 1,500 Units by mouth daily.     Marland Kitchen MAGNESIUM PO Take 800 Units by mouth daily.     Marland Kitchen MELATONIN PO Take by mouth as needed. Reported on 08/13/2015    . Multiple Vitamins-Minerals (HAIR/SKIN/NAILS/BIOTIN) TABS Take 3 tablets by mouth daily.    . Omega-3 Fatty Acids (FISH OIL PO) Take 1,200 mg by mouth.     . Probiotic Product (PROBIOTIC DAILY PO) Take by mouth daily.    . rizatriptan (MAXALT) 10  MG tablet Take 1 tablet (10 mg total) by mouth once as needed for migraine. May repeat in 2 hours if needed 30 tablet 2  . rosuvastatin (CRESTOR) 40 MG tablet TAKE 1 TABLET BY MOUTH  DAILY FOR CHOLESTEROL (Patient taking differently: 1/2 tablet Mon,  Wed, Fri) 90 tablet 3  . spironolactone (ALDACTONE) 100 MG tablet TAKE 2 TABLETS BY MOUTH  DAILY 180 tablet 1  . valACYclovir (VALTREX) 1000 MG tablet TAKE 1 TABLET BY MOUTH  DAILY 90 tablet 1  . zinc gluconate 50 MG tablet Take 50 mg by mouth daily.     No current facility-administered medications for this visit.    Family History  Problem Relation Age of Onset  . Thyroid disease Mother        hyperthyroidism  . Hypertension Mother   . Hyperlipidemia Mother   . Heart disease Mother 34  . Atrial fibrillation Mother   . Dementia Mother   . Congestive Heart Failure Mother   . Heart disease Father   . Lung cancer Father   . Liver cancer Father   . Cancer Brother 66       sarcoma  . Hyperlipidemia Brother   . Heart disease Brother 47  . Stroke Paternal Grandmother   . Emphysema Maternal Grandfather   . Thyroid disease Daughter   . Other Daughter        gluten sensetivity  . Polycystic ovary syndrome Daughter   . Colon cancer Neg Hx   . Esophageal cancer Neg Hx   . Rectal cancer Neg Hx   . Stomach cancer Neg Hx     Review of Systems  Constitutional: Negative.   HENT: Negative.   Eyes: Negative.   Respiratory: Negative.   Cardiovascular: Negative.   Gastrointestinal: Negative.   Endocrine: Negative.   Genitourinary:       Soreness in vaginal area  Musculoskeletal: Negative.   Skin: Negative.   Allergic/Immunologic: Negative.   Neurological: Negative.   Psychiatric/Behavioral: Negative.     Exam:   BP 110/64   Temp (!) 97.2 F (36.2 C) (Skin)   Ht 5\' 7"  (1.702 m)   Wt 144 lb (65.3 kg)   LMP 12/05/2010   BMI 22.55 kg/m   Height: 5\' 7"  (170.2 cm)  General appearance: alert, cooperative and appears stated age Head:  Normocephalic, without obvious abnormality, atraumatic Neck: no adenopathy, supple, symmetrical, trachea midline and thyroid normal to inspection and palpation Lungs: clear to auscultation bilaterally Breasts: normal appearance, no masses or tenderness Heart: regular rate  and rhythm Abdomen: soft, non-tender; bowel sounds normal; no masses,  no organomegaly Extremities: extremities normal, atraumatic, no cyanosis or edema Skin: Skin color, texture, turgor normal. No rashes or lesions Lymph nodes: Cervical, supraclavicular, and axillary nodes normal. No abnormal inguinal nodes palpated Neurologic: Grossly normal   Pelvic: External genitalia:  no lesions              Urethra:  normal appearing urethra with no masses, tenderness or lesions              Bartholins and Skenes: normal                 Vagina: erythematous appearing vagina with mild discharge noted, no lesions              Cervix: no lesions              Pap taken: yes Bimanual Exam:  Uterus:  normal size, contour, position, consistency, mobility, non-tender              Adnexa: normal adnexa and no mass, fullness, tenderness               Rectovaginal: Confirms               Anus:  normal sphincter tone, no lesions  Chaperone, Terence Lux, CMA, was present for exam.  A:  Well Woman with normal exam PMP, no HRT H/o PCOS H/o hirsutism Recent GI evaluation Vaginal irritation  P:   Mammogram guidelines reviewed pap smear obtained today Affirm obtained today BMD ordered to be done with MMG RF estradiol cream 1 gram pv twice weekly.  #42.5gm/2RF.  May not need this if vaginitis testing is positive RF for vagifem 107meq pv twice weekly.  #24/4RF.   return annually or prn

## 2019-07-07 ENCOUNTER — Encounter: Payer: Self-pay | Admitting: Obstetrics & Gynecology

## 2019-07-07 ENCOUNTER — Other Ambulatory Visit (HOSPITAL_COMMUNITY)
Admission: RE | Admit: 2019-07-07 | Discharge: 2019-07-07 | Disposition: A | Discharge status: Home / Self Care | Payer: Medicare Other | Source: Ambulatory Visit | Attending: Obstetrics & Gynecology | Admitting: Obstetrics & Gynecology

## 2019-07-07 ENCOUNTER — Ambulatory Visit (INDEPENDENT_AMBULATORY_CARE_PROVIDER_SITE_OTHER): Payer: Medicare Other | Admitting: Obstetrics & Gynecology

## 2019-07-07 ENCOUNTER — Other Ambulatory Visit: Payer: Self-pay

## 2019-07-07 VITALS — BP 110/64 | Temp 97.2°F | Ht 67.0 in | Wt 144.0 lb

## 2019-07-07 DIAGNOSIS — Z780 Asymptomatic menopausal state: Secondary | ICD-10-CM | POA: Diagnosis not present

## 2019-07-07 DIAGNOSIS — Z124 Encounter for screening for malignant neoplasm of cervix: Secondary | ICD-10-CM | POA: Diagnosis not present

## 2019-07-07 DIAGNOSIS — Z01419 Encounter for gynecological examination (general) (routine) without abnormal findings: Secondary | ICD-10-CM

## 2019-07-07 DIAGNOSIS — E2839 Other primary ovarian failure: Secondary | ICD-10-CM

## 2019-07-07 DIAGNOSIS — N898 Other specified noninflammatory disorders of vagina: Secondary | ICD-10-CM | POA: Diagnosis not present

## 2019-07-07 DIAGNOSIS — Z1151 Encounter for screening for human papillomavirus (HPV): Secondary | ICD-10-CM | POA: Insufficient documentation

## 2019-07-07 MED ORDER — ESTRADIOL 0.1 MG/GM VA CREA: TOPICAL_CREAM | VAGINAL | 1 refills | Status: DC

## 2019-07-07 MED ORDER — ESTRADIOL 10 MCG VA TABS: ORAL_TABLET | VAGINAL | 4 refills | Status: DC

## 2019-07-07 NOTE — Patient Instructions (Signed)
Plan to repeat your bone density when you do your next mammogram.

## 2019-07-08 LAB — CYTOLOGY - PAP
Comment: NEGATIVE
Diagnosis: NEGATIVE
High risk HPV: NEGATIVE

## 2019-07-08 LAB — VAGINITIS/VAGINOSIS, DNA PROBE
Candida Species: NEGATIVE
Gardnerella vaginalis: POSITIVE — AB
Trichomonas vaginosis: NEGATIVE

## 2019-07-09 ENCOUNTER — Other Ambulatory Visit: Payer: Self-pay | Admitting: *Deleted

## 2019-07-09 MED ORDER — TINIDAZOLE 500 MG PO TABS: 1000.0000 mg | ORAL_TABLET | Freq: Every day | ORAL | 0 refills | Status: AC

## 2019-08-21 ENCOUNTER — Telehealth: Payer: Self-pay | Admitting: *Deleted

## 2019-08-21 ENCOUNTER — Ambulatory Visit: Payer: Medicare Other | Admitting: Obstetrics & Gynecology

## 2019-08-21 ENCOUNTER — Encounter: Payer: Self-pay | Admitting: Obstetrics & Gynecology

## 2019-08-21 ENCOUNTER — Other Ambulatory Visit: Payer: Self-pay

## 2019-08-21 VITALS — BP 120/70 | HR 70 | Temp 97.0°F | Resp 16 | Wt 145.0 lb

## 2019-08-21 DIAGNOSIS — N9089 Other specified noninflammatory disorders of vulva and perineum: Secondary | ICD-10-CM | POA: Diagnosis not present

## 2019-08-21 MED ORDER — TRIAMCINOLONE ACETONIDE 0.025 % EX OINT
1.0000 | TOPICAL_OINTMENT | Freq: Two times a day (BID) | CUTANEOUS | 0 refills | Status: DC
Start: 2019-08-21 — End: 2019-09-17

## 2019-08-21 NOTE — Telephone Encounter (Signed)
AEX 07/07/19 +BV 5/3 and was treated with 5 days of tindamax Rx per result notes on 07/09/19.   Spoke with pt. Pt states took all of Rx tindamax after getting dx with BV. Pt states sx resolved. Pt states for last 1-2 weeks,  Pt reports having vaginal itching, discharge, odor. Pt denies fever, chills, UTI sx. Pt states wears panty liner every day due to mild urinary incontinence and not always changing her exercise clothes after before doing other chores.  Pt advised to have OV with Dr Sabra Heck for further evaluation. Pt agreeable. Pt scheduled for today at 1 pm. Pt agreeable and verbalized understanding. CPS neg.   Routing to Dr Sabra Heck for review.  Encounter closed.

## 2019-08-21 NOTE — Progress Notes (Signed)
GYNECOLOGY  VISIT  CC:   Vulvar irritation  HPI: 69 y.o. [phone] Married White or Caucasian female here for complaint of vaginal itching and some discomfort during intercourse.  She feels she always has some vaginal discharge but she wears a panty liner every day.  Denies vaginal bleeding.  The itching is mostly external.  Was using some external wipes but these were scented so she wonders if this was the cause.  Denies urine complaints.  She has a little urinary incontinence that is not new.  GYNECOLOGIC HISTORY: Patient's last menstrual period was 12/05/2010. Contraception: BTL Menopausal hormone therapy: estrace cream & vaginal tablet  Patient Active Problem List   Diagnosis Date Noted  . Gastroesophageal reflux disease   . Globus sensation   . Belching   . History of cold sores 02/08/2017  . Migraine 02/08/2017  . Medication management 05/01/2014  . Essential hypertension 03/16/2013  . Mixed hyperlipidemia 03/16/2013  . Vitamin D deficiency 03/16/2013  . Postmenopausal atrophic vaginitis 05/21/2012    Past Medical History:  Diagnosis Date  . AC (acromioclavicular) joint bone spurs   . Allergy   . Anemia   . Anxiety   . Degenerative arthritis   . Depression   . GERD (gastroesophageal reflux disease)   . Goiter    multi nodular   . History of anemia   . History of PCOS   . Hyperlipidemia   . Hypertension   . Nevus of left eye   . Nuclear cataract 06/13/2011  . PCOS (polycystic ovarian syndrome)   . Status post intraocular lens implant 06/13/2011    Past Surgical History:  Procedure Laterality Date  . [place] STUDY  04/16/2019   Procedure: [place] STUDY;  Surgeon: Mauri Pole, MD;  [facility];  Service: Endoscopy;;  . BUNIONECTOMY Bilateral 2006   Feet  . CATARACT EXTRACTION Left   . COLONOSCOPY    . ESOPHAGEAL MANOMETRY N/A 04/16/2019   Procedure: ESOPHAGEAL MANOMETRY (EM) with 24 hour PH Impedence study;  Surgeon: Mauri Pole, MD;   [facility];  Service: Endoscopy;  Laterality: N/A;  . HERNIA REPAIR Right 1959  . [place] IMPEDANCE STUDY N/A 04/16/2019   Procedure: [place] IMPEDANCE STUDY;  Surgeon: Mauri Pole, MD;  [facility];  Service: Endoscopy;  Laterality: N/A;  . TOE SURGERY Left 2006   Bone Spur  . TONSILLECTOMY AND ADENOIDECTOMY  1959  . TUBAL LIGATION  1995    MEDS:   Current Outpatient Medications on File Prior to Visit  Medication Sig Dispense Refill  . amLODipine (NORVASC) 2.5 MG tablet Take 1 tablet Daily for BP 90 tablet 3  . aspirin EC 81 MG tablet Take 81 mg by mouth daily.    . B Complex Vitamins (VITAMIN B COMPLEX PO) Take by mouth.    . Cetirizine HCl (ZYRTEC ALLERGY PO) Take by mouth as needed.     . Cholecalciferol (VITAMIN D PO) Take 10,000 Units by mouth daily.     . COD LIVER OIL PO Take by mouth daily.    . Coenzyme Q10 100 MG TABS Take by mouth daily.    . diphenhydrAMINE (BENADRYL) 25 MG tablet Take 25 mg by mouth as needed.     Marland Kitchen estradiol (ESTRACE) 0.1 MG/GM vaginal cream 1 gram pv twice weekly 42.5 g 1  . Estradiol 10 MCG TABS vaginal tablet 1 tab pv twice weekly 24 tablet 4  . fexofenadine (ALLEGRA) 180 MG tablet Take 180  mg by mouth daily. Alternating with Zyrtec    . GLUCOSAMINE-CHONDROITIN PO Take 1,500 Units by mouth daily.     Marland Kitchen MAGNESIUM PO Take 800 Units by mouth daily.     Marland Kitchen MELATONIN PO Take by mouth as needed. Reported on 08/13/2015    . Multiple Vitamins-Minerals (HAIR/SKIN/NAILS/BIOTIN) TABS Take 3 tablets by mouth daily.    . Omega-3 Fatty Acids (FISH OIL PO) Take 1,200 mg by mouth.     . Probiotic Product (PROBIOTIC DAILY PO) Take by mouth daily.    . rizatriptan (MAXALT) 10 MG tablet Take 1 tablet (10 mg total) by mouth once as needed for migraine. May repeat in 2 hours if needed 30 tablet 2  . rosuvastatin (CRESTOR) 40 MG tablet TAKE 1 TABLET BY MOUTH  DAILY FOR CHOLESTEROL (Patient taking differently: 1/2 tablet Mon,  Wed, Fri) 90 tablet 3  .  spironolactone (ALDACTONE) 100 MG tablet TAKE 2 TABLETS BY MOUTH  DAILY 180 tablet 1  . valACYclovir (VALTREX) 1000 MG tablet TAKE 1 TABLET BY MOUTH  DAILY (Patient taking differently: as needed. ) 90 tablet 1  . zinc gluconate 50 MG tablet Take 50 mg by mouth daily.     No current facility-administered medications on file prior to visit.    ALLERGIES: Bactrim [sulfamethoxazole-trimethoprim], Lipitor [atorvastatin], and Monistat [miconazole]  Family History  Problem Relation Age of Onset  . Thyroid disease Mother        hyperthyroidism  . Hypertension Mother   . Hyperlipidemia Mother   . Heart disease Mother 53  . Atrial fibrillation Mother   . Dementia Mother   . Congestive Heart Failure Mother   . Heart disease Father   . Lung cancer Father   . Liver cancer Father   . Cancer Brother 40       sarcoma  . Hyperlipidemia Brother   . Heart disease Brother 39  . Stroke Paternal Grandmother   . Emphysema Maternal Grandfather   . Thyroid disease Daughter   . Other Daughter        gluten sensetivity  . Polycystic ovary syndrome Daughter   . Colon cancer Neg Hx   . Esophageal cancer Neg Hx   . Rectal cancer Neg Hx   . Stomach cancer Neg Hx     SH:  Married, non smoker  Review of Systems  Constitutional: Negative.   HENT: Negative.   Eyes: Negative.   Respiratory: Negative.   Cardiovascular: Negative.   Gastrointestinal: Negative.   Endocrine: Negative.   Genitourinary: Negative.        Vaginal itching & irritation  Musculoskeletal: Negative.   Skin: Negative.   Allergic/Immunologic: Negative.   Neurological: Negative.   Hematological: Negative.   Psychiatric/Behavioral: Negative.     PHYSICAL EXAMINATION:    BP 120/70   Pulse 70   Temp (!) 97 F (36.1 C) (Skin)   Resp 16   Wt 145 lb (65.8 kg)   LMP 12/05/2010   BMI 22.71 kg/m     General appearance: alert, cooperative and appears stated age Lymph:  no inguinal LAD noted  Pelvic: External genitalia:  no  lesions              Urethra:  normal appearing urethra with no masses, tenderness or lesions              Bartholins and Skenes: normal                 Vagina: normal appearing vagina with  normal color and discharge, no lesions              Cervix: no lesions              Bimanual Exam:  Uterus:  normal size, contour, position, consistency, mobility, non-tender              Adnexa: no mass, fullness, tenderness  Chaperone, Terence Lux, CMA, was present for exam.  Assessment: Vulvar irritation, recent scented wipe use BV diagnosed a month ago Dryness with intercourse  Plan: Triamcinolone 0.025% ointment bid to pharmacy. Affirm pending.  If negative, will use vit E vaginal suppositories pv twice weekly.  If positive, will treat as indicated by test results. Pt has already stopped using the scented wipes.

## 2019-08-21 NOTE — Telephone Encounter (Signed)
Christine [name]  P [facility] Good morning. Following the treatment for my vaginal infection I felt much better and thought it was gone. However, as time has gone by I have been increasingly itchy in the vaginal area and somewhat uncomfortable during intercourse. Should I make an appointment to be rechecked or is it possible to call in a prescription? Maybe if I took the messier treatment that was localized I might be able to kick this infection. Also not sure what might be contributing to the problem. Sometimes I just leave my exercise clothes on to clean house and have wondered if the tight pants might not be good.   Thanks so much for your time and help. Have a blessed day.

## 2019-08-22 ENCOUNTER — Other Ambulatory Visit: Payer: Self-pay | Admitting: Obstetrics & Gynecology

## 2019-08-22 LAB — VAGINITIS/VAGINOSIS, DNA PROBE
Candida Species: NEGATIVE
Gardnerella vaginalis: POSITIVE — AB
Trichomonas vaginosis: NEGATIVE

## 2019-08-22 MED ORDER — METRONIDAZOLE 0.75 % VA GEL
1.0000 | Freq: Every day | VAGINAL | 0 refills | Status: DC
Start: 2019-08-22 — End: 2019-09-09

## 2019-08-29 ENCOUNTER — Ambulatory Visit: Payer: Medicare Other | Admitting: Adult Health Nurse Practitioner

## 2019-09-02 NOTE — Progress Notes (Signed)
GYNECOLOGY  VISIT  CC:   Recheck BV  HPI: 69 y.o. [phone] Married White or Caucasian female here for f/u of vulvar irritation.  Reports after about two days of the current treatment, she started having increased itching.  She took OTC probiotic product.  Currently, denies vaginal bleeding.  She does not seem to have vaginal discharge.  Does wear a light day pad every days.  She reports right now she does seem "back to normal" and is very happy about this.  We did discuss recurrent BV treatment in case this is positive today.  Will await test results first.   GYNECOLOGIC HISTORY: Patient's last menstrual period was 12/05/2010. Contraception: btl Menopausal hormone therapy: estrace cream & vaginal tablet  Patient Active Problem List   Diagnosis Date Noted  . Gastroesophageal reflux disease   . Globus sensation   . Belching   . History of cold sores 02/08/2017  . Migraine 02/08/2017  . Medication management 05/01/2014  . Essential hypertension 03/16/2013  . Mixed hyperlipidemia 03/16/2013  . Vitamin D deficiency 03/16/2013  . Postmenopausal atrophic vaginitis 05/21/2012    Past Medical History:  Diagnosis Date  . AC (acromioclavicular) joint bone spurs   . Allergy   . Anemia   . Anxiety   . Degenerative arthritis   . Depression   . GERD (gastroesophageal reflux disease)   . Goiter    multi nodular   . History of anemia   . History of PCOS   . Hyperlipidemia   . Hypertension   . Nevus of left eye   . Nuclear cataract 06/13/2011  . PCOS (polycystic ovarian syndrome)   . Status post intraocular lens implant 06/13/2011    Past Surgical History:  Procedure Laterality Date  . [place] STUDY  04/16/2019   Procedure: [place] STUDY;  Surgeon: Mauri Pole, MD;  [facility];  Service: Endoscopy;;  . BUNIONECTOMY Bilateral 2006   Feet  . CATARACT EXTRACTION Left   . COLONOSCOPY    . ESOPHAGEAL MANOMETRY N/A 04/16/2019   Procedure: ESOPHAGEAL MANOMETRY  (EM) with 24 hour PH Impedence study;  Surgeon: Mauri Pole, MD;  [facility];  Service: Endoscopy;  Laterality: N/A;  . HERNIA REPAIR Right 1959  . [place] IMPEDANCE STUDY N/A 04/16/2019   Procedure: [place] IMPEDANCE STUDY;  Surgeon: Mauri Pole, MD;  [facility];  Service: Endoscopy;  Laterality: N/A;  . TOE SURGERY Left 2006   Bone Spur  . TONSILLECTOMY AND ADENOIDECTOMY  1959  . TUBAL LIGATION  1995    MEDS:   Current Outpatient Medications on File Prior to Visit  Medication Sig Dispense Refill  . amLODipine (NORVASC) 2.5 MG tablet Take 1 tablet Daily for BP 90 tablet 3  . aspirin EC 81 MG tablet Take 81 mg by mouth daily.    . B Complex Vitamins (VITAMIN B COMPLEX PO) Take by mouth.    . Cetirizine HCl (ZYRTEC ALLERGY PO) Take by mouth as needed.     . Cholecalciferol (VITAMIN D PO) Take 10,000 Units by mouth daily.     . COD LIVER OIL PO Take by mouth daily.    . Coenzyme Q10 100 MG TABS Take by mouth daily.    . diphenhydrAMINE (BENADRYL) 25 MG tablet Take 25 mg by mouth as needed.     Marland Kitchen estradiol (ESTRACE) 0.1 MG/GM vaginal cream 1 gram pv twice weekly 42.5 g 1  . Estradiol 10 MCG TABS vaginal tablet 1  tab pv twice weekly 24 tablet 4  . fexofenadine (ALLEGRA) 180 MG tablet Take 180 mg by mouth daily. Alternating with Zyrtec    . GLUCOSAMINE-CHONDROITIN PO Take 1,500 Units by mouth daily.     Marland Kitchen MAGNESIUM PO Take 800 Units by mouth daily.     Marland Kitchen MELATONIN PO Take by mouth as needed. Reported on 08/13/2015    . Multiple Vitamins-Minerals (HAIR/SKIN/NAILS/BIOTIN) TABS Take 3 tablets by mouth daily.    . Omega-3 Fatty Acids (FISH OIL PO) Take 1,200 mg by mouth.     . Probiotic Product (PROBIOTIC DAILY PO) Take by mouth daily.    . rizatriptan (MAXALT) 10 MG tablet Take 1 tablet (10 mg total) by mouth once as needed for migraine. May repeat in 2 hours if needed 30 tablet 2  . rosuvastatin (CRESTOR) 40 MG tablet TAKE 1 TABLET BY MOUTH  DAILY FOR CHOLESTEROL  (Patient taking differently: 1/2 tablet Mon,  Wed, Fri) 90 tablet 3  . spironolactone (ALDACTONE) 100 MG tablet TAKE 2 TABLETS BY MOUTH  DAILY 180 tablet 1  . valACYclovir (VALTREX) 1000 MG tablet TAKE 1 TABLET BY MOUTH  DAILY (Patient taking differently: as needed. ) 90 tablet 1  . zinc gluconate 50 MG tablet Take 50 mg by mouth daily.    Marland Kitchen triamcinolone (KENALOG) 0.025 % ointment Apply 1 application topically 2 (two) times daily. (Patient not taking: Reported on 09/09/2019) 30 g 0   No current facility-administered medications on file prior to visit.    ALLERGIES: Bactrim [sulfamethoxazole-trimethoprim], Lipitor [atorvastatin], and Monistat [miconazole]  Family History  Problem Relation Age of Onset  . Thyroid disease Mother        hyperthyroidism  . Hypertension Mother   . Hyperlipidemia Mother   . Heart disease Mother 24  . Atrial fibrillation Mother   . Dementia Mother   . Congestive Heart Failure Mother   . Heart disease Father   . Lung cancer Father   . Liver cancer Father   . Cancer Brother 72       sarcoma  . Hyperlipidemia Brother   . Heart disease Brother 7  . Stroke Paternal Grandmother   . Emphysema Maternal Grandfather   . Thyroid disease Daughter   . Other Daughter        gluten sensetivity  . Polycystic ovary syndrome Daughter   . Colon cancer Neg Hx   . Esophageal cancer Neg Hx   . Rectal cancer Neg Hx   . Stomach cancer Neg Hx     SH:  Married, non smoker  Review of Systems  Constitutional: Negative.   HENT: Negative.   Eyes: Negative.   Respiratory: Negative.   Cardiovascular: Negative.   Gastrointestinal: Negative.   Endocrine: Negative.   Genitourinary: Negative.   Musculoskeletal: Negative.   Skin: Negative.   Allergic/Immunologic: Negative.   Neurological: Negative.   Hematological: Negative.   Psychiatric/Behavioral: Negative.     PHYSICAL EXAMINATION:    BP 114/70   Pulse 70   Resp 16   Wt 146 lb (66.2 kg)   LMP 12/05/2010    BMI 22.87 kg/m     General appearance: alert, cooperative and appears stated age Lymph:  no inguinal LAD noted  Pelvic: External genitalia:  no lesions              Urethra:  normal appearing urethra with no masses, tenderness or lesions              Bartholins and Skenes: normal  Vagina: normal appearing vagina with normal color and discharge, no lesions              Cervix: no lesions  Chaperone, Royal Hawthorn, CMA, was present for exam.  Assessment: BV with two prior positive tests, today without any symptoms  Plan: Repeat BV and yeast testing obtained today.  Will notify pt of results and recommendations if testing is positive.

## 2019-09-09 ENCOUNTER — Other Ambulatory Visit: Payer: Self-pay

## 2019-09-09 ENCOUNTER — Encounter: Payer: Self-pay | Admitting: Obstetrics & Gynecology

## 2019-09-09 ENCOUNTER — Ambulatory Visit: Payer: Medicare Other | Admitting: Obstetrics & Gynecology

## 2019-09-09 VITALS — BP 114/70 | HR 70 | Resp 16 | Wt 146.0 lb

## 2019-09-09 DIAGNOSIS — N760 Acute vaginitis: Secondary | ICD-10-CM | POA: Diagnosis not present

## 2019-09-11 LAB — NUSWAB BV AND CANDIDA, NAA
Candida albicans, NAA: NEGATIVE
Candida glabrata, NAA: NEGATIVE

## 2019-09-15 NOTE — Progress Notes (Signed)
MEDICARE WELLNESS  Assessment and Plan:  Medicare wellness 1 year Declines COVID vaccine- states can not trust due to agenda of people  Essential hypertension - continue medications, DASH diet, exercise and monitor at home. Call if greater than 130/80.  -     CBC with Diff -     COMPLETE METABOLIC PANEL WITH GFR -     TSH  Medication management -     Magnesium  Migraine with aura and without status migrainosus, not intractable Avoid triggers, monitor  Mixed hyperlipidemia -     Lipid Profile check lipids decrease fatty foods increase activity.  Postmenopausal atrophic vaginitis Monitor- following with Dr. Sabra Heck  Vitamin D deficiency -     Vitamin D (25 hydroxy)  History of cold sores Monitor  Globus sensation Improving, will continue to monitor  Thyroid nodule Patient is on biotin, will check- may need to come back off biotin -     TSH -     T4, free -     Thyroglobulin antibody -     Thyroid peroxidase antibody  B12 deficiency -     Methylmalonic acid, serum     Discussed med's effects and SE's. Screening labs and tests as requested with regular follow-up as recommended. Future Appointments  Date Time Provider [place]  03/10/2020  9:00 AM Vicie Mutters, PA-C [id number] None  09/10/2020  2:30 PM Megan Salon, MD [place] None    Medicare Attestation I have personally reviewed: The patient's medical and social history Their use of alcohol, tobacco or illicit drugs Their current medications and supplements The patient's functional ability including ADLs,fall risks, home safety risks, cognitive, and hearing and visual impairment Diet and physical activities Evidence for depression or mood disorders  The patient's weight, height, BMI, and visual acuity have been recorded in the chart.  I have made referrals, counseling, and provided education to the patient based on review of the above and I have provided the patient with a written personalized  care plan for preventive services.    MEDICARE WELLNESS OBJECTIVES: Physical activity: Current Exercise Habits: Home exercise routine, Type of exercise: walking, Frequency (Times/Week): 4, Intensity: Mild Cardiac risk factors:   Depression/mood screen:   Depression screen [facility] 2/9 09/17/2019  Decreased Interest 0  Down, Depressed, Hopeless 0  PHQ - 2 Score 0    ADLs:  In your present state of health, do you have any difficulty performing the following activities: 09/17/2019  Hearing? N  Vision? N  Difficulty concentrating or making decisions? N  Walking or climbing stairs? N  Dressing or bathing? N  Doing errands, shopping? N  Some recent data might be hidden     Cognitive Testing  Alert? Yes  Normal Appearance?Yes  Oriented to person? Yes  Place? Yes   Time? Yes  Recall of three objects?  Yes  Can perform simple calculations? Yes  Displays appropriate judgment?Yes  Can read the correct time from a watch face?Yes  EOL planning: Does Patient Have a Medical Advance Directive?: No Would patient like information on creating a medical advance directive?: Yes (MAU/Ambulatory/Procedural Areas - Information given)    HPI  69 y.o. female  presents for a medicare wellness  Has had some stress with her mom, had CHF/dementia, 10- now in assisted living but recently broke her food.   BMI is Body mass index is 22.55 kg/m., she is working on diet and exercise. Wt Readings from Last 3 Encounters:  09/17/19 144 lb (65.3 kg)  09/09/19  146 lb (66.2 kg)  08/21/19 145 lb (65.8 kg)   She has been following with Dr. Silverio Decamp for dysphagia/glubulus sensation.  03/2019 EGD: Normal esophaus, TTS dilator 50mm, esophageal biopsies negative for EOE and gastric biopsies negative for H.pylori Normal manometry and Ph study  She continues to have some dysphagia with very specific foods, not happening as often.   Her blood pressure has been controlled at home, today their BP is BP: 118/84.  She does  workout. She denies chest pain, shortness of breath, dizziness.   She is on cholesterol medication 40mg  1/2 a pill 3 x a week and denies myalgias- had grade 2 muscle tear with higher dose. Her cholesterol is at goal. The cholesterol last visit was:  Lab Results  Component Value Date   CHOL 241 (H) 03/11/2019   HDL 59 03/11/2019   LDLCALC 150 (H) 03/11/2019   TRIG 183 (H) 03/11/2019   CHOLHDL 4.1 03/11/2019    Last A1C in the office was:  Lab Results  Component Value Date   HGBA1C 5.6 08/28/2018   Patient is on Vitamin D supplement.   Lab Results  Component Value Date   VD25OH 59 03/11/2019        Current Medications:    Current Outpatient Medications (Cardiovascular):  .  amLODipine (NORVASC) 2.5 MG tablet, Take 1 tablet Daily for BP .  rosuvastatin (CRESTOR) 40 MG tablet, TAKE 1 TABLET BY MOUTH  DAILY FOR CHOLESTEROL (Patient taking differently: 1/2 tablet Mon,  Wed, Fri) .  spironolactone (ALDACTONE) 100 MG tablet, TAKE 2 TABLETS BY MOUTH  DAILY  Current Outpatient Medications (Respiratory):  Marland Kitchen  Cetirizine HCl (ZYRTEC ALLERGY PO), Take by mouth as needed.  .  diphenhydrAMINE (BENADRYL) 25 MG tablet, Take 25 mg by mouth as needed.  .  fexofenadine (ALLEGRA) 180 MG tablet, Take 180 mg by mouth daily. Alternating with Zyrtec  Current Outpatient Medications (Analgesics):  .  aspirin EC 81 MG tablet, Take 81 mg by mouth daily. .  rizatriptan (MAXALT) 10 MG tablet, Take 1 tablet (10 mg total) by mouth once as needed for migraine. May repeat in 2 hours if needed   Current Outpatient Medications (Other):  Marland Kitchen  B Complex Vitamins (VITAMIN B COMPLEX PO), Take by mouth. .  Cholecalciferol (VITAMIN D PO), Take 10,000 Units by mouth daily.  .  COD LIVER OIL PO, Take by mouth daily. .  Coenzyme Q10 100 MG TABS, Take by mouth daily. Marland Kitchen  estradiol (ESTRACE) 0.1 MG/GM vaginal cream, 1 gram pv twice weekly .  Estradiol 10 MCG TABS vaginal tablet, 1 tab pv twice weekly .   GLUCOSAMINE-CHONDROITIN PO, Take 1,500 Units by mouth daily.  Marland Kitchen  MAGNESIUM PO, Take 800 Units by mouth daily.  Marland Kitchen  MELATONIN PO, Take by mouth as needed. Reported on 08/13/2015 .  Multiple Vitamins-Minerals (HAIR/SKIN/NAILS/BIOTIN) TABS, Take 3 tablets by mouth daily. .  Omega-3 Fatty Acids (FISH OIL PO), Take 1,200 mg by mouth.  .  Probiotic Product (PROBIOTIC DAILY PO), Take by mouth daily. .  valACYclovir (VALTREX) 1000 MG tablet, TAKE 1 TABLET BY MOUTH  DAILY (Patient taking differently: as needed. ) .  zinc gluconate 50 MG tablet, Take 50 mg by mouth daily.  Health Maintenance:   Immunization History  Administered Date(s) Administered  . Influenza Split 12/23/2013, 12/24/2014  . Influenza Whole 12/16/2012  . Influenza, High Dose Seasonal PF 12/15/2016, 11/18/2017, 12/24/2018  . Influenza,inj,quad, With Preservative 12/24/2015  . Influenza-Unspecified 12/20/2016  . Pneumococcal Conjugate-13 12/15/2016  .  Pneumococcal Polysaccharide-23 08/24/2004  . Tdap 11/28/2010  . Zoster 01/19/2012  . Zoster Recombinat (Shingrix) 12/27/2016, 04/03/2017   Health Maintenance  Topic Date Due  . Hepatitis C Screening  Never done  . INFLUENZA VACCINE  10/05/2019  . TETANUS/TDAP  11/27/2020  . MAMMOGRAM  05/18/2021  . COLONOSCOPY  02/04/2025  . DEXA SCAN  Completed  . PNA vac Low Risk Adult  Completed  . COVID-19 Vaccine  Discontinued    Tetanus:  2012 Pneumovax: 2006 Prevnar 13: 2018 Flu vaccine: 2020 Zostavax: 2013 Shingrix: 2018 Hepatits A and B  PAP 02/2018 Dr. Sabra Heck MGM: 04/2019 DEXA: 01/21/14 Colonoscopy: 2016 was seeing Dr. Peggye Ley Stress test 2006 US transvaginal 2014 US thyroid 2019  IMPRESSION: 1. Similar findings of multinodular goiter. No definitive worrisome new or enlarging thyroid nodules. 2. Previously identified thyroid nodules appear similar to the 07/2012 examination. Stability for 5 years is indicative of benign etiology and as such, percutaneous sampling  and/or continued dedicated follow-up of any of the nodules is not recommended.  Opthalmology:  Dr. Harle Stanford, and Dr. Annamaria Boots  Patient Care Team: Vicie Mutters, PA-C as PCP - General (Physician Assistant) Megan Salon, MD as Consulting Physician (Gynecology) Carlena Bjornstad, MD as Consulting Physician (Cardiology) Inda Castle, MD (Inactive) as Consulting Physician (Gastroenterology) Monna Fam, MD as Consulting Physician (Ophthalmology) Gerarda Fraction, MD as Referring Physician (Ophthalmology) Glenna Fellows, MD as Attending Physician (Neurosurgery) [place], Jennefer Bravo, MD as Referring Physician (Dermatology) Marybelle Killings, MD as Consulting Physician (Orthopedic Surgery)  Allergies:  Allergies  Allergen Reactions  . Bactrim [Sulfamethoxazole-Trimethoprim] Itching  . Lipitor [Atorvastatin] Other (See Comments)    headache  . Monistat [Miconazole] Itching    burning    Medical History:  Past Medical History:  Diagnosis Date  . AC (acromioclavicular) joint bone spurs   . Allergy   . Anemia   . Anxiety   . Degenerative arthritis   . Depression   . GERD (gastroesophageal reflux disease)   . Goiter    multi nodular   . History of anemia   . History of PCOS   . Hyperlipidemia   . Hypertension   . Nevus of left eye   . Nuclear cataract 06/13/2011  . PCOS (polycystic ovarian syndrome)   . Status post intraocular lens implant 06/13/2011   Allergies Allergies  Allergen Reactions  . Bactrim [Sulfamethoxazole-Trimethoprim] Itching  . Lipitor [Atorvastatin] Other (See Comments)    headache  . Monistat [Miconazole] Itching    burning    SURGICAL HISTORY She  has a past surgical history that includes Tubal ligation (1995); Tonsillectomy and adenoidectomy (1959); Bunionectomy (Bilateral, 2006); Toe Surgery (Left, 2006); Hernia repair (Right, 1959); Cataract extraction (Left); Colonoscopy; Esophageal manometry (N/A, 04/16/2019); PH impedance study (N/A, 04/16/2019);  and 24 hour ph study (04/16/2019). FAMILY HISTORY Her family history includes Atrial fibrillation in her mother; Cancer (age of onset: 109) in her brother; Congestive Heart Failure in her mother; Dementia in her mother; Emphysema in her maternal grandfather; Heart disease in her father; Heart disease (age of onset: 11) in her brother; Heart disease (age of onset: 84) in her mother; Hyperlipidemia in her brother and mother; Hypertension in her mother; Liver cancer in her father; Lung cancer in her father; Other in her daughter; Polycystic ovary syndrome in her daughter; Stroke in her paternal grandmother; Thyroid disease in her daughter and mother. SOCIAL HISTORY She  reports that she has never smoked. She has never used smokeless tobacco. She reports current alcohol  use. She reports that she does not use drugs.  Review of Systems: Review of Systems  Constitutional: Negative for chills, diaphoresis, fever, malaise/fatigue and weight loss.  HENT: Positive for congestion. Negative for ear pain and sore throat.   Eyes: Negative.   Respiratory: Negative for cough, shortness of breath and wheezing.   Cardiovascular: Negative for chest pain, palpitations and leg swelling.  Gastrointestinal: Positive for heartburn. Negative for abdominal pain, blood in stool, constipation, diarrhea, melena, nausea and vomiting.  Genitourinary: Negative.   Skin: Negative.  Negative for rash.  Neurological: Negative for dizziness, sensory change, loss of consciousness and headaches.  Psychiatric/Behavioral: Negative for depression. The patient is not nervous/anxious and does not have insomnia.     Physical Exam: Estimated body mass index is 22.55 kg/m as calculated from the following:   Height as of 07/07/19: 5\' 7"  (1.702 m).   Weight as of this encounter: 144 lb (65.3 kg). BP 118/84   Pulse 77   Temp 97.7 F (36.5 C)   Wt 144 lb (65.3 kg)   LMP 12/05/2010   SpO2 96%   BMI 22.55 kg/m     Wt Readings from Last 3  Encounters:  09/17/19 144 lb (65.3 kg)  09/09/19 146 lb (66.2 kg)  08/21/19 145 lb (65.8 kg)   General Appearance: Well norrished well developed, in no apparent distress.  Eyes: PERRLA, EOMs, conjunctiva no swelling or erythema ENT/Mouth: Ear canals normal without obstruction, swelling, erythema, or discharge.  TMs normal bilaterally with no erythema, bulging, retraction, or loss of landmark.  Oropharynx moist and clear with no exudate, erythema, or swelling.   Neck: Supple, thyroid normal. No bruits.  No cervical adenopathy Respiratory: Respiratory effort normal, Breath sounds clear A&P without wheeze, rhonchi, rales.   Cardio: RRR without murmurs, rubs or gallops. Brisk peripheral pulses without edema.  Chest: symmetric, with normal excursions Breasts: defer Abdomen: Soft, nontender, no guarding, no rebound, hernias, masses, or organomegaly.  Lymphatics: Non tender without lymphadenopathy.  Genitourinary: Deferred to Gyn for yearly checkups Musculoskeletal: Full ROM all peripheral extremities,5/5 strength, and normal gait.  Skin: Warm, dry without rashes, lesions, ecchymosis. Lipoma right thigh.  Neuro: Awake and oriented X 3, Cranial nerves intact, reflexes equal bilaterally. Normal muscle tone, no cerebellar symptoms. Sensation intact.  Psych:  normal affect, Insight and Judgment appropriate.   Vicie Mutters 9:52 AM [facility]

## 2019-09-17 ENCOUNTER — Encounter: Payer: Self-pay | Admitting: Physician Assistant

## 2019-09-17 ENCOUNTER — Ambulatory Visit: Payer: Medicare Other | Admitting: Physician Assistant

## 2019-09-17 ENCOUNTER — Other Ambulatory Visit: Payer: Self-pay

## 2019-09-17 VITALS — BP 118/84 | HR 77 | Temp 97.7°F | Wt 144.0 lb

## 2019-09-17 DIAGNOSIS — E559 Vitamin D deficiency, unspecified: Secondary | ICD-10-CM | POA: Diagnosis not present

## 2019-09-17 DIAGNOSIS — N952 Postmenopausal atrophic vaginitis: Secondary | ICD-10-CM

## 2019-09-17 DIAGNOSIS — R6889 Other general symptoms and signs: Secondary | ICD-10-CM | POA: Diagnosis not present

## 2019-09-17 DIAGNOSIS — E041 Nontoxic single thyroid nodule: Secondary | ICD-10-CM

## 2019-09-17 DIAGNOSIS — G43109 Migraine with aura, not intractable, without status migrainosus: Secondary | ICD-10-CM

## 2019-09-17 DIAGNOSIS — R0989 Other specified symptoms and signs involving the circulatory and respiratory systems: Secondary | ICD-10-CM

## 2019-09-17 DIAGNOSIS — Z79899 Other long term (current) drug therapy: Secondary | ICD-10-CM | POA: Diagnosis not present

## 2019-09-17 DIAGNOSIS — Z0001 Encounter for general adult medical examination with abnormal findings: Secondary | ICD-10-CM | POA: Diagnosis not present

## 2019-09-17 DIAGNOSIS — Z0000 Encounter for general adult medical examination without abnormal findings: Secondary | ICD-10-CM

## 2019-09-17 DIAGNOSIS — E782 Mixed hyperlipidemia: Secondary | ICD-10-CM

## 2019-09-17 DIAGNOSIS — Z8619 Personal history of other infectious and parasitic diseases: Secondary | ICD-10-CM

## 2019-09-17 DIAGNOSIS — I10 Essential (primary) hypertension: Secondary | ICD-10-CM

## 2019-09-17 DIAGNOSIS — E538 Deficiency of other specified B group vitamins: Secondary | ICD-10-CM

## 2019-09-17 NOTE — Patient Instructions (Addendum)
Your LDL is the bad cholesterol that can lead to heart attack and stroke. To lower your number you can decrease your fatty foods, red meat, cheese, milk and increase fiber like whole grains and veggies. You can also add a fiber supplement like Citracel or Benefiber, these do not cause gas and bloating and are safe to use.    May add on zetia since unable to increase statin.    cardiac calcium score, this will help [place] quantify how much calcification if any you have in your coronary arteries and can help [place] decide if we need to do aggressive medical management like cholesterol medication.  It will be done at [facility] on [address] in [place] It will be 99 dollars self pay.     WOMEN AND HEART ATTACKS  Being a woman you may not have the typical symptoms of a heart attack.  You may not have any pain OR you may have atypical pain such as jaw pain, upper back pain, arm pain, "my bra feels to tight" and you will often have symptoms with it like below.  Symptoms for a heart attack will likely occur when you exert your self or exercise and include: Shortness of breath Sweating Nausea Dizziness Fast or irregular heart beats Fatigue   It makes me feel better if my patients get their heart rate up with exercise once or twice a week and pay close attention to your body. If there is ANY change in your exercise capacity or if you have symptoms above, please STOP and call 911 or call to come to the office.   Here is some information to help you keep your heart healthy: Move it! - Aim for 30 mins of activity every day. Take it slowly at first. Talk to [place] before starting any new exercise program.   Lose it.  -Body Mass Index (BMI) can indicate if you need to lose weight. A healthy range is 18.5-24.9. For a BMI calculator, go to [employer]  Waist Management -Excess abdominal fat is a risk factor for heart disease, diabetes, asthma, stroke and more. Ideal waist circumference is less than 35" for  women and less than 40" for men.   Eat Right -focus on fruits, vegetables, whole grains, and meals you make yourself. Avoid foods with trans fat and high sugar/sodium content.   Snooze or Snore? - Loud snoring can be a sign of sleep apnea, a significant risk factor for high blood pressure, heart attach, stroke, and heart arrhythmias.  Kick the habit -Quit Smoking! Avoid second hand smoke. A single cigarette raises your blood pressure for 20 mins and increases the risk of heart attack and stroke for the next 24 hours.   Are Aspirin and Supplements right for you? -Add ENTERIC COATED low dose 81 mg Aspirin daily OR can do every other day if you have easy bruising to protect your heart and head. As well as to reduce risk of Colon Cancer by 20 %, Skin Cancer by 26 % , Melanoma by 46% and Pancreatic cancer by 60%  Say "No to Stress -There may be little you can do about problems that cause stress. However, techniques such as long walks, meditation, and exercise can help you manage it.   Start Now! - Make changes one at a time and set reasonable goals to increase your likelihood of success.

## 2019-09-20 LAB — COMPLETE METABOLIC PANEL WITH GFR
AG Ratio: 2.8 (calc) — ABNORMAL HIGH (ref 1.0–2.5)
ALT: 28 U/L (ref 6–29)
AST: 19 U/L (ref 10–35)
Albumin: 5.1 g/dL (ref 3.6–5.1)
Alkaline phosphatase (APISO): 129 U/L (ref 37–153)
BUN: 14 mg/dL (ref 7–25)
CO2: 27 mmol/L (ref 20–32)
Calcium: 10.2 mg/dL (ref 8.6–10.4)
Chloride: 104 mmol/L (ref 98–110)
Creat: 0.88 mg/dL (ref 0.50–0.99)
GFR, Est African American: 79 mL/min/{1.73_m2} (ref >=60)
GFR, Est Non African American: 68 mL/min/{1.73_m2} (ref >=60)
Globulin: 1.8 g/dL (calc) — ABNORMAL LOW (ref 1.9–3.7)
Glucose, Bld: 104 mg/dL — ABNORMAL HIGH (ref 65–99)
Potassium: 4.9 mmol/L (ref 3.5–5.3)
Sodium: 141 mmol/L (ref 135–146)
Total Bilirubin: 0.6 mg/dL (ref 0.2–1.2)
Total Protein: 6.9 g/dL (ref 6.1–8.1)

## 2019-09-20 LAB — LIPID PANEL
Cholesterol: 231 mg/dL — ABNORMAL HIGH (ref <200)
HDL: 60 mg/dL (ref >=50)
LDL Cholesterol (Calc): 139 mg/dL (calc) — ABNORMAL HIGH
Non-HDL Cholesterol (Calc): 171 mg/dL (calc) — ABNORMAL HIGH (ref <130)
Total CHOL/HDL Ratio: 3.9 (calc) (ref <5.0)
Triglycerides: 187 mg/dL — ABNORMAL HIGH (ref <150)

## 2019-09-20 LAB — VITAMIN D 25 HYDROXY (VIT D DEFICIENCY, FRACTURES): Vit D, 25-Hydroxy: 57 ng/mL (ref 30–100)

## 2019-09-20 LAB — CBC WITH DIFFERENTIAL/PLATELET
Absolute Monocytes: 395 cells/uL (ref 200–950)
Basophils Absolute: 61 cells/uL (ref 0–200)
Basophils Relative: 1.3 %
Eosinophils Absolute: 99 cells/uL (ref 15–500)
Eosinophils Relative: 2.1 %
HCT: 46.7 % — ABNORMAL HIGH (ref 35.0–45.0)
Hemoglobin: 15.6 g/dL — ABNORMAL HIGH (ref 11.7–15.5)
Lymphs Abs: 1166 cells/uL (ref 850–3900)
MCH: 30.5 pg (ref 27.0–33.0)
MCHC: 33.4 g/dL (ref 32.0–36.0)
MCV: 91.4 fL (ref 80.0–100.0)
MPV: 9.5 fL (ref 7.5–12.5)
Monocytes Relative: 8.4 %
Neutro Abs: 2980 cells/uL (ref 1500–7800)
Neutrophils Relative %: 63.4 %
Platelets: 266 10*3/uL (ref 140–400)
RBC: 5.11 10*6/uL — ABNORMAL HIGH (ref 3.80–5.10)
RDW: 11.8 % (ref 11.0–15.0)
Total Lymphocyte: 24.8 %
WBC: 4.7 10*3/uL (ref 3.8–10.8)

## 2019-09-20 LAB — THYROGLOBULIN ANTIBODY: Thyroglobulin Ab: <1 IU/mL (ref <=1)

## 2019-09-20 LAB — THYROID PEROXIDASE ANTIBODY: Thyroperoxidase Ab SerPl-aCnc: <1 IU/mL (ref <9)

## 2019-09-20 LAB — METHYLMALONIC ACID, SERUM: Methylmalonic Acid, Quant: 93 nmol/L (ref 87–318)

## 2019-09-20 LAB — TSH: TSH: 2.5 mIU/L (ref 0.40–4.50)

## 2019-09-20 LAB — MAGNESIUM: Magnesium: 2 mg/dL (ref 1.5–2.5)

## 2019-09-20 LAB — T4, FREE: Free T4: 1.2 ng/dL (ref 0.8–1.8)

## 2019-09-21 ENCOUNTER — Other Ambulatory Visit: Payer: Self-pay | Admitting: Internal Medicine

## 2019-10-17 MED ORDER — VALACYCLOVIR HCL 1 G PO TABS: 1000.0000 mg | ORAL_TABLET | Freq: Every day | ORAL | 1 refills | Status: DC

## 2020-02-12 LAB — HM DIABETES EYE EXAM

## 2020-03-10 ENCOUNTER — Encounter: Payer: Self-pay | Admitting: Adult Health Nurse Practitioner

## 2020-03-10 ENCOUNTER — Other Ambulatory Visit: Payer: Self-pay

## 2020-03-10 ENCOUNTER — Ambulatory Visit: Payer: Medicare Other | Admitting: Adult Health Nurse Practitioner

## 2020-03-10 VITALS — BP 120/76 | HR 72 | Temp 97.7°F | Ht 67.0 in | Wt 147.0 lb

## 2020-03-10 DIAGNOSIS — R300 Dysuria: Secondary | ICD-10-CM

## 2020-03-10 DIAGNOSIS — J0140 Acute pansinusitis, unspecified: Secondary | ICD-10-CM

## 2020-03-10 DIAGNOSIS — E538 Deficiency of other specified B group vitamins: Secondary | ICD-10-CM

## 2020-03-10 DIAGNOSIS — E559 Vitamin D deficiency, unspecified: Secondary | ICD-10-CM

## 2020-03-10 DIAGNOSIS — Z136 Encounter for screening for cardiovascular disorders: Secondary | ICD-10-CM | POA: Diagnosis not present

## 2020-03-10 DIAGNOSIS — Z8619 Personal history of other infectious and parasitic diseases: Secondary | ICD-10-CM

## 2020-03-10 DIAGNOSIS — Z0000 Encounter for general adult medical examination without abnormal findings: Secondary | ICD-10-CM

## 2020-03-10 DIAGNOSIS — E2839 Other primary ovarian failure: Secondary | ICD-10-CM

## 2020-03-10 DIAGNOSIS — R198 Other specified symptoms and signs involving the digestive system and abdomen: Secondary | ICD-10-CM

## 2020-03-10 DIAGNOSIS — I10 Essential (primary) hypertension: Secondary | ICD-10-CM | POA: Diagnosis not present

## 2020-03-10 DIAGNOSIS — E782 Mixed hyperlipidemia: Secondary | ICD-10-CM

## 2020-03-10 DIAGNOSIS — E041 Nontoxic single thyroid nodule: Secondary | ICD-10-CM

## 2020-03-10 DIAGNOSIS — Z6822 Body mass index (BMI) 22.0-22.9, adult: Secondary | ICD-10-CM

## 2020-03-10 DIAGNOSIS — G43109 Migraine with aura, not intractable, without status migrainosus: Secondary | ICD-10-CM

## 2020-03-10 DIAGNOSIS — R7309 Other abnormal glucose: Secondary | ICD-10-CM

## 2020-03-10 DIAGNOSIS — R09A2 Foreign body sensation, throat: Secondary | ICD-10-CM

## 2020-03-10 DIAGNOSIS — N952 Postmenopausal atrophic vaginitis: Secondary | ICD-10-CM

## 2020-03-10 DIAGNOSIS — Z79899 Other long term (current) drug therapy: Secondary | ICD-10-CM

## 2020-03-10 DIAGNOSIS — R0989 Other specified symptoms and signs involving the circulatory and respiratory systems: Secondary | ICD-10-CM

## 2020-03-10 MED ORDER — DEXAMETHASONE 4 MG PO TABS: ORAL_TABLET | ORAL | 1 refills | Status: DC

## 2020-03-10 NOTE — Patient Instructions (Addendum)
   Try taking Zyrtec (Cetirizine) OR Xyzal (Levocetirazine) every night to help with the drainage in the back of your throat.   Continue the Mucinex every 12hours while you are having symptoms.    GENERAL HEALTH GOALS  Know what a healthy weight is for you (roughly BMI <25) and aim to maintain this  Aim for 7+ servings of fruits and vegetables daily  70-80+ fluid ounces of water or unsweet tea for healthy kidneys  Limit to max 1 drink of alcohol per day; avoid smoking/tobacco  Limit animal fats in diet for cholesterol and heart health - choose grass fed whenever available  Avoid highly processed foods, and foods high in saturated/trans fats  Aim for low stress - take time to unwind and care for your mental health  Aim for 150 min of moderate intensity exercise weekly for heart health, and weights twice weekly for bone health  Aim for 7-9 hours of sleep daily

## 2020-03-10 NOTE — Progress Notes (Addendum)
COMPLETE PHSYICAL  Assessment and Plan:  Complete physical Yearly Declines COVID vaccine- states can not trust due to agenda of people  Essential hypertension - continue medications: Norvasc 2.5mg  and spironolactone 100mg  DASH diet, exercise and monitor at home. Call if greater than 130/80.  -     CBC with Diff -     COMPLETE METABOLIC PANEL WITH GFR -     TSH  Migraine with aura and without status migrainosus, not intractable Avoid triggers, monitor  Mixed hyperlipidemia Continue medications: Rosuvastatin 40mg  Discussed dietary and exercise modifications Low fat diet -     Lipid Profile  Postmenopausal atrophic vaginitis Monitor- following with Dr. Sabra Heck  Vitamin D deficiency Continue supplementation to maintain goal of 70-100 Taking Vitamin D 10,000 IU daily -     Vitamin D (25 hydroxy)  History of cold sores Monitor  Thyroid nodule No further monitoring at this time  B12 deficiency -     Methylmalonic acid, serum  BMI 23.0-23.9 Discussed dietary and exercise modifications  Medication management -     Magnesium    Discussed med's effects and SE's. Screening labs and tests as requested with regular follow-up as recommended.  Further disposition pending results if labs check today. Discussed med's effects and SE's.   Over 30 minutes of face to face interview, exam, counseling, chart review, and critical decision making was performed.   Future Appointments  Date Time Provider [place]  09/20/2020  9:30 AM Garnet Sierras, NP [facility] None  03/14/2021  9:00 AM Garnet Sierras, NP [facility] None      HPI  70 y.o. female  presents for complete physical.  She has had congestion. She reports sh was exposed to [place] while at church.  Reports her husband had it first.  She continues to have chest congestion and sinus drainage. She is 7 days post exposure.  No further fever.  She has been monitoring her O2 saturation at home.   BMI is Body mass  index is 23.02 kg/m., she is working on diet and exercise. Wt Readings from Last 3 Encounters:  03/10/20 147 lb (66.7 kg)  09/17/19 144 lb (65.3 kg)  09/09/19 146 lb (66.2 kg)    She has been following with Dr. Silverio Decamp for dysphagia/glubulus sensation.  03/2019 EGD: Normal esophaus, TTS dilator 2mm, esophageal biopsies negative for EOE and gastric biopsies negative for H.pylori Normal manometry and Ph study  She continues to have some mild dysphagia, but improved, with very specific foods, not happening as often.   Her blood pressure has been controlled at home, today their BP is BP: 120/76.  She does workout. She denies chest pain, shortness of breath, dizziness.   She is on cholesterol medication 40mg  1/2 a pill 3 x a week and denies myalgias- had grade 2 muscle tear with higher dose. Her cholesterol is at goal. The cholesterol last visit was:  Lab Results  Component Value Date   CHOL 231 (H) 09/17/2019   HDL 60 09/17/2019   LDLCALC 139 (H) 09/17/2019   TRIG 187 (H) 09/17/2019   CHOLHDL 3.9 09/17/2019    Last A1C in the office was:  Lab Results  Component Value Date   HGBA1C 5.6 08/28/2018   Patient is on Vitamin D supplement.   Lab Results  Component Value Date   VD25OH 57 09/17/2019        Current Medications:   Current Outpatient Medications (Endocrine & Metabolic):  .  dexamethasone (DECADRON) 4 MG tablet, Take 1 tab 3  x day - 3 days, then 2 x day - 3 days, then 1 tab daily  Current Outpatient Medications (Cardiovascular):  .  amLODipine (NORVASC) 2.5 MG tablet, TAKE 1 TABLET BY MOUTH  DAILY FOR BLOOD PRESSURE .  rosuvastatin (CRESTOR) 40 MG tablet, TAKE 1 TABLET BY MOUTH  DAILY FOR CHOLESTEROL (Patient taking differently: 1/2 tablet Mon,  Wed, Fri) .  spironolactone (ALDACTONE) 100 MG tablet, TAKE 2 TABLETS BY MOUTH  DAILY  Current Outpatient Medications (Respiratory):  Marland Kitchen  Cetirizine HCl (ZYRTEC ALLERGY PO), Take by mouth as needed.  .  diphenhydrAMINE  (BENADRYL) 25 MG tablet, Take 25 mg by mouth as needed.  .  fexofenadine (ALLEGRA) 180 MG tablet, Take 180 mg by mouth daily. Alternating with Zyrtec  Current Outpatient Medications (Analgesics):  .  aspirin EC 81 MG tablet, Take 81 mg by mouth daily. .  rizatriptan (MAXALT) 10 MG tablet, Take 1 tablet (10 mg total) by mouth once as needed for migraine. May repeat in 2 hours if needed   Current Outpatient Medications (Other):  Marland Kitchen  B Complex Vitamins (VITAMIN B COMPLEX PO), Take by mouth. .  Cholecalciferol (VITAMIN D PO), Take 10,000 Units by mouth daily.  .  COD LIVER OIL PO, Take by mouth daily. .  Coenzyme Q10 100 MG TABS, Take by mouth daily. Marland Kitchen  estradiol (ESTRACE) 0.1 MG/GM vaginal cream, 1 gram pv twice weekly .  Estradiol 10 MCG TABS vaginal tablet, 1 tab pv twice weekly .  MAGNESIUM PO, Take 800 Units by mouth daily.  Marland Kitchen  MELATONIN PO, Take by mouth as needed. Reported on 08/13/2015 .  Omega-3 Fatty Acids (FISH OIL PO), Take 1,200 mg by mouth.  .  Probiotic Product (PROBIOTIC DAILY PO), Take by mouth daily. .  valACYclovir (VALTREX) 1000 MG tablet, Take 1 tablet (1,000 mg total) by mouth daily. Marland Kitchen  zinc gluconate 50 MG tablet, Take 50 mg by mouth daily. Marland Kitchen  GLUCOSAMINE-CHONDROITIN PO, Take 1,500 Units by mouth daily.  (Patient not taking: Reported on 03/10/2020) .  Multiple Vitamins-Minerals (HAIR/SKIN/NAILS/BIOTIN) TABS, Take 3 tablets by mouth daily. (Patient not taking: Reported on 03/10/2020)  Health Maintenance:   Immunization History  Administered Date(s) Administered  . Influenza Split 12/23/2013, 12/24/2014  . Influenza Whole 12/16/2012  . Influenza, High Dose Seasonal PF 12/15/2016, 11/18/2017, 12/24/2018  . Influenza,inj,quad, With Preservative 12/24/2015  . Influenza-Unspecified 12/20/2016  . Pneumococcal Conjugate-13 12/15/2016  . Pneumococcal Polysaccharide-23 08/24/2004  . Tdap 11/28/2010  . Zoster 01/19/2012  . Zoster Recombinat (Shingrix) 12/27/2016, 04/03/2017    Health Maintenance  Topic Date Due  . Hepatitis C Screening  Never done  . INFLUENZA VACCINE  10/05/2019  . TETANUS/TDAP  11/27/2020  . MAMMOGRAM  05/18/2021  . COLONOSCOPY (Pts 45-48yrs Insurance coverage will need to be confirmed)  02/04/2025  . DEXA SCAN  Completed  . PNA vac Low Risk Adult  Completed  . COVID-19 Vaccine  Discontinued    Tetanus:  2012 Pneumovax: 2006 Prevnar 13: 2018 Flu vaccine: 2020 Zostavax: 2013 Shingrix: 2018 Hepatits A and B  PAP 02/2018 Dr. Sabra Heck, Due MGM: 05/2019 DEXA: 01/21/14, order [placed Dr Sabra Heck Colonoscopy: 2016 was seeing Dr. Peggye Ley Stress test 2006 US transvaginal 2014 US thyroid 2019  IMPRESSION: 1. Similar findings of multinodular goiter. No definitive worrisome new or enlarging thyroid nodules. 2. Previously identified thyroid nodules appear similar to the 07/2012 examination. Stability for 5 years is indicative of benign etiology and as such, percutaneous sampling and/or continued dedicated follow-up of any of  the nodules is not recommended.  Opthalmology:  Dr. Gershon Mussel, and Dr. Maple Hudson  Patient Care Team: Lucky Cowboy, MD as PCP - General (Internal Medicine) Jerene Bears, MD as Consulting Physician (Gynecology) Luis Abed, MD as Consulting Physician (Cardiology) Louis Meckel, MD (Inactive) as Consulting Physician (Gastroenterology) Mateo Flow, MD as Consulting Physician (Ophthalmology) Marvis Repress, MD as Referring Physician (Ophthalmology) Trey Sailors, MD as Attending Physician (Neurosurgery) [place], Elvin So, MD as Referring Physician (Dermatology) Eldred Manges, MD as Consulting Physician (Orthopedic Surgery)  Allergies:  Allergies  Allergen Reactions  . Bactrim [Sulfamethoxazole-Trimethoprim] Itching  . Lipitor [Atorvastatin] Other (See Comments)    headache  . Monistat [Miconazole] Itching    burning    Medical History:  Past Medical History:  Diagnosis Date  . AC  (acromioclavicular) joint bone spurs   . Allergy   . Anemia   . Anxiety   . Degenerative arthritis   . Depression   . GERD (gastroesophageal reflux disease)   . Goiter    multi nodular   . History of anemia   . History of PCOS   . Hyperlipidemia   . Hypertension   . Nevus of left eye   . Nuclear cataract 06/13/2011  . PCOS (polycystic ovarian syndrome)   . Status post intraocular lens implant 06/13/2011   Allergies Allergies  Allergen Reactions  . Bactrim [Sulfamethoxazole-Trimethoprim] Itching  . Lipitor [Atorvastatin] Other (See Comments)    headache  . Monistat [Miconazole] Itching    burning    SURGICAL HISTORY She  has a past surgical history that includes Tubal ligation (1995); Tonsillectomy and adenoidectomy (1959); Bunionectomy (Bilateral, 2006); Toe Surgery (Left, 2006); Hernia repair (Right, 1959); Cataract extraction (Left); Colonoscopy; Esophageal manometry (N/A, 04/16/2019); PH impedance study (N/A, 04/16/2019); and 24 hour ph study (04/16/2019). FAMILY HISTORY Her family history includes Atrial fibrillation in her mother; Cancer (age of onset: 20) in her brother; Congestive Heart Failure in her mother; Dementia in her mother; Emphysema in her maternal grandfather; Heart disease in her father; Heart disease (age of onset: 85) in her brother; Heart disease (age of onset: 15) in her mother; Hyperlipidemia in her brother and mother; Hypertension in her mother; Liver cancer in her father; Lung cancer in her father; Other in her daughter; Polycystic ovary syndrome in her daughter; Stroke in her paternal grandmother; Thyroid disease in her daughter and mother. SOCIAL HISTORY She  reports that she has never smoked. She has never used smokeless tobacco. She reports current alcohol use. She reports that she does not use drugs.  Review of Systems: Review of Systems  Constitutional: Negative for chills, diaphoresis, fever, malaise/fatigue and weight loss.  HENT: Positive for  congestion. Negative for ear pain and sore throat.   Eyes: Negative.  Negative for blurred vision, double vision and photophobia.  Respiratory: Negative for cough, shortness of breath and wheezing.   Cardiovascular: Negative for chest pain, palpitations and leg swelling.  Gastrointestinal: Negative for abdominal pain, blood in stool, constipation, diarrhea, heartburn, melena, nausea and vomiting.  Genitourinary: Negative.   Skin: Negative.  Negative for rash.  Neurological: Negative for dizziness, sensory change, loss of consciousness and headaches.  Psychiatric/Behavioral: Negative for depression. The patient is not nervous/anxious and does not have insomnia.     Physical Exam: Estimated body mass index is 23.02 kg/m as calculated from the following:   Height as of this encounter: 5\' 7"  (1.702 m).   Weight as of this encounter: 147 lb (66.7 kg). BP 120/76  Pulse 72   Temp 97.7 F (36.5 C)   Ht 5\' 7"  (1.702 m)   Wt 147 lb (66.7 kg)   LMP 12/05/2010   SpO2 99%   BMI 23.02 kg/m     Wt Readings from Last 3 Encounters:  03/10/20 147 lb (66.7 kg)  09/17/19 144 lb (65.3 kg)  09/09/19 146 lb (66.2 kg)   General Appearance: Well norrished well developed, in no apparent distress.  Eyes: PERRLA, EOMs, conjunctiva no swelling or erythema ENT/Mouth: Ear canals normal without obstruction, swelling, erythema, or discharge.  TMs normal bilaterally with no erythema, bulging, retraction, or loss of landmark.  Oropharynx moist and clear with no exudate, erythema, or swelling.   Neck: Supple, thyroid normal. No bruits.  No cervical adenopathy Respiratory: Respiratory effort normal, Breath sounds clear A&P without wheeze, rhonchi, rales.   Cardio: RRR without murmurs, rubs or gallops. Brisk peripheral pulses without edema.  Chest: symmetric, with normal excursions Breasts: defer Abdomen: Soft, nontender, no guarding, no rebound, hernias, masses, or organomegaly.  Lymphatics: Non tender without  lymphadenopathy.  Genitourinary: Deferred to Gyn for yearly checkups Musculoskeletal: Full ROM all peripheral extremities,5/5 strength, and normal gait.  Skin: Warm, dry without rashes, lesions, ecchymosis. Lipoma right thigh.  Neuro: Awake and oriented X 3, Cranial nerves intact, reflexes equal bilaterally. Normal muscle tone, no cerebellar symptoms. Sensation intact.  Psych:  normal affect, Insight and Judgment appropriate.     Garnet Sierras, Laqueta Jean, DNP [facility] 03/10/2020  9:40 AM

## 2020-03-11 LAB — URINALYSIS W MICROSCOPIC + REFLEX CULTURE
Bacteria, UA: NONE SEEN /HPF
Bilirubin Urine: NEGATIVE
Glucose, UA: NEGATIVE
Hgb urine dipstick: NEGATIVE
Hyaline Cast: NONE SEEN /LPF
Leukocyte Esterase: NEGATIVE
Nitrites, Initial: NEGATIVE
Protein, ur: NEGATIVE
RBC / HPF: NONE SEEN /HPF (ref 0–2)
Specific Gravity, Urine: 1.022 (ref 1.001–1.03)
WBC, UA: NONE SEEN /HPF (ref 0–5)
pH: <=5 (ref 5.0–8.0)

## 2020-03-11 LAB — COMPLETE METABOLIC PANEL WITH GFR
AG Ratio: 2.6 (calc) — ABNORMAL HIGH (ref 1.0–2.5)
ALT: 18 U/L (ref 6–29)
AST: 18 U/L (ref 10–35)
Albumin: 4.5 g/dL (ref 3.6–5.1)
Alkaline phosphatase (APISO): 109 U/L (ref 37–153)
BUN: 18 mg/dL (ref 7–25)
CO2: 31 mmol/L (ref 20–32)
Calcium: 9.4 mg/dL (ref 8.6–10.4)
Chloride: 101 mmol/L (ref 98–110)
Creat: 0.87 mg/dL (ref 0.50–0.99)
GFR, Est African American: 79 mL/min/{1.73_m2} (ref >=60)
GFR, Est Non African American: 68 mL/min/{1.73_m2} (ref >=60)
Globulin: 1.7 g/dL (calc) — ABNORMAL LOW (ref 1.9–3.7)
Glucose, Bld: 108 mg/dL — ABNORMAL HIGH (ref 65–99)
Potassium: 4.6 mmol/L (ref 3.5–5.3)
Sodium: 140 mmol/L (ref 135–146)
Total Bilirubin: 0.4 mg/dL (ref 0.2–1.2)
Total Protein: 6.2 g/dL (ref 6.1–8.1)

## 2020-03-11 LAB — LIPID PANEL
Cholesterol: 198 mg/dL (ref <200)
HDL: 45 mg/dL — ABNORMAL LOW (ref >=50)
LDL Cholesterol (Calc): 121 mg/dL (calc) — ABNORMAL HIGH
Non-HDL Cholesterol (Calc): 153 mg/dL (calc) — ABNORMAL HIGH (ref <130)
Total CHOL/HDL Ratio: 4.4 (calc) (ref <5.0)
Triglycerides: 200 mg/dL — ABNORMAL HIGH (ref <150)

## 2020-03-11 LAB — CBC WITH DIFFERENTIAL/PLATELET
Absolute Monocytes: 442 cells/uL (ref 200–950)
Basophils Absolute: 71 cells/uL (ref 0–200)
Basophils Relative: 1.5 %
Eosinophils Absolute: 240 cells/uL (ref 15–500)
Eosinophils Relative: 5.1 %
HCT: 43.8 % (ref 35.0–45.0)
Hemoglobin: 14.5 g/dL (ref 11.7–15.5)
Lymphs Abs: 1062 cells/uL (ref 850–3900)
MCH: 30.1 pg (ref 27.0–33.0)
MCHC: 33.1 g/dL (ref 32.0–36.0)
MCV: 91.1 fL (ref 80.0–100.0)
MPV: 9.6 fL (ref 7.5–12.5)
Monocytes Relative: 9.4 %
Neutro Abs: 2886 cells/uL (ref 1500–7800)
Neutrophils Relative %: 61.4 %
Platelets: 274 10*3/uL (ref 140–400)
RBC: 4.81 10*6/uL (ref 3.80–5.10)
RDW: 11.9 % (ref 11.0–15.0)
Total Lymphocyte: 22.6 %
WBC: 4.7 10*3/uL (ref 3.8–10.8)

## 2020-03-11 LAB — TSH: TSH: 2.88 mIU/L (ref 0.40–4.50)

## 2020-03-11 LAB — NO CULTURE INDICATED

## 2020-03-11 LAB — HEMOGLOBIN A1C
Hgb A1c MFr Bld: 5.8 % of total Hgb — ABNORMAL HIGH (ref <5.7)
Mean Plasma Glucose: 120 mg/dL
eAG (mmol/L): 6.6 mmol/L

## 2020-03-11 LAB — VITAMIN D 25 HYDROXY (VIT D DEFICIENCY, FRACTURES): Vit D, 25-Hydroxy: 66 ng/mL (ref 30–100)

## 2020-03-16 ENCOUNTER — Encounter: Payer: Self-pay | Admitting: *Deleted

## 2020-04-06 ENCOUNTER — Other Ambulatory Visit: Payer: Self-pay

## 2020-04-06 DIAGNOSIS — Z20822 Contact with and (suspected) exposure to covid-19: Secondary | ICD-10-CM

## 2020-04-07 ENCOUNTER — Other Ambulatory Visit (INDEPENDENT_AMBULATORY_CARE_PROVIDER_SITE_OTHER): Payer: Medicare Other

## 2020-04-07 ENCOUNTER — Other Ambulatory Visit: Payer: Self-pay

## 2020-04-07 DIAGNOSIS — Z20822 Contact with and (suspected) exposure to covid-19: Secondary | ICD-10-CM

## 2020-04-11 LAB — SARS-COV-2 SEMI-QUANTITATIVE TOTAL ANTIBODY, SPIKE: SARS COV2 AB, Total Spike Semi QN: <0.4 U/mL (ref <0.8)

## 2020-06-20 ENCOUNTER — Other Ambulatory Visit: Payer: Self-pay | Admitting: Obstetrics & Gynecology

## 2020-06-30 ENCOUNTER — Other Ambulatory Visit: Payer: Self-pay | Admitting: Obstetrics & Gynecology

## 2020-06-30 DIAGNOSIS — Z1231 Encounter for screening mammogram for malignant neoplasm of breast: Secondary | ICD-10-CM

## 2020-08-21 ENCOUNTER — Other Ambulatory Visit: Payer: Self-pay | Admitting: Adult Health

## 2020-08-31 ENCOUNTER — Encounter (HOSPITAL_BASED_OUTPATIENT_CLINIC_OR_DEPARTMENT_OTHER): Payer: Self-pay

## 2020-09-03 ENCOUNTER — Other Ambulatory Visit: Payer: Self-pay | Admitting: Obstetrics & Gynecology

## 2020-09-03 DIAGNOSIS — E2839 Other primary ovarian failure: Secondary | ICD-10-CM

## 2020-09-08 ENCOUNTER — Other Ambulatory Visit: Payer: Self-pay

## 2020-09-08 ENCOUNTER — Ambulatory Visit
Admission: RE | Admit: 2020-09-08 | Discharge: 2020-09-08 | Disposition: A | Discharge status: Home / Self Care | Payer: Medicare Other | Source: Ambulatory Visit | Attending: Obstetrics & Gynecology | Admitting: Obstetrics & Gynecology

## 2020-09-08 DIAGNOSIS — Z1231 Encounter for screening mammogram for malignant neoplasm of breast: Secondary | ICD-10-CM

## 2020-09-10 ENCOUNTER — Ambulatory Visit: Payer: Medicare Other

## 2020-09-18 NOTE — Progress Notes (Signed)
MEDICARE WELLNESS  Assessment and Plan:  Medicare wellness 1 year Declines COVID vaccine  Essential hypertension - continue medications, DASH diet, exercise and monitor at home. Call if greater than 130/80.  -     CBC with Diff -     COMPLETE METABOLIC PANEL WITH GFR -     TSH  Medication management -     Magnesium  Migraine with aura and without status migrainosus, not intractable Avoid triggers, monitor  Continue Maxalt as needed.  Mixed hyperlipidemia -     Lipid Profile check lipids decrease fatty foods increase activity.  Postmenopausal atrophic vaginitis Monitor- following with Dr. Sabra Heck  Vitamin D deficiency -     Vitamin D (25 hydroxy)  Thyroid nodule       -     TSH  B12 deficiency -     VIT b12   Discussed med's effects and SE's. Screening labs and tests as requested with regular follow-up as recommended. Future Appointments  Date Time Provider [place]  10/15/2020  9:15 AM Megan Salon, MD [facility]  02/17/2021 11:00 AM [facility] DX DEXA 1 [facility]  03/14/2021  9:00 AM Garnet Sierras, NP [facility] None  09/20/2021  9:30 AM Magda Bernheim, NP [facility] None    Plan:   During the course of the visit the patient was educated and counseled about appropriate screening and preventive services including:   Pneumococcal vaccine  Prevnar 13 Influenza vaccine Td vaccine Screening electrocardiogram Bone densitometry screening Colorectal cancer screening Diabetes screening Glaucoma screening Nutrition counseling  Advanced directives: requested    HPI  70 y.o. female  presents for a medicare wellness.  Has had some stress with her mom, had CHF/dementia, 51- now in assisted living but recently broke her food.   BMI is Body mass index is 23.47 kg/m., she is working on diet and exercise. Wt Readings from Last 3 Encounters:  09/20/20 145 lb 6.4 oz (66 kg)  03/10/20 147 lb (66.7 kg)  09/17/19 144 lb (65.3 kg)    Her blood  pressure has been controlled at home normal 100's/60's, today their BP is BP: 124/72.  She does workout. She denies chest pain, shortness of breath, dizziness.   She is on cholesterol medication 40mg  1/2 a pill 3 x a week and denies myalgias- had grade 2 muscle tear with higher dose. Her cholesterol is at goal. The cholesterol last visit was:  Lab Results  Component Value Date   CHOL 198 03/10/2020   HDL 45 (L) 03/10/2020   LDLCALC 121 (H) 03/10/2020   TRIG 200 (H) 03/10/2020   CHOLHDL 4.4 03/10/2020    Last A1C in the office was:  Lab Results  Component Value Date   HGBA1C 5.8 (H) 03/10/2020   Patient is on Vitamin D supplement.   Lab Results  Component Value Date   VD25OH 66 03/10/2020        Current Medications:    Current Outpatient Medications (Cardiovascular):    amLODipine (NORVASC) 2.5 MG tablet, Take  1 tablet  Daily  for BP   rosuvastatin (CRESTOR) 40 MG tablet, TAKE 1 TABLET BY MOUTH  DAILY FOR CHOLESTEROL (Patient taking differently: 1/2 tablet Mon,  Wed, Fri)   spironolactone (ALDACTONE) 100 MG tablet, TAKE 2 TABLETS BY MOUTH  DAILY  Current Outpatient Medications (Respiratory):    Cetirizine HCl (ZYRTEC ALLERGY PO), Take by mouth as needed.    diphenhydrAMINE (BENADRYL) 25 MG tablet, Take 25 mg by mouth as needed.  fexofenadine (ALLEGRA) 180 MG tablet, Take 180 mg by mouth daily. Alternating with Zyrtec  Current Outpatient Medications (Analgesics):    aspirin EC 81 MG tablet, Take 81 mg by mouth daily.   rizatriptan (MAXALT) 10 MG tablet, Take 1 tablet (10 mg total) by mouth once as needed for migraine. May repeat in 2 hours if needed   Current Outpatient Medications (Other):    B Complex Vitamins (VITAMIN B COMPLEX PO), Take by mouth.   Cholecalciferol (VITAMIN D PO), Take 10,000 Units by mouth daily.    COD LIVER OIL PO, Take by mouth daily.   Coenzyme Q10 100 MG TABS, Take by mouth daily.   estradiol (ESTRACE) 0.1 MG/GM vaginal cream, 1 gram pv twice  weekly   Estradiol 10 MCG TABS vaginal tablet, PLACE 1 INSERT VAGINALLY  TWICE WEEKLY   MAGNESIUM PO, Take 800 Units by mouth daily.    MELATONIN PO, Take by mouth as needed. Reported on 08/13/2015   Multiple Vitamins-Minerals (HAIR/SKIN/NAILS/BIOTIN) TABS, Take 3 tablets by mouth daily.   Omega-3 Fatty Acids (FISH OIL PO), Take 1,200 mg by mouth.    Probiotic Product (PROBIOTIC DAILY PO), Take by mouth daily.   valACYclovir (VALTREX) 1000 MG tablet, Take 1 tablet (1,000 mg total) by mouth daily.   zinc gluconate 50 MG tablet, Take 50 mg by mouth daily.  Health Maintenance:   Immunization History  Administered Date(s) Administered   Influenza Split 12/23/2013, 12/24/2014   Influenza Whole 12/16/2012   Influenza, High Dose Seasonal PF 12/15/2016, 11/18/2017, 12/24/2018   Influenza,inj,quad, With Preservative 12/24/2015   Influenza-Unspecified 12/20/2016   Pneumococcal Conjugate-13 12/15/2016   Pneumococcal Polysaccharide-23 08/24/2004   Tdap 11/28/2010   Zoster Recombinat (Shingrix) 12/27/2016, 04/03/2017   Zoster, Live 01/19/2012   Tetanus:  2012 Pneumovax: 2006 Prevnar 13: 2018 Flu vaccine: 2020 Zostavax: 2013 Shingrix: 2018   PAP 10/ 2022 scheduled Dr. Sabra Heck MGM: 08/2020- normal pt reported  DEXA: 01/21/14 Colonoscopy: 2016 normal due 2026   Opthalmology:  Dr. Kathlen Mody 02/2020, prism correction for diagonal double vision being referred to [facility] for a specialist who focuses on this in adults Dentist appt 09/2020 Dr. Norman Herrlich  Patient Care Team: Unk Pinto, MD as PCP - General (Internal Medicine) Megan Salon, MD as Consulting Physician (Gynecology) Carlena Bjornstad, MD as Consulting Physician (Cardiology) Inda Castle, MD (Inactive) as Consulting Physician (Gastroenterology) Monna Fam, MD as Consulting Physician (Ophthalmology) Gerarda Fraction, MD as Referring Physician (Ophthalmology) Glenna Fellows, MD as Attending Physician (Neurosurgery) [place], Jennefer Bravo, MD as Referring Physician (Dermatology) Marybelle Killings, MD as Consulting Physician (Orthopedic Surgery)     Medical History:  Past Medical History:  Diagnosis Date   [facility] (acromioclavicular) joint bone spurs    Allergy    Anemia    Anxiety    Degenerative arthritis    Depression    GERD (gastroesophageal reflux disease)    Goiter    multi nodular    History of anemia    History of PCOS    Hyperlipidemia    Hypertension    Nevus of left eye    Nuclear cataract 06/13/2011   PCOS (polycystic ovarian syndrome)    Status post intraocular lens implant 06/13/2011   Allergies Allergies  Allergen Reactions   Bactrim [Sulfamethoxazole-Trimethoprim] Itching   Lipitor [Atorvastatin] Other (See Comments)    headache   Monistat [Miconazole] Itching    burning    SURGICAL HISTORY She  has a past surgical history that includes Tubal ligation (1995); Tonsillectomy  and adenoidectomy (1959); Bunionectomy (Bilateral, 2006); Toe Surgery (Left, 2006); Hernia repair (Right, 1959); Cataract extraction (Left); Colonoscopy; Esophageal manometry (N/A, 04/16/2019); PH impedance study (N/A, 04/16/2019); and 24 hour ph study (04/16/2019). FAMILY HISTORY Her family history includes Atrial fibrillation in her mother; Cancer (age of onset: 31) in her brother; Congestive Heart Failure in her mother; Dementia in her mother; Emphysema in her maternal grandfather; Heart disease in her father; Heart disease (age of onset: 71) in her brother; Heart disease (age of onset: 59) in her mother; Hyperlipidemia in her brother and mother; Hypertension in her mother; Liver cancer in her father; Lung cancer in her father; Other in her daughter; Polycystic ovary syndrome in her daughter; Stroke in her paternal grandmother; Thyroid disease in her daughter and mother. SOCIAL HISTORY She  reports that she has never smoked. She has never used smokeless tobacco. She reports current alcohol use. She reports that she does not use  drugs.  MEDICARE WELLNESS OBJECTIVES: Physical activity: Current Exercise Habits: Home exercise routine;Structured exercise class, Type of exercise: calisthenics;walking, Time (Minutes): 50, Frequency (Times/Week): 5, Weekly Exercise (Minutes/Week): 250, Intensity: Moderate, Exercise limited by: None identified Cardiac risk factors: Cardiac Risk Factors include: advanced age (>52men, >53 women);hypertension Depression/mood screen:   Depression screen [facility] 2/9 09/20/2020  Decreased Interest 0  Down, Depressed, Hopeless 0  PHQ - 2 Score 0    ADLs:  In your present state of health, do you have any difficulty performing the following activities: 09/20/2020 09/20/2020  Hearing? - N  Vision? N Y  Comment - double vision which is going to be corrected  Difficulty concentrating or making decisions? Y N  Comment double vision corrected -  Walking or climbing stairs? N N  Dressing or bathing? N N  Doing errands, shopping? N N  Some recent data might be hidden     Cognitive Testing  Alert? Yes  Normal Appearance?Yes  Oriented to person? Yes  Place? Yes   Time? Yes  Recall of three objects?  Yes  Can perform simple calculations? Yes  Displays appropriate judgment?Yes  Can read the correct time from a watch face?Yes  EOL planning: Does Patient Have a Medical Advance Directive?: No     Review of Systems: Review of Systems  Constitutional:  Negative for chills, diaphoresis, fever, malaise/fatigue and weight loss.  HENT:  Positive for congestion. Negative for ear pain, hearing loss and sore throat.   Eyes: Negative.  Negative for blurred vision and double vision.  Respiratory:  Negative for cough, shortness of breath and wheezing.   Cardiovascular:  Negative for chest pain, palpitations, orthopnea and leg swelling.  Gastrointestinal:  Positive for heartburn. Negative for abdominal pain, blood in stool, constipation, diarrhea, melena, nausea and vomiting.  Genitourinary: Negative.    Musculoskeletal:  Negative for falls, joint pain and myalgias.  Skin: Negative.  Negative for rash.  Neurological:  Negative for dizziness, tingling, tremors, sensory change, loss of consciousness and headaches.  Psychiatric/Behavioral:  Negative for depression, memory loss and suicidal ideas. The patient is not nervous/anxious and does not have insomnia.    Physical Exam: Estimated body mass index is 23.47 kg/m as calculated from the following:   Height as of this encounter: 5\' 6"  (1.676 m).   Weight as of this encounter: 145 lb 6.4 oz (66 kg). BP 124/72   Pulse 82   Temp (!) 97.3 F (36.3 C)   Ht 5\' 6"  (1.676 m)   Wt 145 lb 6.4 oz (66 kg)   LMP  12/05/2010   SpO2 97%   BMI 23.47 kg/m     Wt Readings from Last 3 Encounters:  09/20/20 145 lb 6.4 oz (66 kg)  03/10/20 147 lb (66.7 kg)  09/17/19 144 lb (65.3 kg)   General Appearance: Well norrished well developed, in no apparent distress.  Eyes: PERRLA, EOMs, conjunctiva no swelling or erythema ENT/Mouth: Ear canals normal without obstruction, swelling, erythema, or discharge.  TMs normal bilaterally with no erythema, bulging, retraction, or loss of landmark.  Oropharynx moist and clear with no exudate, erythema, or swelling.   Neck: Supple, thyroid normal. No bruits.  No cervical adenopathy Respiratory: Respiratory effort normal, Breath sounds clear A&P without wheeze, rhonchi, rales.   Cardio: RRR without murmurs, rubs or gallops. Brisk peripheral pulses without edema.  Chest: symmetric, with normal excursions Breasts: defer Abdomen: Soft, nontender, no guarding, no rebound, hernias, masses, or organomegaly.  Lymphatics: Non tender without lymphadenopathy.  Genitourinary: Deferred to Gyn for yearly checkups Musculoskeletal: Full ROM all peripheral extremities,5/5 strength, and normal gait.  Skin: Warm, dry without rashes, lesions, ecchymosis. Lipoma right thigh.  Neuro: Awake and oriented X 3, Cranial nerves intact, reflexes  equal bilaterally. Normal muscle tone, no cerebellar symptoms. Sensation intact.  Psych:  normal affect, Insight and Judgment appropriate.    Medicare Attestation I have personally reviewed: The patient's medical and social history Their use of alcohol, tobacco or illicit drugs Their current medications and supplements The patient's functional ability including ADLs,fall risks, home safety risks, cognitive, and hearing and visual impairment Diet and physical activities Evidence for depression or mood disorders  The patient's weight, height, BMI, and visual acuity have been recorded in the chart.  I have made referrals, counseling, and provided education to the patient based on review of the above and I have provided the patient with a written personalized care plan for preventive services.     Gabrelle Roca W Indiah Heyden 11:22 AM  Adult & Adolescent Internal Medicine

## 2020-09-20 ENCOUNTER — Ambulatory Visit: Payer: Medicare Other | Admitting: Nurse Practitioner

## 2020-09-20 ENCOUNTER — Ambulatory Visit: Payer: Medicare Other | Admitting: Adult Health Nurse Practitioner

## 2020-09-20 ENCOUNTER — Other Ambulatory Visit: Payer: Self-pay

## 2020-09-20 ENCOUNTER — Encounter: Payer: Self-pay | Admitting: Nurse Practitioner

## 2020-09-20 VITALS — BP 124/72 | HR 82 | Temp 97.3°F | Ht 66.0 in | Wt 145.4 lb

## 2020-09-20 DIAGNOSIS — E041 Nontoxic single thyroid nodule: Secondary | ICD-10-CM

## 2020-09-20 DIAGNOSIS — R6889 Other general symptoms and signs: Secondary | ICD-10-CM

## 2020-09-20 DIAGNOSIS — E559 Vitamin D deficiency, unspecified: Secondary | ICD-10-CM | POA: Diagnosis not present

## 2020-09-20 DIAGNOSIS — E782 Mixed hyperlipidemia: Secondary | ICD-10-CM

## 2020-09-20 DIAGNOSIS — Z0000 Encounter for general adult medical examination without abnormal findings: Secondary | ICD-10-CM

## 2020-09-20 DIAGNOSIS — Z6822 Body mass index (BMI) 22.0-22.9, adult: Secondary | ICD-10-CM

## 2020-09-20 DIAGNOSIS — G43109 Migraine with aura, not intractable, without status migrainosus: Secondary | ICD-10-CM

## 2020-09-20 DIAGNOSIS — E538 Deficiency of other specified B group vitamins: Secondary | ICD-10-CM

## 2020-09-20 DIAGNOSIS — Z0001 Encounter for general adult medical examination with abnormal findings: Secondary | ICD-10-CM

## 2020-09-20 DIAGNOSIS — I10 Essential (primary) hypertension: Secondary | ICD-10-CM

## 2020-09-20 DIAGNOSIS — N952 Postmenopausal atrophic vaginitis: Secondary | ICD-10-CM

## 2020-09-20 DIAGNOSIS — Z79899 Other long term (current) drug therapy: Secondary | ICD-10-CM

## 2020-09-20 DIAGNOSIS — R7309 Other abnormal glucose: Secondary | ICD-10-CM

## 2020-09-20 LAB — VITAMIN B12: Vitamin B-12: 1277 pg/mL — ABNORMAL HIGH (ref 200–1100)

## 2020-09-20 NOTE — Patient Instructions (Signed)

## 2020-09-22 LAB — TSH: TSH: 1.74 mIU/L (ref 0.40–4.50)

## 2020-09-22 LAB — CBC WITH DIFFERENTIAL/PLATELET
Absolute Monocytes: 319 cells/uL (ref 200–950)
Basophils Absolute: 50 cells/uL (ref 0–200)
Basophils Relative: 1.2 %
Eosinophils Absolute: 130 cells/uL (ref 15–500)
Eosinophils Relative: 3.1 %
HCT: 43.5 % (ref 35.0–45.0)
Hemoglobin: 14.6 g/dL (ref 11.7–15.5)
Lymphs Abs: 1046 cells/uL (ref 850–3900)
MCH: 30.5 pg (ref 27.0–33.0)
MCHC: 33.6 g/dL (ref 32.0–36.0)
MCV: 91 fL (ref 80.0–100.0)
MPV: 9.3 fL (ref 7.5–12.5)
Monocytes Relative: 7.6 %
Neutro Abs: 2654 cells/uL (ref 1500–7800)
Neutrophils Relative %: 63.2 %
Platelets: 263 10*3/uL (ref 140–400)
RBC: 4.78 10*6/uL (ref 3.80–5.10)
RDW: 12.7 % (ref 11.0–15.0)
Total Lymphocyte: 24.9 %
WBC: 4.2 10*3/uL (ref 3.8–10.8)

## 2020-09-22 LAB — COMPLETE METABOLIC PANEL WITH GFR
AG Ratio: 2.4 (calc) (ref 1.0–2.5)
ALT: 18 U/L (ref 6–29)
AST: 15 U/L (ref 10–35)
Albumin: 4.6 g/dL (ref 3.6–5.1)
Alkaline phosphatase (APISO): 99 U/L (ref 37–153)
BUN: 20 mg/dL (ref 7–25)
CO2: 30 mmol/L (ref 20–32)
Calcium: 9.6 mg/dL (ref 8.6–10.4)
Chloride: 102 mmol/L (ref 98–110)
Creat: 0.89 mg/dL (ref 0.50–1.05)
Globulin: 1.9 g/dL (calc) (ref 1.9–3.7)
Glucose, Bld: 100 mg/dL — ABNORMAL HIGH (ref 65–99)
Potassium: 4.7 mmol/L (ref 3.5–5.3)
Sodium: 140 mmol/L (ref 135–146)
Total Bilirubin: 0.5 mg/dL (ref 0.2–1.2)
Total Protein: 6.5 g/dL (ref 6.1–8.1)
eGFR: 71 mL/min/{1.73_m2} (ref >=60)

## 2020-09-22 LAB — VITAMIN D 25 HYDROXY (VIT D DEFICIENCY, FRACTURES): Vit D, 25-Hydroxy: 62 ng/mL (ref 30–100)

## 2020-09-22 LAB — LIPID PANEL
Cholesterol: 241 mg/dL — ABNORMAL HIGH (ref <200)
HDL: 58 mg/dL (ref >=50)
LDL Cholesterol (Calc): 153 mg/dL (calc) — ABNORMAL HIGH
Non-HDL Cholesterol (Calc): 183 mg/dL (calc) — ABNORMAL HIGH (ref <130)
Total CHOL/HDL Ratio: 4.2 (calc) (ref <5.0)
Triglycerides: 166 mg/dL — ABNORMAL HIGH (ref <150)

## 2020-09-22 LAB — HEMOGLOBIN A1C
Hgb A1c MFr Bld: 5.6 % of total Hgb (ref <5.7)
Mean Plasma Glucose: 114 mg/dL
eAG (mmol/L): 6.3 mmol/L

## 2020-09-22 LAB — MAGNESIUM: Magnesium: 2 mg/dL (ref 1.5–2.5)

## 2020-10-15 ENCOUNTER — Ambulatory Visit (INDEPENDENT_AMBULATORY_CARE_PROVIDER_SITE_OTHER): Payer: Medicare Other | Admitting: Obstetrics & Gynecology

## 2020-10-15 ENCOUNTER — Other Ambulatory Visit (HOSPITAL_COMMUNITY)
Admission: RE | Admit: 2020-10-15 | Discharge: 2020-10-15 | Disposition: A | Discharge status: Home / Self Care | Payer: Medicare Other | Source: Ambulatory Visit | Attending: Obstetrics & Gynecology | Admitting: Obstetrics & Gynecology

## 2020-10-15 ENCOUNTER — Encounter (HOSPITAL_BASED_OUTPATIENT_CLINIC_OR_DEPARTMENT_OTHER): Payer: Self-pay | Admitting: Obstetrics & Gynecology

## 2020-10-15 ENCOUNTER — Other Ambulatory Visit: Payer: Self-pay

## 2020-10-15 VITALS — BP 126/82 | HR 72 | Ht 66.0 in | Wt 144.2 lb

## 2020-10-15 DIAGNOSIS — Z87898 Personal history of other specified conditions: Secondary | ICD-10-CM

## 2020-10-15 DIAGNOSIS — Z8742 Personal history of other diseases of the female genital tract: Secondary | ICD-10-CM

## 2020-10-15 DIAGNOSIS — N760 Acute vaginitis: Secondary | ICD-10-CM

## 2020-10-15 DIAGNOSIS — Z9189 Other specified personal risk factors, not elsewhere classified: Secondary | ICD-10-CM | POA: Diagnosis not present

## 2020-10-15 DIAGNOSIS — Z780 Asymptomatic menopausal state: Secondary | ICD-10-CM | POA: Diagnosis not present

## 2020-10-15 DIAGNOSIS — B002 Herpesviral gingivostomatitis and pharyngotonsillitis: Secondary | ICD-10-CM

## 2020-10-15 DIAGNOSIS — Z872 Personal history of diseases of the skin and subcutaneous tissue: Secondary | ICD-10-CM

## 2020-10-15 MED ORDER — VALACYCLOVIR HCL 1 G PO TABS: ORAL_TABLET | ORAL | 1 refills | Status: DC

## 2020-10-15 MED ORDER — ESTRADIOL 0.1 MG/GM VA CREA: TOPICAL_CREAM | VAGINAL | 1 refills | Status: DC

## 2020-10-15 NOTE — Progress Notes (Signed)
70 y.o. [id number] Married White or Caucasian female here for breast and pelvic exam.  Mother had her 62th birthday.  She is in assisted living but doing well.    I am also following her for menopausal symptoms.  Using vaginal estrogen tablet and vaginal estrogen cream to help.  This does help symptoms and desires to continue.   Did some recurrent vaginitis issues in the last couple of years.  Thinks she has some symptoms today.  Would like testing.  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  She does continue to have symptoms so uses.  Has been taking 1gm bid so correct dosing discussed.  Will need new rx.  Denies vaginal bleeding.  Patient's last menstrual period was 12/05/2010.          Sexually active: Yes.    H/O STD:  oral HSV  Health Maintenance: PCP:  Dr. Melford Aase and midlevel providers in office.  Last wellness appt was 09/2020.  Did blood work at that appt:  yes Vaccines are up to date:  yes, tetanus due later this year Colonoscopy:  02/2015, follow up 10 years MMG:  09/08/2020 Negative BMD:  01/21/2014, is scheduled for 02/2021 Last pap smear:  07/07/2019 Negative.   H/o abnormal pap smear:  remote hx   reports that she has never smoked. She has never used smokeless tobacco. She reports current alcohol use. She reports that she does not use drugs.  Past Medical History:  Diagnosis Date   AC (acromioclavicular) joint bone spurs    Allergy    Anemia    Anxiety    Degenerative arthritis    Depression    GERD (gastroesophageal reflux disease)    Goiter    multi nodular    History of anemia    History of PCOS    Hyperlipidemia    Hypertension    Nevus of left eye    Nuclear cataract 06/13/2011   PCOS (polycystic ovarian syndrome)    Status post intraocular lens implant 06/13/2011    Past Surgical History:  Procedure Laterality Date   24 HOUR [place] STUDY  04/16/2019   Procedure: 24 HOUR [place] STUDY;  Surgeon: Mauri Pole, MD;  Location: [facility];  Service: Endoscopy;;    BUNIONECTOMY Bilateral 2006   Feet   CATARACT EXTRACTION Left    COLONOSCOPY     ESOPHAGEAL MANOMETRY N/A 04/16/2019   Procedure: ESOPHAGEAL MANOMETRY (EM) with 24 hour PH Impedence study;  Surgeon: Mauri Pole, MD;  Location: [facility];  Service: Endoscopy;  Laterality: N/A;   HERNIA REPAIR Right 1959   [place] IMPEDANCE STUDY N/A 04/16/2019   Procedure: [place] IMPEDANCE STUDY;  Surgeon: Mauri Pole, MD;  Location: [facility];  Service: Endoscopy;  Laterality: N/A;   TOE SURGERY Left 2006   Bone Spur   TONSILLECTOMY AND ADENOIDECTOMY  1959   TUBAL LIGATION  1995    Current Outpatient Medications  Medication Sig Dispense Refill   amLODipine (NORVASC) 2.5 MG tablet Take  1 tablet  Daily  for BP 90 tablet 3   aspirin EC 81 MG tablet Take 81 mg by mouth daily.     B Complex Vitamins (VITAMIN B COMPLEX PO) Take by mouth.     Cetirizine HCl (ZYRTEC ALLERGY PO) Take by mouth as needed.      Cholecalciferol (VITAMIN D PO) Take 10,000 Units by mouth daily.      COD LIVER OIL PO Take by mouth daily.     Coenzyme Q10 100 MG  TABS Take by mouth daily.     diphenhydrAMINE (BENADRYL) 25 MG tablet Take 25 mg by mouth as needed.      estradiol (ESTRACE) 0.1 MG/GM vaginal cream 1 gram pv twice weekly 42.5 g 1   Estradiol 10 MCG TABS vaginal tablet PLACE 1 INSERT VAGINALLY  TWICE WEEKLY 24 tablet 1   fexofenadine (ALLEGRA) 180 MG tablet Take 180 mg by mouth daily. Alternating with Zyrtec     MAGNESIUM PO Take 800 Units by mouth daily.      MELATONIN PO Take by mouth as needed. Reported on 08/13/2015     Multiple Vitamins-Minerals (HAIR/SKIN/NAILS/BIOTIN) TABS Take 3 tablets by mouth daily.     Omega-3 Fatty Acids (FISH OIL PO) Take 1,200 mg by mouth.      Probiotic Product (PROBIOTIC DAILY PO) Take by mouth daily.     rosuvastatin (CRESTOR) 40 MG tablet TAKE 1 TABLET BY MOUTH  DAILY FOR CHOLESTEROL (Patient taking differently: 1/2 tablet Mon,  Wed, Fri) 90 tablet 3   spironolactone  (ALDACTONE) 100 MG tablet TAKE 2 TABLETS BY MOUTH  DAILY 180 tablet 1   valACYclovir (VALTREX) 1000 MG tablet Take 1 tablet (1,000 mg total) by mouth daily. 90 tablet 1   zinc gluconate 50 MG tablet Take 50 mg by mouth daily.     rizatriptan (MAXALT) 10 MG tablet Take 1 tablet (10 mg total) by mouth once as needed for migraine. May repeat in 2 hours if needed 30 tablet 2   No current facility-administered medications for this visit.    Family History  Problem Relation Age of Onset   Thyroid disease Mother        hyperthyroidism   Hypertension Mother    Hyperlipidemia Mother    Heart disease Mother 6   Atrial fibrillation Mother    Dementia Mother    Congestive Heart Failure Mother    Heart disease Father    Lung cancer Father    Liver cancer Father    Cancer Brother 19       sarcoma   Hyperlipidemia Brother    Heart disease Brother 66   Stroke Paternal Grandmother    Emphysema Maternal Grandfather    Thyroid disease Daughter    Other Daughter        gluten sensetivity   Polycystic ovary syndrome Daughter    Colon cancer Neg Hx    Esophageal cancer Neg Hx    Rectal cancer Neg Hx    Stomach cancer Neg Hx     Review of Systems  All other systems reviewed and are negative.  Exam:   BP 126/82 (BP Location: Right Arm, Patient Position: Sitting, Cuff Size: Small)   Pulse 72   Ht '5\' 6"'$  (1.676 m)   Wt 144 lb 3.2 oz (65.4 kg)   LMP 12/05/2010   BMI 23.27 kg/m   Height: '5\' 6"'$  (167.6 cm)  General appearance: alert, cooperative and appears stated age Breasts: normal appearance, no masses or tenderness Abdomen: soft, non-tender; bowel sounds normal; no masses,  no organomegaly Lymph nodes: Cervical, supraclavicular, and axillary nodes normal.  No abnormal inguinal nodes palpated Neurologic: Grossly normal  Pelvic: External genitalia:  no lesions              Urethra:  normal appearing urethra with no masses, tenderness or lesions              Bartholins and Skenes: normal  Vagina: normal appearing vagina with atrophic changes and no discharge, no lesions              Cervix: no lesions              Pap taken: No. Bimanual Exam:  Uterus:  normal size, contour, position, consistency, mobility, non-tender              Adnexa: normal adnexa and no mass, fullness, tenderness               Rectovaginal: Confirms               Anus:  normal sphincter tone, no lesions  Chaperone, Octaviano Batty, CMA, was present for exam.  Assessment/Plan: 1. GYN exam for high-risk Medicare patient - pap neg 2021 - MMG 09/2020 - colonoscopy 2016 - BMD scheduled for 02/2021 - vaccines reviewed - lab work done with Dr. Idell Pickles office  2. Postmenopausal - does not need estradiol tablets at this time - estradiol (ESTRACE) 0.1 MG/GM vaginal cream; 1 gram pv twice weekly  Dispense: 42.5 g; Refill: 1  3. History of PCOS  4. History of female hirsutism - does not need spironolactone rx  5. Recurrent vaginitis - Cervicovaginal ancillary only( [place])  6. Oral herpes - valACYclovir (VALTREX) 1000 MG tablet; Take 1 tabs with symptom onset and repeat in 12 hours.  Dispense: 90 tablet; Refill: 1

## 2020-10-18 LAB — CERVICOVAGINAL ANCILLARY ONLY
Bacterial Vaginitis (gardnerella): NEGATIVE
Candida Glabrata: NEGATIVE
Candida Vaginitis: NEGATIVE
Comment: NEGATIVE
Comment: NEGATIVE
Comment: NEGATIVE

## 2020-11-25 ENCOUNTER — Other Ambulatory Visit: Payer: Self-pay | Admitting: Obstetrics & Gynecology

## 2020-11-26 NOTE — Telephone Encounter (Signed)
LMOVM for pt to call regarding refill 

## 2021-01-10 ENCOUNTER — Other Ambulatory Visit (HOSPITAL_BASED_OUTPATIENT_CLINIC_OR_DEPARTMENT_OTHER): Payer: Self-pay | Admitting: Obstetrics & Gynecology

## 2021-01-10 DIAGNOSIS — B002 Herpesviral gingivostomatitis and pharyngotonsillitis: Secondary | ICD-10-CM

## 2021-02-01 ENCOUNTER — Other Ambulatory Visit: Payer: Self-pay | Admitting: Internal Medicine

## 2021-02-01 ENCOUNTER — Encounter: Payer: Self-pay | Admitting: Internal Medicine

## 2021-02-01 MED ORDER — AZITHROMYCIN 250 MG PO TABS: ORAL_TABLET | ORAL | 1 refills | Status: DC

## 2021-02-01 MED ORDER — PSEUDOEPHEDRINE HCL ER 120 MG PO TB12: ORAL_TABLET | ORAL | 3 refills | Status: DC

## 2021-02-01 MED ORDER — CLARITHROMYCIN 500 MG PO TABS: ORAL_TABLET | ORAL | 1 refills | Status: DC

## 2021-02-01 MED ORDER — DEXAMETHASONE 4 MG PO TABS: ORAL_TABLET | ORAL | 0 refills | Status: DC

## 2021-02-11 LAB — HM DIABETES EYE EXAM

## 2021-02-17 ENCOUNTER — Other Ambulatory Visit: Payer: Self-pay

## 2021-02-17 ENCOUNTER — Ambulatory Visit
Admission: RE | Admit: 2021-02-17 | Discharge: 2021-02-17 | Disposition: A | Discharge status: Home / Self Care | Payer: Medicare Other | Source: Ambulatory Visit | Attending: Obstetrics & Gynecology | Admitting: Obstetrics & Gynecology

## 2021-02-17 DIAGNOSIS — E2839 Other primary ovarian failure: Secondary | ICD-10-CM

## 2021-03-10 NOTE — Progress Notes (Signed)
COMPLETE PHSYICAL  Assessment and Plan:  Encounter for general adult medical examination with abnormal findings Yearly Declines COVID vaccine- states can not trust due to agenda of people  Essential hypertension - continue medications: Norvasc 2.5mg  and spironolactone 100mg  DASH diet, exercise and monitor at home. Call if greater than 130/80.  -     CBC with Diff -     COMPLETE METABOLIC PANEL WITH GFR -     TSH  Migraine with aura and without status migrainosus, not intractable Avoid triggers, monitor Continue Rizatriptan as needed  Mixed hyperlipidemia Continue medications: Rosuvastatin 40mg  1/2 tab M-W-F Discussed dietary and exercise modifications Low fat diet -     Lipid Profile - CMP - CBC  Postmenopausal atrophic vaginitis Monitor- following with Dr. Sabra Heck  Vitamin D deficiency Continue supplementation to maintain goal of 70-100 Taking Vitamin D 10,000 IU daily -     Vitamin D (25 hydroxy)  History of cold sores Monitor  Thyroid nodule No further monitoring at this time TSH  Screening for ischemic heart disease EKG  Screening for hematuria or proteinuria Routine UA with reflex microscopic Microalbumin/creatinine urine ratio  Abnormal glucose Continue diet and exercise A1c  BMI 23.0-23.9 Discussed dietary and exercise modifications  Medication management -     Magnesium  Edema of lower extremities Encouraged to wear compressions socks during the day HCTZ 12.5 mg 1 tab po qd Limit salt, push fluids  Post nasal drip Continue Mucinex, allergy pill and Neti pot   Discussed med's effects and SE's. Screening labs and tests as requested with regular follow-up as recommended.  Further disposition pending results if labs check today. Discussed med's effects and SE's.   Over 30 minutes of face to face interview, exam, counseling, chart review, and critical decision making was performed.   Future Appointments  Date Time Provider [facility]   09/20/2021  9:30 AM Magda Bernheim, NP [facility] None  10/24/2021  1:45 PM Megan Salon, MD [facility]  03/14/2022  2:00 PM Magda Bernheim, NP [facility] None      HPI  71 y.o. Christine [name]  presents for complete physical.  She had increased intraocular pressure after last use of Dexamethasone and is following with Dr. Eliseo Squires with Dr. Payton Emerald office.  She is having swelling in her lower legs which has been present for several months. She does not sit for extended periods.  She does walk her german shepherd daily. She does wear compression socks. The edema is better in the morning and gets worse throughout the day.  Her hair is falling out more started when she was on dexamethasone and Biaxin and has persisted.   Complaining of persistent post nasal drip which is effecting voice. Denies cough, congestion, fever, chills, headaches, nausea and vomiting.  BMI is Body mass index is 23.18 kg/m., she is working on diet and exercise. Wt Readings from Last 3 Encounters:  03/14/21 143 lb 9.6 oz (65.1 kg)  10/15/20 144 lb 3.2 oz (65.4 kg)  09/20/20 145 lb 6.4 oz (66 kg)    She has been following with Dr. Silverio Decamp for dysphagia/glubulus sensation.  03/2019 EGD: Normal esophaus, TTS dilator 19mm, esophageal biopsies negative for EOE and gastric biopsies negative for H.pylori Normal manometry and Ph study  She continues to have some mild dysphagia, but improved, with very specific foods, not happening as often.   Her blood pressure has been controlled at home, today their BP is BP: 124/68.   BP Readings from Last 3 Encounters:  03/14/21 124/68  10/15/20 126/82  09/20/20 124/Christine Christine [name]   She does workout. She denies chest pain, shortness of breath, dizziness.   She is on cholesterol medication 40mg  1/2 a pill 3 x a week and denies myalgias- had grade 2 muscle tear with higher dose. Her cholesterol is not at goal. The cholesterol last visit was:  Lab Results  Component Value Date   CHOL 241 (H) 09/21/2020    HDL 58 09/21/2020   LDLCALC 153 (H) 09/21/2020   TRIG 166 (H) 09/21/2020   CHOLHDL 4.2 09/21/2020    Last A1C in the office was:  Lab Results  Component Value Date   HGBA1C 5.6 09/21/2020   Patient is on Vitamin D supplement.   Lab Results  Component Value Date   VD25OH 62 09/21/2020        Current Medications:   Current Outpatient Medications (Endocrine & Metabolic):    dexamethasone (DECADRON) 4 MG tablet, Take 1 tab 3 x day - 3 days, then 2 x day - 3 days, then 1 tab daily  Current Outpatient Medications (Cardiovascular):    amLODipine (NORVASC) 2.5 MG tablet, Take  1 tablet  Daily  for BP   rosuvastatin (CRESTOR) 40 MG tablet, TAKE 1 TABLET BY MOUTH  DAILY FOR CHOLESTEROL (Patient taking differently: 1/2 tablet Mon,  Wed, Fri)   spironolactone (ALDACTONE) 100 MG tablet, TAKE 2 TABLETS BY MOUTH  DAILY  Current Outpatient Medications (Respiratory):    Cetirizine HCl (ZYRTEC ALLERGY PO), Take by mouth as needed.    diphenhydrAMINE (BENADRYL) 25 MG tablet, Take 25 mg by mouth as needed.    fexofenadine (ALLEGRA) 180 MG tablet, Take 180 mg by mouth daily. Alternating with Zyrtec   pseudoephedrine (SUDAFED) 120 MG 12 hr tablet, Take  1 tablet  2 x /day (every 12 hours)  for Head and Chest Congestion  Current Outpatient Medications (Analgesics):    aspirin EC 81 MG tablet, Take 81 mg by mouth daily.   rizatriptan (MAXALT) 10 MG tablet, Take 1 tablet (10 mg total) by mouth once as needed for migraine. May repeat in 2 hours if needed   Current Outpatient Medications (Other):    clarithromycin (BIAXIN) 500 MG tablet, Take 1 tablet 2 x /day with Food for Infection   B Complex Vitamins (VITAMIN B COMPLEX PO), Take by mouth.   Cholecalciferol (VITAMIN D PO), Take 10,000 Units by mouth daily.    COD LIVER OIL PO, Take by mouth daily.   Coenzyme Q10 100 MG TABS, Take by mouth daily.   estradiol (ESTRACE) 0.1 MG/GM vaginal cream, 1 gram pv twice weekly   Estradiol 10 MCG TABS  vaginal tablet, PLACE 1 INSERT VAGINALLY  TWICE WEEKLY   MAGNESIUM PO, Take 800 Units by mouth daily.    MELATONIN PO, Take by mouth as needed. Reported on 08/13/2015   Multiple Vitamins-Minerals (HAIR/SKIN/NAILS/BIOTIN) TABS, Take 3 tablets by mouth daily.   Omega-3 Fatty Acids (FISH OIL PO), Take 1,200 mg by mouth.    Probiotic Product (PROBIOTIC DAILY PO), Take by mouth daily.   valACYclovir (VALTREX) 1000 MG tablet, TAKE 1 TABS WITH SYMPTOM ONSET AND REPEAT IN 12 HOURS.   zinc gluconate 50 MG tablet, Take 50 mg by mouth daily.  Health Maintenance:   Immunization History  Administered Date(s) Administered   Influenza Split 12/23/2013, 12/24/2014   Influenza Whole 12/16/2012   Influenza, High Dose Seasonal PF 12/15/2016, 11/18/2017, 12/24/2018   Influenza,inj,quad, With Preservative 12/24/2015   Influenza-Unspecified 12/20/2016   Pneumococcal Conjugate-13  12/15/2016   Pneumococcal Polysaccharide-23 08/24/2004   Tdap 11/28/2010   Zoster Recombinat (Shingrix) 12/27/2016, 04/03/2017   Zoster, Live 01/19/2012   Health Maintenance  Topic Date Due   Pneumonia Vaccine 67+ Years old (3 - PPSV23 if available, else PCV20) 12/15/2017   INFLUENZA VACCINE  10/04/2020   TETANUS/TDAP  11/27/2020   Hepatitis C Screening  09/20/2021 (Originally 12/04/1968)   MAMMOGRAM  09/09/2022   COLONOSCOPY (Pts 45-51yrs Insurance coverage will need to be confirmed)  02/04/2025   DEXA SCAN  Completed   Zoster Vaccines- Shingrix  Completed   HPV VACCINES  Aged Out   COVID-19 Vaccine  Discontinued    Tetanus:  2012 Pneumovax: 2006 Prevnar 13: 2018 Flu vaccine: 10/04/20 Zostavax: 2013 Shingrix: 2018 Hepatits A and B  PAP 02/2018 Dr. Sabra Heck, Due MGM: 09/08/20 DEXA: 02/17/21- normal Colonoscopy: 2016 was seeing Dr. Peggye Ley, due 2026 Stress test 2006 US transvaginal 2014 US thyroid 2019  IMPRESSION: 1. Similar findings of multinodular goiter. No definitive worrisome new or enlarging thyroid nodules. 2.  Previously identified thyroid nodules appear similar to the 07/2012 examination. Stability for 5 years is indicative of benign etiology and as such, percutaneous sampling and/or continued dedicated follow-up of any of the nodules is not recommended.   Opthalmology:  Dr. Harle Stanford, and Dr. Annamaria Boots  Patient Care Team: Unk Pinto, MD as PCP - General (Internal Medicine) Megan Salon, MD as Consulting Physician (Gynecology) Carlena Bjornstad, MD as Consulting Physician (Cardiology) Inda Castle, MD (Inactive) as Consulting Physician (Gastroenterology) Monna Fam, MD as Consulting Physician (Ophthalmology) Gerarda Fraction, MD as Referring Physician (Ophthalmology) Glenna Fellows, MD as Attending Physician (Neurosurgery) Haverstock, Jennefer Bravo, MD as Referring Physician (Dermatology) Marybelle Killings, MD as Consulting Physician (Orthopedic Surgery)  Allergies:  Allergies  Allergen Reactions   Bactrim [Sulfamethoxazole-Trimethoprim] Itching   Lipitor [Atorvastatin] Other (See Comments)    headache   Monistat [Miconazole] Itching    burning    Medical History:  Past Medical History:  Diagnosis Date   AC (acromioclavicular) joint bone spurs    Allergy    Anemia    Anxiety    Degenerative arthritis    Depression    GERD (gastroesophageal reflux disease)    Goiter    multi nodular    History of anemia    History of PCOS    Hyperlipidemia    Hypertension    Nevus of left eye    Nuclear cataract 06/13/2011   PCOS (polycystic ovarian syndrome)    Status post intraocular lens implant 06/13/2011   Allergies Allergies  Allergen Reactions   Bactrim [Sulfamethoxazole-Trimethoprim] Itching   Lipitor [Atorvastatin] Other (See Comments)    headache   Monistat [Miconazole] Itching    burning    SURGICAL HISTORY She  has a past surgical history that includes Tubal ligation (1995); Tonsillectomy and adenoidectomy (1959); Bunionectomy (Bilateral, 2006); Toe Surgery (Left,  2006); Hernia repair (Right, 1959); Cataract extraction (Left); Colonoscopy; Esophageal manometry (N/A, 04/16/2019); PH impedance study (N/A, 04/16/2019); and 24 hour ph study (04/16/2019). FAMILY HISTORY Her family history includes Atrial fibrillation in her mother; Cancer (age of onset: 67) in her brother; Congestive Heart Failure in her mother; Dementia in her mother; Emphysema in her maternal grandfather; Heart disease in her father; Heart disease (age of onset: 71) in her brother; Heart disease (age of onset: 14) in her mother; Hyperlipidemia in her brother and mother; Hypertension in her mother; Liver cancer in her father; Lung cancer in her father; Other in her  daughter; Polycystic ovary syndrome in her daughter; Stroke in her paternal grandmother; Thyroid disease in her daughter and mother. SOCIAL HISTORY She  reports that she has never smoked. She has never used smokeless tobacco. She reports current alcohol use. She reports that she does not use drugs.  Review of Systems: Review of Systems  Constitutional:  Negative for chills, diaphoresis, fever, malaise/fatigue and weight loss.  HENT:  Negative for congestion, ear pain and sore throat.        Post nasal drip  Eyes: Negative.  Negative for blurred vision, double vision and photophobia.  Respiratory:  Negative for cough, shortness of breath and wheezing.   Cardiovascular:  Positive for leg swelling. Negative for chest pain and palpitations.  Gastrointestinal:  Negative for abdominal pain, blood in stool, constipation, diarrhea, heartburn, melena, nausea and vomiting.  Genitourinary: Negative.   Musculoskeletal:  Negative for back pain and joint pain.  Skin: Negative.  Negative for rash.  Neurological:  Negative for dizziness, sensory change, loss of consciousness and headaches.  Psychiatric/Behavioral:  Negative for depression. The patient is not nervous/anxious and does not have insomnia.    Physical Exam: Estimated body mass index is  23.18 kg/m as calculated from the following:   Height as of this encounter: 5\' 6"  (1.676 m).   Weight as of this encounter: 143 lb 9.6 oz (65.1 kg). BP 124/68    Pulse 86    Temp (!) 97.5 F (36.4 C)    Ht 5\' 6"  (1.676 m)    Wt 143 lb 9.6 oz (65.1 kg)    LMP 12/05/2010    SpO2 97%    BMI 23.18 kg/m     Wt Readings from Last 3 Encounters:  03/14/21 143 lb 9.6 oz (65.1 kg)  10/15/20 144 lb 3.2 oz (65.4 kg)  09/20/20 145 lb 6.4 oz (66 kg)   General Appearance: Well norrished well developed, in no apparent distress.  Eyes: PERRLA, EOMs, conjunctiva no swelling or erythema ENT/Mouth: Ear canals normal without obstruction, swelling, erythema, or discharge.  TMs normal bilaterally with no erythema, bulging, retraction, or loss of landmark.  Oropharynx moist and clear with no exudate, erythema, or swelling.   Neck: Supple, thyroid normal. No bruits.  No cervical adenopathy Respiratory: Respiratory effort normal, Breath sounds clear A&P without wheeze, rhonchi, rales.   Cardio: RRR without murmurs, rubs or gallops. Brisk peripheral pulses, 1+ nonpitting edema of lower extremities bilaterally.  Chest: symmetric, with normal excursions Breasts: defer Abdomen: Soft, nontender, no guarding, no rebound, hernias, masses, or organomegaly.  Lymphatics: Non tender without lymphadenopathy.  Genitourinary: Deferred to Gyn for yearly checkups Musculoskeletal: Full ROM all peripheral extremities,5/5 strength, and normal gait.  Skin: Warm, dry without rashes, lesions, ecchymosis. Lipoma right thigh.  Neuro: Awake and oriented X 3, Cranial nerves intact, reflexes equal bilaterally. Normal muscle tone, no cerebellar symptoms. Sensation intact.  Psych:  normal affect, Insight and Judgment appropriate.  EKG: NSR, no ST changes   [facility]  03/14/2021

## 2021-03-14 ENCOUNTER — Ambulatory Visit (INDEPENDENT_AMBULATORY_CARE_PROVIDER_SITE_OTHER): Payer: Medicare Other | Admitting: Nurse Practitioner

## 2021-03-14 ENCOUNTER — Encounter: Payer: Self-pay | Admitting: Nurse Practitioner

## 2021-03-14 ENCOUNTER — Other Ambulatory Visit: Payer: Self-pay

## 2021-03-14 VITALS — BP 124/68 | HR 86 | Temp 97.5°F | Ht 66.0 in | Wt 143.6 lb

## 2021-03-14 DIAGNOSIS — Z0001 Encounter for general adult medical examination with abnormal findings: Secondary | ICD-10-CM

## 2021-03-14 DIAGNOSIS — R600 Localized edema: Secondary | ICD-10-CM

## 2021-03-14 DIAGNOSIS — Z0000 Encounter for general adult medical examination without abnormal findings: Secondary | ICD-10-CM

## 2021-03-14 DIAGNOSIS — Z136 Encounter for screening for cardiovascular disorders: Secondary | ICD-10-CM | POA: Diagnosis not present

## 2021-03-14 DIAGNOSIS — Z8619 Personal history of other infectious and parasitic diseases: Secondary | ICD-10-CM

## 2021-03-14 DIAGNOSIS — G43109 Migraine with aura, not intractable, without status migrainosus: Secondary | ICD-10-CM

## 2021-03-14 DIAGNOSIS — Z1389 Encounter for screening for other disorder: Secondary | ICD-10-CM

## 2021-03-14 DIAGNOSIS — Z6822 Body mass index (BMI) 22.0-22.9, adult: Secondary | ICD-10-CM

## 2021-03-14 DIAGNOSIS — R7309 Other abnormal glucose: Secondary | ICD-10-CM

## 2021-03-14 DIAGNOSIS — I10 Essential (primary) hypertension: Secondary | ICD-10-CM | POA: Diagnosis not present

## 2021-03-14 DIAGNOSIS — E041 Nontoxic single thyroid nodule: Secondary | ICD-10-CM

## 2021-03-14 DIAGNOSIS — E559 Vitamin D deficiency, unspecified: Secondary | ICD-10-CM

## 2021-03-14 DIAGNOSIS — N952 Postmenopausal atrophic vaginitis: Secondary | ICD-10-CM

## 2021-03-14 DIAGNOSIS — R0982 Postnasal drip: Secondary | ICD-10-CM

## 2021-03-14 DIAGNOSIS — Z79899 Other long term (current) drug therapy: Secondary | ICD-10-CM

## 2021-03-14 DIAGNOSIS — E782 Mixed hyperlipidemia: Secondary | ICD-10-CM

## 2021-03-14 MED ORDER — RIZATRIPTAN BENZOATE 10 MG PO TABS: 10.0000 mg | ORAL_TABLET | Freq: Once | ORAL | 2 refills | Status: AC | PRN

## 2021-03-14 MED ORDER — HYDROCHLOROTHIAZIDE 12.5 MG PO TABS: 25.0000 mg | ORAL_TABLET | Freq: Every day | ORAL | 3 refills | Status: DC

## 2021-03-14 NOTE — Patient Instructions (Signed)
Edema °Edema is an abnormal buildup of fluids in the body tissues and under the skin. Swelling of the legs, feet, and ankles is a common symptom that becomes more likely as you get older. Swelling is also common in looser tissues, like around the eyes. When the affected area is squeezed, the fluid may move out of that spot and leave a dent for a few moments. This dent is called pitting edema. °There are many possible causes of edema. Eating too much salt (sodium) and being on your feet or sitting for a long time can cause edema in your legs, feet, and ankles. Hot weather may make edema worse. Common causes of edema include: °Heart failure. °Liver or kidney disease. °Weak leg blood vessels. °Cancer. °An injury. °Pregnancy. °Medicines. °Being obese. °Low protein levels in the blood. °Edema is usually painless. Your skin may look swollen or shiny. °Follow these instructions at home: °Keep the affected body part raised (elevated) above the level of your heart when you are sitting or lying down. °Do not sit still or stand for long periods of time. °Do not wear tight clothing. Do not wear garters on your upper legs. °Exercise your legs to get your circulation going. This helps to move the fluid back into your blood vessels, and it may help the swelling go down. °Wear elastic bandages or support stockings to reduce swelling as told by your health care provider. °Eat a low-salt (low-sodium) diet to reduce fluid as told by your health care provider. °Depending on the cause of your swelling, you may need to limit how much fluid you drink (fluid restriction). °Take over-the-counter and prescription medicines only as told by your health care provider. °Contact a health care provider if: °Your edema does not get better with treatment. °You have heart, liver, or kidney disease and have symptoms of edema. °You have sudden and unexplained weight gain. °Get help right away if: °You develop shortness of breath or chest pain. °You  cannot breathe when you lie down. °You develop pain, redness, or warmth in the swollen areas. °You have heart, liver, or kidney disease and suddenly get edema. °You have a fever and your symptoms suddenly get worse. °Summary °Edema is an abnormal buildup of fluids in the body tissues and under the skin. °Eating too much salt (sodium) and being on your feet or sitting for a long time can cause edema in your legs, feet, and ankles. °Keep the affected body part raised (elevated) above the level of your heart when you are sitting or lying down. °This information is not intended to replace advice given to you by your health care provider. Make sure you discuss any questions you have with your health care provider. °Document Revised: 07/22/2020 Document Reviewed: 12/16/2019 °Elsevier Patient Education © 2022 [employer] ° °

## 2021-03-15 LAB — CBC WITH DIFFERENTIAL/PLATELET
Absolute Monocytes: 511 cells/uL (ref 200–950)
Basophils Absolute: 83 cells/uL (ref 0–200)
Basophils Relative: 1.2 %
Eosinophils Absolute: 41 cells/uL (ref 15–500)
Eosinophils Relative: 0.6 %
HCT: 41.8 % (ref 35.0–45.0)
Hemoglobin: 13.7 g/dL (ref 11.7–15.5)
Lymphs Abs: 1559 cells/uL (ref 850–3900)
MCH: 30 pg (ref 27.0–33.0)
MCHC: 32.8 g/dL (ref 32.0–36.0)
MCV: 91.5 fL (ref 80.0–100.0)
MPV: 9.6 fL (ref 7.5–12.5)
Monocytes Relative: 7.4 %
Neutro Abs: 4706 cells/uL (ref 1500–7800)
Neutrophils Relative %: 68.2 %
Platelets: 387 10*3/uL (ref 140–400)
RBC: 4.57 10*6/uL (ref 3.80–5.10)
RDW: 13.4 % (ref 11.0–15.0)
Total Lymphocyte: 22.6 %
WBC: 6.9 10*3/uL (ref 3.8–10.8)

## 2021-03-15 LAB — URINALYSIS, ROUTINE W REFLEX MICROSCOPIC
Bilirubin Urine: NEGATIVE
Glucose, UA: NEGATIVE
Hgb urine dipstick: NEGATIVE
Ketones, ur: NEGATIVE
Leukocytes,Ua: NEGATIVE
Nitrite: NEGATIVE
Protein, ur: NEGATIVE
Specific Gravity, Urine: 1.009 (ref 1.001–1.035)
pH: 5.5 (ref 5.0–8.0)

## 2021-03-15 LAB — COMPLETE METABOLIC PANEL WITH GFR
AG Ratio: 2.9 (calc) — ABNORMAL HIGH (ref 1.0–2.5)
ALT: 20 U/L (ref 6–29)
AST: 18 U/L (ref 10–35)
Albumin: 4.9 g/dL (ref 3.6–5.1)
Alkaline phosphatase (APISO): 115 U/L (ref 37–153)
BUN: 14 mg/dL (ref 7–25)
CO2: 26 mmol/L (ref 20–32)
Calcium: 10.2 mg/dL (ref 8.6–10.4)
Chloride: 100 mmol/L (ref 98–110)
Creat: 0.78 mg/dL (ref 0.50–1.05)
Globulin: 1.7 g/dL (calc) — ABNORMAL LOW (ref 1.9–3.7)
Glucose, Bld: 89 mg/dL (ref 65–99)
Potassium: 5 mmol/L (ref 3.5–5.3)
Sodium: 140 mmol/L (ref 135–146)
Total Bilirubin: 0.4 mg/dL (ref 0.2–1.2)
Total Protein: 6.6 g/dL (ref 6.1–8.1)
eGFR: 82 mL/min/{1.73_m2} (ref >=60)

## 2021-03-15 LAB — LIPID PANEL
Cholesterol: 226 mg/dL — ABNORMAL HIGH (ref <200)
HDL: 55 mg/dL (ref >=50)
LDL Cholesterol (Calc): 131 mg/dL (calc) — ABNORMAL HIGH
Non-HDL Cholesterol (Calc): 171 mg/dL (calc) — ABNORMAL HIGH (ref <130)
Total CHOL/HDL Ratio: 4.1 (calc) (ref <5.0)
Triglycerides: 247 mg/dL — ABNORMAL HIGH (ref <150)

## 2021-03-15 LAB — HEMOGLOBIN A1C
Hgb A1c MFr Bld: 5.9 % of total Hgb — ABNORMAL HIGH (ref <5.7)
Mean Plasma Glucose: 123 mg/dL
eAG (mmol/L): 6.8 mmol/L

## 2021-03-15 LAB — MAGNESIUM: Magnesium: 2.1 mg/dL (ref 1.5–2.5)

## 2021-03-15 LAB — MICROALBUMIN / CREATININE URINE RATIO
Creatinine, Urine: 25 mg/dL (ref 20–275)
Microalb, Ur: <0.2 mg/dL

## 2021-03-15 LAB — VITAMIN D 25 HYDROXY (VIT D DEFICIENCY, FRACTURES): Vit D, 25-Hydroxy: 66 ng/mL (ref 30–100)

## 2021-03-15 LAB — TSH: TSH: 1.89 mIU/L (ref 0.40–4.50)

## 2021-03-30 ENCOUNTER — Encounter: Payer: Self-pay | Admitting: Internal Medicine

## 2021-04-28 ENCOUNTER — Other Ambulatory Visit: Payer: Self-pay

## 2021-04-28 ENCOUNTER — Ambulatory Visit: Payer: Medicare Other | Admitting: Nurse Practitioner

## 2021-04-28 ENCOUNTER — Encounter: Payer: Self-pay | Admitting: Nurse Practitioner

## 2021-04-28 VITALS — HR 106 | Temp 100.6°F

## 2021-04-28 DIAGNOSIS — R6889 Other general symptoms and signs: Secondary | ICD-10-CM | POA: Diagnosis not present

## 2021-04-28 DIAGNOSIS — U071 COVID-19: Secondary | ICD-10-CM

## 2021-04-28 DIAGNOSIS — Z1152 Encounter for screening for COVID-19: Secondary | ICD-10-CM

## 2021-04-28 LAB — POC COVID19 BINAXNOW: SARS Coronavirus 2 Ag: POSITIVE — AB

## 2021-04-28 LAB — POCT INFLUENZA A/B
Influenza A, POC: NEGATIVE
Influenza B, POC: NEGATIVE

## 2021-04-28 MED ORDER — AZITHROMYCIN 250 MG PO TABS: ORAL_TABLET | ORAL | 1 refills | Status: DC

## 2021-04-28 MED ORDER — MOLNUPIRAVIR EUA 200MG CAPSULE: 4.0000 | ORAL_CAPSULE | Freq: Two times a day (BID) | ORAL | 0 refills | Status: AC

## 2021-04-28 MED ORDER — PROMETHAZINE-DM 6.25-15 MG/5ML PO SYRP: 5.0000 mL | ORAL_SOLUTION | Freq: Four times a day (QID) | ORAL | 1 refills | Status: DC | PRN

## 2021-04-28 NOTE — Progress Notes (Signed)
THIS ENCOUNTER IS A VIRTUAL VISIT DUE TO COVID-19 - PATIENT WAS NOT SEEN IN THE OFFICE.  PATIENT HAS CONSENTED TO VIRTUAL VISIT / TELEMEDICINE VISIT   Virtual Visit via telephone Note  I connected with  Christine [name] on 04/28/2021 by telephone.  I verified that I am speaking with the correct person using two identifiers.    I discussed the limitations of evaluation and management by telemedicine and the availability of in person appointments. The patient expressed understanding and agreed to proceed.  History of Present Illness:  Pulse (!) 106    Temp (!) 100.6 F (38.1 C)    LMP 12/05/2010    SpO2 96%  71 y.o. patient contacted office reporting URI sx . she tested positive by test in parking lot at office today . OV was conducted by telephone to minimize exposure. This patient was not vaccinated for covid 19.  Sx began 3 days ago with sinus congestion of green drainage, productive cough of green mucus.  Having body aches, headaches, fatigued.  Has been running fever since 2 days ago - highest fever has been 102.7 Denies nausea, vomiting, diarrhea, sore throat.  Treatments tried so far: Advil alternating with Tylenol  Exposures: unknown   Medications   Current Outpatient Medications (Cardiovascular):    amLODipine (NORVASC) 2.5 MG tablet, Take  1 tablet  Daily  for BP   rosuvastatin (CRESTOR) 40 MG tablet, TAKE 1 TABLET BY MOUTH  DAILY FOR CHOLESTEROL (Patient taking differently: 1/2 tablet Mon,  Wed, Fri)   spironolactone (ALDACTONE) 100 MG tablet, TAKE 2 TABLETS BY MOUTH  DAILY   hydrochlorothiazide (HYDRODIURIL) 12.5 MG tablet, Take 2 tablets (25 mg total) by mouth daily. (Patient not taking: Reported on 04/28/2021)  Current Outpatient Medications (Respiratory):    diphenhydrAMINE (BENADRYL) 25 MG tablet, Take 25 mg by mouth as needed.    fexofenadine (ALLEGRA) 180 MG tablet, Take 180 mg by mouth daily. Alternating with Zyrtec   Cetirizine HCl (ZYRTEC ALLERGY PO), Take by  mouth as needed.  (Patient not taking: Reported on 04/28/2021)   pseudoephedrine (SUDAFED) 120 MG 12 hr tablet, Take  1 tablet  2 x /day (every 12 hours)  for Head and Chest Congestion (Patient not taking: Reported on 04/28/2021)  Current Outpatient Medications (Analgesics):    aspirin EC 81 MG tablet, Take 81 mg by mouth daily.   rizatriptan (MAXALT) 10 MG tablet, Take 1 tablet (10 mg total) by mouth once as needed for migraine. May repeat in 2 hours if needed   Current Outpatient Medications (Other):    B Complex Vitamins (VITAMIN B COMPLEX PO), Take by mouth.   Cholecalciferol (VITAMIN D PO), Take 10,000 Units by mouth daily.    COD LIVER OIL PO, Take by mouth daily.   Coenzyme Q10 100 MG TABS, Take by mouth daily.   estradiol (ESTRACE) 0.1 MG/GM vaginal cream, 1 gram pv twice weekly   Estradiol 10 MCG TABS vaginal tablet, PLACE 1 INSERT VAGINALLY  TWICE WEEKLY   MAGNESIUM PO, Take 800 Units by mouth daily.    MELATONIN PO, Take by mouth as needed. Reported on 08/13/2015   Multiple Vitamins-Minerals (HAIR/SKIN/NAILS/BIOTIN) TABS, Take 3 tablets by mouth daily.   Omega-3 Fatty Acids (FISH OIL PO), Take 1,200 mg by mouth.    Probiotic Product (PROBIOTIC DAILY PO), Take by mouth daily.   valACYclovir (VALTREX) 1000 MG tablet, TAKE 1 TABS WITH SYMPTOM ONSET AND REPEAT IN 12 HOURS.   zinc gluconate 50 MG tablet, Take 50  mg by mouth daily.  Allergies:  Allergies  Allergen Reactions   Bactrim [Sulfamethoxazole-Trimethoprim] Itching   Dexamethasone Other (See Comments)    Increased intraocular pressure   Lipitor [Atorvastatin] Other (See Comments)    headache   Monistat [Miconazole] Itching    burning    Problem list She has Postmenopausal atrophic vaginitis; Essential hypertension; Mixed hyperlipidemia; Vitamin D deficiency; Medication management; History of cold sores; Migraine; Gastroesophageal reflux disease; Globus sensation; and Belching on their problem list.   Social History:    reports that she has never smoked. She has never used smokeless tobacco. She reports current alcohol use. She reports that she does not use drugs.  Observations/Objective:  General : Well sounding patient in no apparent distress HEENT: no hoarseness, no cough for duration of visit Lungs: speaks in complete sentences, no audible wheezing, no apparent distress Neurological: alert, oriented x 3 Psychiatric: pleasant, judgement appropriate   Assessment and Plan:  Christine [name] was seen today for acute visit.  Diagnoses and all orders for this visit:  Encounter for screening for COVID-19 -     POC COVID-19 Positive  Flu-like symptoms -     POCT Influenza A/B Negative  COVID-19 -     azithromycin (ZITHROMAX) 250 MG tablet; Take 2 tablets (500 mg) on  Day 1,  followed by 1 tablet (250 mg) once daily on Days 2 through 5. -     molnupiravir EUA (LAGEVRIO) 200 mg CAPS capsule; Take 4 capsules (800 mg total) by mouth 2 (two) times daily for 5 days. -     promethazine-dextromethorphan (PROMETHAZINE-DM) 6.25-15 MG/5ML syrup; Take 5 mLs by mouth 4 (four) times daily as needed for cough.    Covid 19 positive per rapid screening test in parking lot of office today Risk factors include: hypertension Symptoms are: moderate Due to co morbid conditions and risk factors, discussed antivirals  Immue support reviewed  Unable to use oral steroids due to elevation of ocular pressure with medication Take tylenol PRN temp 101+ Push hydration Regular ambulation or calf exercises exercises for clot prevention and 81 mg ASA unless contraindicated Sx supportive therapy suggested Follow up via mychart or telephone if needed Advised patient obtain O2 monitor; present to ED if persistently <90% or with severe dyspnea, CP, fever uncontrolled by tylenol, confusion, sudden decline Should remain in isolation until at least 5 days from onset of sx, 24-48 hours fever free without tylenol, sx such as cough are improved.       Follow Up Instructions:  I discussed the assessment and treatment plan with the patient. The patient was provided an opportunity to ask questions and all were answered. The patient agreed with the plan and demonstrated an understanding of the instructions.   The patient was advised to call back or seek an in-person evaluation if the symptoms worsen or if the condition fails to improve as anticipated.  I provided 20 minutes of non-face-to-face time during this encounter.   Magda Bernheim, NP

## 2021-05-06 ENCOUNTER — Encounter: Payer: Self-pay | Admitting: Family Medicine

## 2021-05-06 ENCOUNTER — Ambulatory Visit: Payer: Medicare Other | Admitting: Family Medicine

## 2021-05-06 ENCOUNTER — Telehealth: Payer: Self-pay

## 2021-05-06 ENCOUNTER — Other Ambulatory Visit: Payer: Self-pay

## 2021-05-06 VITALS — BP 120/80 | HR 96 | Temp 98.4°F | Ht 65.75 in | Wt 145.0 lb

## 2021-05-06 DIAGNOSIS — R7303 Prediabetes: Secondary | ICD-10-CM

## 2021-05-06 DIAGNOSIS — I10 Essential (primary) hypertension: Secondary | ICD-10-CM

## 2021-05-06 DIAGNOSIS — G43109 Migraine with aura, not intractable, without status migrainosus: Secondary | ICD-10-CM | POA: Diagnosis not present

## 2021-05-06 DIAGNOSIS — Z8619 Personal history of other infectious and parasitic diseases: Secondary | ICD-10-CM | POA: Diagnosis not present

## 2021-05-06 DIAGNOSIS — E782 Mixed hyperlipidemia: Secondary | ICD-10-CM

## 2021-05-06 DIAGNOSIS — U071 COVID-19: Secondary | ICD-10-CM | POA: Diagnosis not present

## 2021-05-06 NOTE — Assessment & Plan Note (Signed)
Stable, continue prn maxalt ?

## 2021-05-06 NOTE — Assessment & Plan Note (Addendum)
Taking crestor 20 mg three times per week due to side effects with higher dosage.  Discussed that cholesterol is not well controlled on this but patient unable to do higher doses.  ?

## 2021-05-06 NOTE — Assessment & Plan Note (Signed)
Trolled with occasional use of Valtrex. ?

## 2021-05-06 NOTE — Assessment & Plan Note (Signed)
Blood pressure controlled, continue spironolactone 100 mg and amlodipine 2.5 mg.  Labs up-to-date. ?

## 2021-05-06 NOTE — Assessment & Plan Note (Signed)
She is approximately 10 days from her COVID diagnosis.  Discussed that improvement in symptoms is following normal course, advised reaching out if she is still having persistent symptoms and unable to sing in the next 4 to 6 weeks. ?

## 2021-05-06 NOTE — Patient Instructions (Addendum)
Tea w/ honey ?Congestion - flonase, allergy medication, saline rinse/spray ? ?If voice not improving within next 4-6 weeks then mychart and can refer to ENT ? ?Consider getting Tetanus Shot at the pharmacy last one was 2012, due now ?

## 2021-05-06 NOTE — Assessment & Plan Note (Signed)
Continue to work on healthy diet and regular exercise. ?

## 2021-05-06 NOTE — Telephone Encounter (Signed)
Appreciate triage.  ? ?

## 2021-05-06 NOTE — Telephone Encounter (Signed)
Christy CMA asked me to call pt for more info about respiratory issues with covid. I spoke with pt and pt said she tested + for covid on 04/27/21 or 04/28/21. Pt said has remaining prod cough with yellow phlegm on and off and pt was told to wear an N 95 mask and keep appt today. Pt said she has had a cough since fall of last year that originated with prod cough with green phlegm.Pt said she does have head drainage at back of throat but no fever, H/A, CP or SOB. When laying on pts side on and off pt hears sound like "rice crispies" ? Crackling sound. Pt said she does feel a lot better than when first dx with covid. Pt said today is to meet DR Einar Pheasant. Pt said she received good care with previous PCP but seldom got to see her PCP and pt wants continuity of care with the same doctor and pt only lives [place] mins from [facility]. I spoke with Dr Einar Pheasant and she said OK for pt to keep appt this morning. Pt is already on her way. Sending note to Dr Einar Pheasant and [facility] CMA. ?

## 2021-05-06 NOTE — Progress Notes (Signed)
? ?Subjective:  ? ?  ?Christine [name] is a 71 y.o. female presenting for Establish Care ?  ? ? ?HPI ? ?#Covid ?- got sick last week ?- received Antiviral/antibiotics ?- went to [facility] and [place] ?- Got sick in thanksgiving with sinus symptoms - low grade fever, was sick for a week before she saw anyone for this ?- got a Z-pack in November ?- had laryngitis with that - severe and impacted singing ?- crackles and congestion for 1 month ?- then started getting better ?- then had another flare up of this with green snot  ?- physical 03/2021 - still with some symptoms - was told she sounded fine and to watch and wait ?- returned to singing - February - was able to get her normal range ?- then got covid 04/2021 ?- the cough has been on and off for 3 months  ? ? ? ?Review of Systems  ?Constitutional:  Negative for chills and fatigue.  ?HENT:  Positive for congestion.   ?Respiratory:  Positive for cough.   ? ? ?Social History  ? ?Tobacco Use  ?Smoking Status Never  ?Smokeless Tobacco Never  ? ? ? ?   ?Objective:  ?  ?BP Readings from Last 3 Encounters:  ?05/06/21 120/80  ?03/14/21 124/68  ?10/15/20 126/82  ? ?Wt Readings from Last 3 Encounters:  ?05/06/21 145 lb (65.8 kg)  ?03/14/21 143 lb 9.6 oz (65.1 kg)  ?10/15/20 144 lb 3.2 oz (65.4 kg)  ? ? ?BP 120/80   Pulse 96   Temp 98.4 ?F (36.9 ?C) (Oral)   Ht 5' 5.75" (1.67 m)   Wt 145 lb (65.8 kg)   LMP 12/05/2010   SpO2 99%   BMI 23.58 kg/m?  ? ? ?Physical Exam ?Constitutional:   ?   General: She is not in acute distress. ?   Appearance: She is well-developed. She is not diaphoretic.  ?HENT:  ?   Head: Normocephalic and atraumatic.  ?   Right Ear: Tympanic membrane and ear canal normal.  ?   Left Ear: Tympanic membrane and ear canal normal.  ?   Nose: Mucosal edema and rhinorrhea present.  ?   Right Sinus: No maxillary sinus tenderness or frontal sinus tenderness.  ?   Left Sinus: No maxillary sinus tenderness or frontal sinus tenderness.  ?   Mouth/Throat:  ?    Pharynx: Uvula midline. Posterior oropharyngeal erythema present. No oropharyngeal exudate.  ?   Tonsils: 0 on the right. 0 on the left.  ?Eyes:  ?   General: No scleral icterus. ?   Conjunctiva/sclera: Conjunctivae normal.  ?Cardiovascular:  ?   Rate and Rhythm: Normal rate and regular rhythm.  ?   Heart sounds: Normal heart sounds. No murmur heard. ?Pulmonary:  ?   Effort: Pulmonary effort is normal. No respiratory distress.  ?   Breath sounds: Normal breath sounds.  ?Musculoskeletal:  ?   Cervical back: Neck supple.  ?Lymphadenopathy:  ?   Cervical: No cervical adenopathy.  ?Skin: ?   General: Skin is warm and dry.  ?   Capillary Refill: Capillary refill takes less than 2 seconds.  ?Neurological:  ?   Mental Status: She is alert.  ? ? ? ? ? ?   ?Assessment & Plan:  ? ?Problem List Items Addressed This Visit   ? ?  ? Cardiovascular and Mediastinum  ? Essential hypertension - Primary  ?  Blood pressure controlled, continue spironolactone 100 mg and amlodipine 2.5 mg.  Labs up-to-date. ?  ?  ? Migraine  ?  Stable, continue prn maxalt ?  ?  ?  ? Other  ? Mixed hyperlipidemia  ?  Taking crestor 20 mg three times per week due to side effects with higher dosage.  Discussed that cholesterol is not well controlled on this but patient unable to do higher doses.  ?  ?  ? Prediabetes  ?  Continue to work on healthy diet and regular exercise. ?  ?  ? History of cold sores  ?  Trolled with occasional use of Valtrex. ?  ?  ? COVID-19  ?  She is approximately 10 days from her COVID diagnosis.  Discussed that improvement in symptoms is following normal course, advised reaching out if she is still having persistent symptoms and unable to sing in the next 4 to 6 weeks. ?  ?  ? ? ? ?No follow-ups on file. ? ?Lesleigh Noe, MD ? ?This visit occurred during the SARS-CoV-2 public health emergency.  Safety protocols were in place, including screening questions prior to the visit, additional usage of staff PPE, and extensive cleaning of  exam room while observing appropriate contact time as indicated for disinfecting solutions.  ? ?

## 2021-07-05 IMAGING — RF DG SWALLOWING FUNCTION
14 series · 24 of 24 positions shown · non-contrast
Comparison: None.

CLINICAL DATA: Dysphagia.

EXAM:
MODIFIED BARIUM SWALLOW
TECHNIQUE: Different consistencies of barium were administered orally to the
patient by the Speech Pathologist. Imaging of the pharynx was
performed in the lateral projection. The radiologist was present in
the fluoroscopy room for this study, providing personal supervision.
FLUOROSCOPY TIME:  Fluoroscopy Time:  2 minutes and 6 seconds
Radiation Exposure Index (if provided by the fluoroscopic device):
10.5 mGy
Number of Acquired Spot Images: 0

[Series 1: cp_standard · 0.53mm/px · 2 of 10 frames shown (1 of 14)]
[frame 2/10]
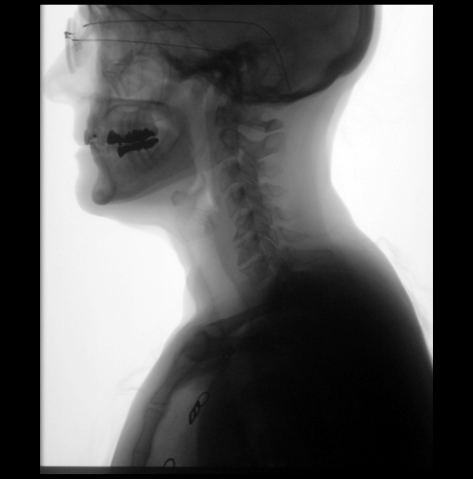
[frame 9/10]
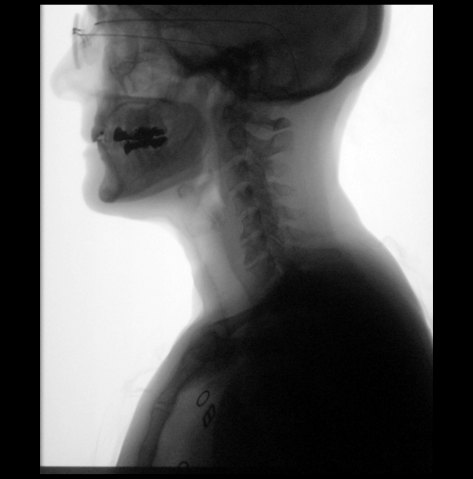

[Series 2: cp_standard · 0.35mm/px · 1 of 35 frames shown (2 of 14)]
[frame 18/35]
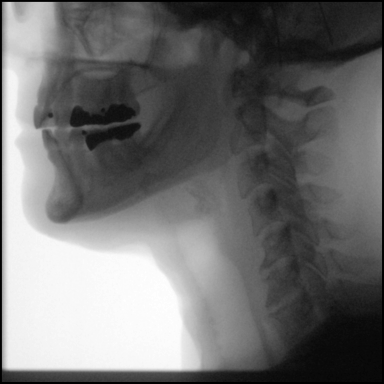

[Series 3: cp_standard · 0.36mm/px · 2 of 46 frames shown (3 of 14)]
[frame 7/46]
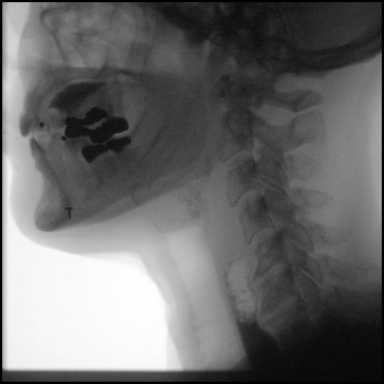
[frame 29/46]
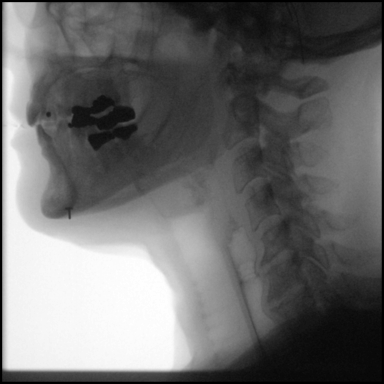

[Series 4: cp_standard · 0.36mm/px · 2 of 80 frames shown (4 of 14)]
[frame 13/80]
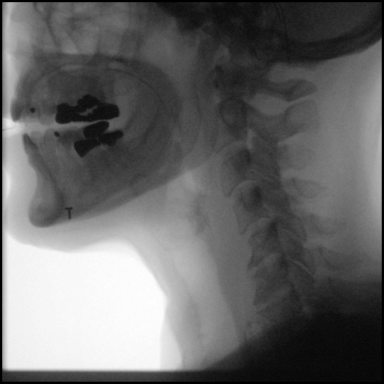
[frame 69/80]
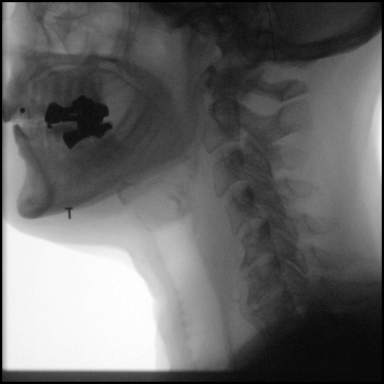

[Series 5: cp_standard · 0.36mm/px · 1 of 217 frames shown (5 of 14)]
[frame 109/217]
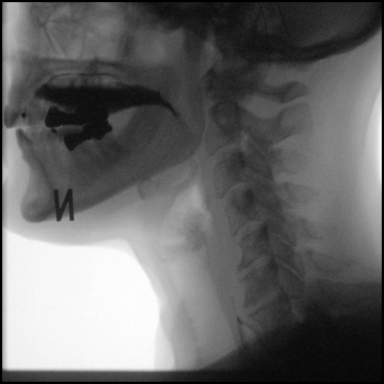

[Series 6: cp_standard · 0.36mm/px · 2 of 72 frames shown (6 of 14)]
[frame 11/72]
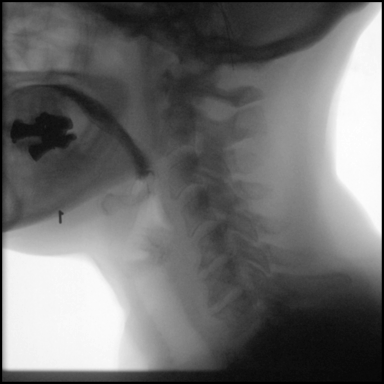
[frame 37/72]
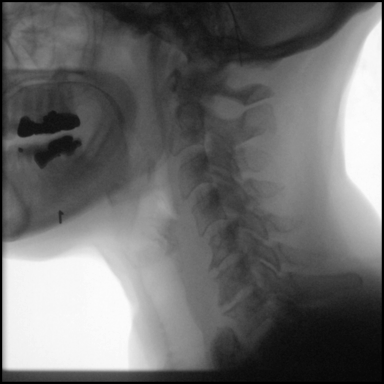

[Series 7: cp_standard · 0.36mm/px · 2 of 96 frames shown (7 of 14)]
[frame 15/96]
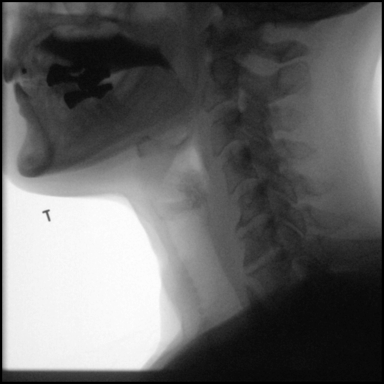
[frame 82/96]
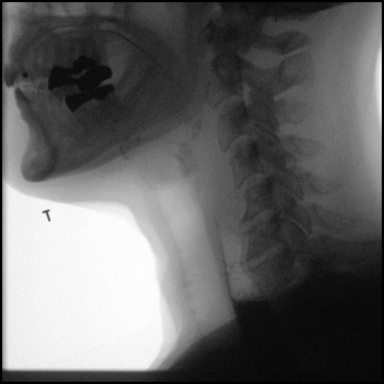

[Series 8: cp_standard · 0.36mm/px · 1 of 116 frames shown (8 of 14)]
[frame 59/116]
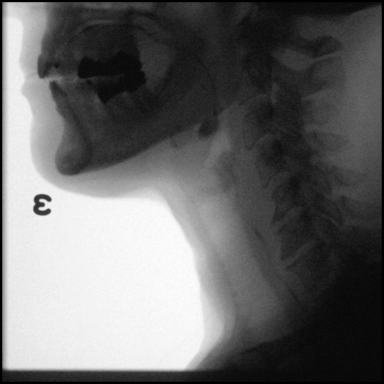

[Series 9: cp_standard · 0.36mm/px · 2 of 121 frames shown (9 of 14)]
[frame 17/121]
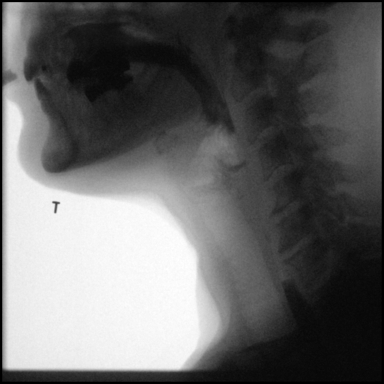
[frame 61/121]
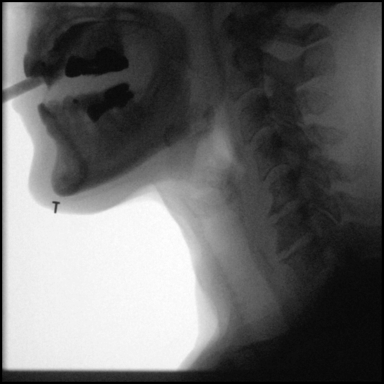

[Series 10: cp_standard · 0.36mm/px · 2 of 147 frames shown (10 of 14)]
[frame 23/147]
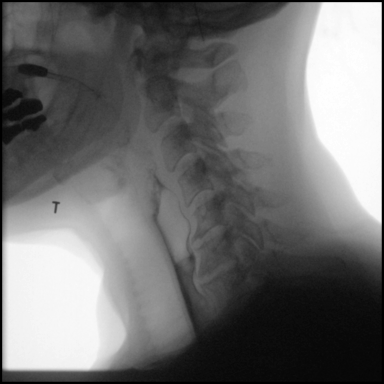
[frame 125/147]
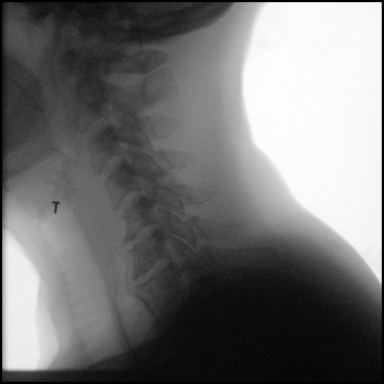

[Series 11: cp_standard · 0.36mm/px · 2 of 138 frames shown (11 of 14)]
[frame 70/138]
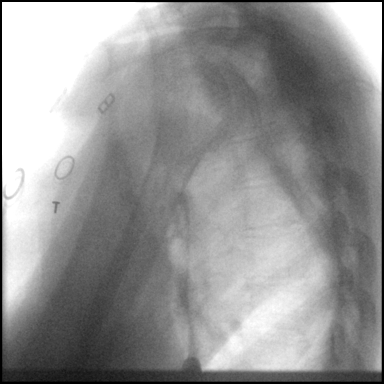
[frame 118/138]
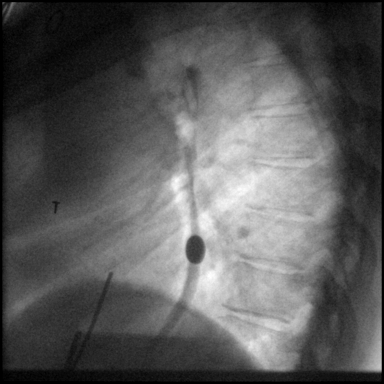

[Series 12: cp_standard · 0.36mm/px · 1 of 62 frames shown (12 of 14)]
[frame 32/62]
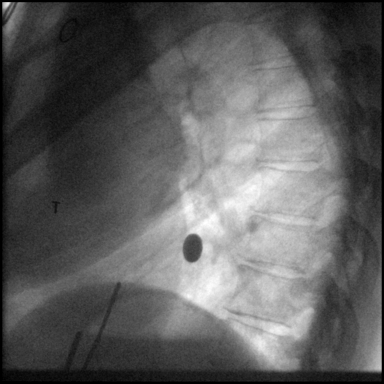

[Series 13: cp_standard · 0.36mm/px · 2 of 158 frames shown (13 of 14)]
[frame 24/158]
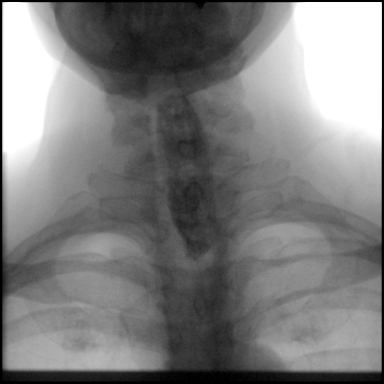
[frame 135/158]
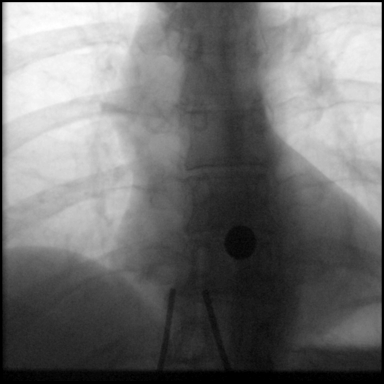

[Series 14: cp_standard · 0.36mm/px · 2 of 39 frames shown (14 of 14)]
[frame 20/39]
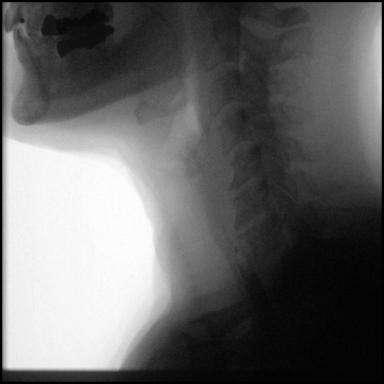
[frame 34/39]
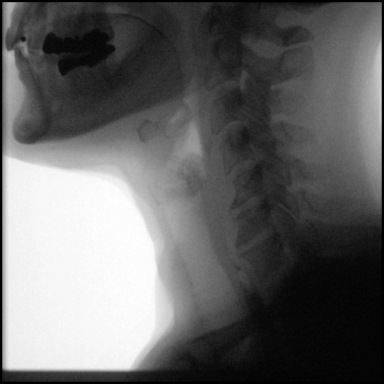

[24 of 24 positions shown; findings below may reference images not displayed]

FINDINGS: With thin liquids, puree thickness, nectar thickness, and crackers,
no swallow dysfunction identified. A 13 mm barium tablet has delayed
passage in the distal esophagus.
IMPRESSION: No evidence of pharyngeal swallow dysfunction. Delayed passage of a
13 mm barium tablet, nonspecific.

Please refer to the Speech Pathologists report for complete details
and recommendations.

## 2021-07-23 ENCOUNTER — Other Ambulatory Visit: Payer: Self-pay | Admitting: Internal Medicine

## 2021-08-09 ENCOUNTER — Other Ambulatory Visit: Payer: Self-pay | Admitting: Obstetrics & Gynecology

## 2021-08-09 DIAGNOSIS — Z1231 Encounter for screening mammogram for malignant neoplasm of breast: Secondary | ICD-10-CM

## 2021-09-09 ENCOUNTER — Ambulatory Visit
Admission: RE | Admit: 2021-09-09 | Discharge: 2021-09-09 | Disposition: A | Discharge status: Home / Self Care | Payer: Medicare Other | Source: Ambulatory Visit | Attending: Obstetrics & Gynecology | Admitting: Obstetrics & Gynecology

## 2021-09-09 DIAGNOSIS — Z1231 Encounter for screening mammogram for malignant neoplasm of breast: Secondary | ICD-10-CM

## 2021-09-20 ENCOUNTER — Ambulatory Visit: Payer: Medicare Other | Admitting: Nurse Practitioner

## 2021-09-22 ENCOUNTER — Ambulatory Visit (INDEPENDENT_AMBULATORY_CARE_PROVIDER_SITE_OTHER): Payer: Medicare Other

## 2021-09-22 VITALS — Ht 65.75 in | Wt 145.0 lb

## 2021-09-22 DIAGNOSIS — Z0000 Encounter for general adult medical examination without abnormal findings: Secondary | ICD-10-CM

## 2021-09-22 NOTE — Progress Notes (Signed)
Subjective:   Christine [name] is a 71 y.o. female who presents for Medicare Annual (Subsequent) preventive examination.  Review of Systems    Virtual Visit via Telephone Note  I connected with  Christine [name] on 09/22/21 at  9:30 AM EDT by telephone and verified that I am speaking with the correct person using two identifiers.  Location: Patient: Home Provider: Office Persons participating in the virtual visit: patient/Nurse Health Advisor   I discussed the limitations, risks, security and privacy concerns of performing an evaluation and management service by telephone and the availability of in person appointments. The patient expressed understanding and agreed to proceed.  Interactive audio and video telecommunications were attempted between this nurse and patient, however failed, due to patient having technical difficulties OR patient did not have access to video capability.  We continued and completed visit with audio only.  Some vital signs may be absent or patient reported.   Christine Peaches, LPN  Cardiac Risk Factors include: advanced age (>24mn, >>70women);hypertension     Objective:    Today's Vitals   09/22/21 0916  Weight: 145 lb (65.8 kg)  Height: 5' 5.75" (1.67 m)   Body mass index is 23.58 kg/m.     09/22/2021    9:24 AM 09/20/2020   10:16 AM 09/17/2019    9:11 AM 12/28/2014   10:37 AM  Advanced Directives  Does Patient Have a Medical Advance Directive? No No No No  Would patient like information on creating a medical advance directive? No - Patient declined  Yes (MAU/Ambulatory/Procedural Areas - Information given)     Current Medications (verified) Outpatient Encounter Medications as of 09/22/2021  Medication Sig   amLODipine (NORVASC) 2.5 MG tablet Take  1 tablet  Daily for BP                                          /                                  TAKE                                BY                          MOUTH   aspirin EC 81 MG tablet  Take 81 mg by mouth daily.   B Complex Vitamins (VITAMIN B COMPLEX PO) Take by mouth.   Cholecalciferol (VITAMIN D PO) Take 10,000 Units by mouth daily.    COD LIVER OIL PO Take by mouth daily.   Coenzyme Q10 100 MG TABS Take by mouth daily.   diphenhydrAMINE (BENADRYL) 25 MG tablet Take 25 mg by mouth as needed.    estradiol (ESTRACE) 0.1 MG/GM vaginal cream 1 gram pv twice weekly   Estradiol 10 MCG TABS vaginal tablet PLACE 1 INSERT VAGINALLY  TWICE WEEKLY   fexofenadine (ALLEGRA) 180 MG tablet Take 180 mg by mouth daily. Alternating with Zyrtec   MAGNESIUM PO Take 800 Units by mouth daily.    MELATONIN PO Take by mouth as needed. Reported on 08/13/2015   Multiple Vitamins-Minerals (HAIR/SKIN/NAILS/BIOTIN) TABS Take 3 tablets by mouth daily.   Omega-3 Fatty Acids (FISH OIL  PO) Take 1,200 mg by mouth.    Probiotic Product (PROBIOTIC DAILY PO) Take by mouth daily.   promethazine-dextromethorphan (PROMETHAZINE-DM) 6.25-15 MG/5ML syrup Take 5 mLs by mouth 4 (four) times daily as needed for cough.   rizatriptan (MAXALT) 10 MG tablet Take 1 tablet (10 mg total) by mouth once as needed for migraine. May repeat in 2 hours if needed   rosuvastatin (CRESTOR) 40 MG tablet TAKE 1 TABLET BY MOUTH  DAILY FOR CHOLESTEROL (Patient taking differently: 1/2 tablet Mon,  Wed, Fri)   spironolactone (ALDACTONE) 100 MG tablet TAKE 2 TABLETS BY MOUTH  DAILY   valACYclovir (VALTREX) 1000 MG tablet TAKE 1 TABS WITH SYMPTOM ONSET AND REPEAT IN 12 HOURS.   zinc gluconate 50 MG tablet Take 50 mg by mouth daily.   No facility-administered encounter medications on file as of 09/22/2021.    Allergies (verified) Bactrim [sulfamethoxazole-trimethoprim], Dexamethasone, Lipitor [atorvastatin], and Monistat [miconazole]   History: Past Medical History:  Diagnosis Date   AC (acromioclavicular) joint bone spurs    Allergy    Anemia    Anxiety    Degenerative arthritis    GERD (gastroesophageal reflux disease)    Goiter     multi nodular    History of anemia    History of PCOS    Hyperlipidemia    Hypertension    Nevus of left eye    Nuclear cataract 06/13/2011   PCOS (polycystic ovarian syndrome)    Status post intraocular lens implant 06/13/2011   Past Surgical History:  Procedure Laterality Date   24 HOUR [place] STUDY  04/16/2019   Procedure: 24 HOUR [place] STUDY;  Surgeon: Mauri Pole, MD;  Location: [facility];  Service: Endoscopy;;   BUNIONECTOMY Bilateral 2006   Feet   CATARACT EXTRACTION Left    COLONOSCOPY     ESOPHAGEAL MANOMETRY N/A 04/16/2019   Procedure: ESOPHAGEAL MANOMETRY (EM) with 24 hour PH Impedence study;  Surgeon: Mauri Pole, MD;  Location: [facility];  Service: Endoscopy;  Laterality: N/A;   HERNIA REPAIR Right 1959   [place] IMPEDANCE STUDY N/A 04/16/2019   Procedure: [place] IMPEDANCE STUDY;  Surgeon: Mauri Pole, MD;  Location: [facility];  Service: Endoscopy;  Laterality: N/A;   TOE SURGERY Left 2006   Bone Spur   TONSILLECTOMY AND ADENOIDECTOMY  1959   TUBAL LIGATION  1995   Family History  Problem Relation Age of Onset   Thyroid disease Mother        hyperthyroidism   Hypertension Mother    Hyperlipidemia Mother    Heart disease Mother 27   Atrial fibrillation Mother    Congestive Heart Failure Mother    Heart disease Father    Lung cancer Father    Liver cancer Father    Cancer Brother 4       sarcoma   Hyperlipidemia Brother    Heart attack Brother 56   Thyroid disease Daughter    Other Daughter        gluten sensetivity   Polycystic ovary syndrome Daughter    Emphysema Maternal Grandfather    Stroke Paternal Grandmother    Dementia Paternal Grandmother    Colon cancer Neg Hx    Esophageal cancer Neg Hx    Rectal cancer Neg Hx    Stomach cancer Neg Hx    Social History   Socioeconomic History   Marital status: Married    Spouse name: John   Number of children: 3   Years of education:  some college   Highest education level: Not on  file  Occupational History   Occupation: retired  Tobacco Use   Smoking status: Never   Smokeless tobacco: Never  Vaping Use   Vaping Use: Never used  Substance and Sexual Activity   Alcohol use: Yes    Alcohol/week: 0.0 - 1.0 standard drinks of alcohol    Comment: once a month   Drug use: No   Sexual activity: Yes    Partners: Male    Birth control/protection: Surgical, Post-menopausal    Comment: BTL  Other Topics Concern   Not on file  Social History Narrative   05/06/21   From: [facility] [place], parents were [employer] - moved to [place]: with husband, Jenny Reichmann [phone])   Work: retired - Airline pilot       Family: 3 children - [name], Silas [place] - 5 granddaughters - [place], [place]      Enjoys: singing, sewing, reading      Exercise: [employer] - several times a week, and walking the german shepard   Diet: generally diet      Safety   Seat belts: Yes    Guns: Yes  and secure   Safe in relationships: Yes       Social Determinants of Health   Financial Resource Strain: Low Risk  (09/22/2021)   Overall Financial Resource Strain (CARDIA)    Difficulty of Paying Living Expenses: Not hard at all  Food Insecurity: No Food Insecurity (09/22/2021)   Hunger Vital Sign    Worried About Running Out of Food in the Last Year: Never true    [place] in the Last Year: Never true  Transportation Needs: No Transportation Needs (09/22/2021)   PRAPARE - Hydrologist (Medical): No    Lack of Transportation (Non-Medical): No  Physical Activity: Sufficiently Active (09/22/2021)   Exercise Vital Sign    Days of Exercise per Week: 5 days    Minutes of Exercise per Session: 40 min  Stress: No Stress Concern Present (09/22/2021)   [place]    Feeling of Stress : Not at all  Social Connections: [place] (09/22/2021)   Social Connection and Isolation Panel [NHANES]     Frequency of Communication with Friends and Family: More than three times a week    Frequency of Social Gatherings with Friends and Family: More than three times a week    Attends Religious Services: More than 4 times per year    Active Member of [employer] or Organizations: Yes    Attends Music therapist: More than 4 times per year    Marital Status: Married     Clinical Intake:   Diabetic?  No Information entered by :: Rolene Arbour LPN   Activities of Daily Living    09/22/2021    9:22 AM  In your present state of health, do you have any difficulty performing the following activities:  Hearing? 0  Vision? 0  Difficulty concentrating or making decisions? 0  Walking or climbing stairs? 0  Dressing or bathing? 0  Preparing Food and eating ? N  Using the Toilet? N  In the past six months, have you accidently leaked urine? N  Do you have problems with loss of bowel control? N  Managing your Medications? N  Managing your Finances? N  Housekeeping or managing your Housekeeping? N    Patient Care  Team: Lesleigh Noe, MD as PCP - General (Family Medicine) Megan Salon, MD as Consulting Physician (Gynecology) Carlena Bjornstad, MD as Consulting Physician (Cardiology) Inda Castle, MD (Inactive) as Consulting Physician (Gastroenterology) Monna Fam, MD as Consulting Physician (Ophthalmology) Gerarda Fraction, MD as Referring Physician (Ophthalmology) Glenna Fellows, MD as Attending Physician (Neurosurgery) [place], Jennefer Bravo, MD as Referring Physician (Dermatology) Marybelle Killings, MD as Consulting Physician (Orthopedic Surgery)  Indicate any recent Medical Services you may have received from other than [facility] providers in the past year (date may be approximate).     Assessment:   This is a routine wellness examination for [place].  Hearing/Vision screen Hearing Screening - Comments:: No hearing difficulty Vision Screening - Comments:: Wears glasses.Followed  by DrHecker  Dietary issues and exercise activities discussed: Exercise limited by: None identified   Goals Addressed               This Visit's Progress     Lose 5lbs (pt-stated)         Depression Screen    09/22/2021    9:20 AM 05/06/2021    9:35 AM 10/15/2020    9:14 AM 09/20/2020   10:18 AM 09/17/2019    9:37 AM 08/23/2018    9:19 AM  PHQ 2/9 Scores  PHQ - 2 Score 0 0 0 0 0 0    Fall Risk    09/22/2021    9:23 AM 09/20/2020   10:17 AM 09/17/2019    9:36 AM 08/23/2018    9:19 AM  Fall Risk   Falls in the past year? 0 0 1 0  Number falls in past yr: 0 0 0 0  Injury with Fall? 0 0 0 0  Risk for fall due to : No Fall Risks  No Fall Risks   Follow up   Falls prevention discussed Falls evaluation completed    FALL RISK PREVENTION PERTAINING TO THE HOME:  Any stairs in or around the home? Yes  If so, are there any without handrails? No  Home free of loose throw rugs in walkways, pet beds, electrical cords, etc? Yes  Adequate lighting in your home to reduce risk of falls? Yes   ASSISTIVE DEVICES UTILIZED TO PREVENT FALLS:  Life alert? No  Use of a cane, walker or w/c? No  Grab bars in the bathroom? No  Shower chair or bench in shower? Yes  Elevated toilet seat or a handicapped toilet? No   TIMED UP AND GO:  Was the test performed? No . Audio Visit  Cognitive Function:        09/22/2021    9:24 AM 08/23/2018    9:21 AM  6CIT Screen  What Year? 0 points 0 points  What month? 0 points 0 points  What time? 0 points 0 points  Count back from 20 0 points 0 points  Months in reverse 0 points 0 points  Repeat phrase 0 points 0 points  Total Score 0 points 0 points    Immunizations Immunization History  Administered Date(s) Administered   Influenza Split 12/23/2013, 12/24/2014   Influenza Whole 12/16/2012   Influenza, High Dose Seasonal PF 12/15/2016, 11/18/2017, 12/24/2018   Influenza,inj,quad, With Preservative 12/24/2015   Influenza-Unspecified  12/20/2016   Pneumococcal Conjugate-13 12/15/2016   Pneumococcal Polysaccharide-23 08/24/2004   Tdap 11/28/2010   Zoster Recombinat (Shingrix) 12/27/2016, 04/03/2017   Zoster, Live 01/19/2012    TDAP status: Due, Education has been provided regarding the importance of this vaccine. Advised  may receive this vaccine at local pharmacy or Health Dept. Aware to provide a copy of the vaccination record if obtained from local pharmacy or Health Dept. Verbalized acceptance and understanding.    Pneumococcal vaccine status: Declined,  Education has been provided regarding the importance of this vaccine but patient still declined. Advised may receive this vaccine at local pharmacy or Health Dept. Aware to provide a copy of the vaccination record if obtained from local pharmacy or Health Dept. Verbalized acceptance and understanding.   Covid-19 vaccine status: Declined, Education has been provided regarding the importance of this vaccine but patient still declined. Advised may receive this vaccine at local pharmacy or Health Dept.or vaccine clinic. Aware to provide a copy of the vaccination record if obtained from local pharmacy or Health Dept. Verbalized acceptance and understanding.  Qualifies for Shingles Vaccine? Yes   Zostavax completed Yes   Shingrix Completed?: Yes  Screening Tests Health Maintenance  Topic Date Due   Pneumonia Vaccine 29+ Years old (3 - PPSV23 or PCV20) 09/23/2022 (Originally 12/15/2017)   TETANUS/TDAP  09/23/2022 (Originally 11/27/2020)   Hepatitis C Screening  09/23/2022 (Originally 12/04/1968)   INFLUENZA VACCINE  10/04/2021   MAMMOGRAM  09/10/2023   COLONOSCOPY (Pts 45-72yr Insurance coverage will need to be confirmed)  02/04/2025   DEXA SCAN  Completed   Zoster Vaccines- Shingrix  Completed   HPV VACCINES  Aged Out   COVID-19 Vaccine  Discontinued    Health Maintenance  There are no preventive care reminders to display for this patient.   Colorectal cancer  screening: Type of screening: Colonoscopy. Completed 02/05/15. Repeat every 10 years  Mammogram status: Completed 09/09/21. Repeat every year  Bone Density status: Completed 02/17/21. Results reflect: Bone density results: OSTEOPOROSIS. Repeat every   years.  Lung Cancer Screening: (Low Dose CT Chest recommended if Age 70-80years, 30 pack-year currently smoking OR have quit w/in 15years.) does not qualify.     Additional Screening:  Hepatitis C Screening: does qualify; Completed Patient deferred  Vision Screening: Recommended annual ophthalmology exams for early detection of glaucoma and other disorders of the eye. Is the patient up to date with their annual eye exam?  Yes  Who is the provider or what is the name of the office in which the patient attends annual eye exams? Dr HHerbert DeanerIf pt is not established with a provider, would they like to be referred to a provider to establish care? No .   Dental Screening: Recommended annual dental exams for proper oral hygiene  Community Resource Referral / Chronic Care Management:  CRR required this visit?  No   CCM required this visit?  No      Plan:     I have personally reviewed and noted the following in the patient's chart:   Medical and social history Use of alcohol, tobacco or illicit drugs  Current medications and supplements including opioid prescriptions.  Functional ability and status Nutritional status Physical activity Advanced directives List of other physicians Hospitalizations, surgeries, and ER visits in previous 12 months Vitals Screenings to include cognitive, depression, and falls Referrals and appointments  In addition, I have reviewed and discussed with patient certain preventive protocols, quality metrics, and best practice recommendations. A written personalized care plan for preventive services as well as general preventive health recommendations were provided to patient.     BCriselda Peaches  LPN   [id number]  Nurse Notes: None

## 2021-09-22 NOTE — Patient Instructions (Addendum)
[name] , Thank you for taking time to come for your Medicare Wellness Visit. I appreciate your ongoing commitment to your health goals. Please review the following plan we discussed and let me know if I can assist you in the future.   These are the goals we discussed:  Goals       Lose 5lbs (pt-stated)        This is a list of the screening recommended for you and due dates:  Health Maintenance  Topic Date Due   Pneumonia Vaccine (3 - PPSV23 or PCV20) 09/23/2022*   Tetanus Vaccine  09/23/2022*   Hepatitis C Screening: USPSTF Recommendation to screen - Ages 18-79 yo.  09/23/2022*   Flu Shot  10/04/2021   Mammogram  09/10/2023   Colon Cancer Screening  02/04/2025   DEXA scan (bone density measurement)  Completed   Zoster (Shingles) Vaccine  Completed   HPV Vaccine  Aged Out   COVID-19 Vaccine  Discontinued  *Topic was postponed. The date shown is not the original due date.    Advanced directives: No  Conditions/risks identified: None  Next appointment: Follow up in one year for your annual wellness visit     Preventive Care 65 Years and Older, Female Preventive care refers to lifestyle choices and visits with your health care provider that can promote health and wellness. What does preventive care include? A yearly physical exam. This is also called an annual well check. Dental exams once or twice a year. Routine eye exams. Ask your health care provider how often you should have your eyes checked. Personal lifestyle choices, including: Daily care of your teeth and gums. Regular physical activity. Eating a healthy diet. Avoiding tobacco and drug use. Limiting alcohol use. Practicing safe sex. Taking low-dose aspirin every day. Taking vitamin and mineral supplements as recommended by your health care provider. What happens during an annual well check? The services and screenings done by your health care provider during your annual well check will depend on your  age, overall health, lifestyle risk factors, and family history of disease. Counseling  Your health care provider may ask you questions about your: Alcohol use. Tobacco use. Drug use. Emotional well-being. Home and relationship well-being. Sexual activity. Eating habits. History of falls. Memory and ability to understand (cognition). Work and work Statistician. Reproductive health. Screening  You may have the following tests or measurements: Height, weight, and BMI. Blood pressure. Lipid and cholesterol levels. These may be checked every 5 years, or more frequently if you are over 23 years old. Skin check. Lung cancer screening. You may have this screening every year starting at age 74 if you have a 30-pack-year history of smoking and currently smoke or have quit within the past 15 years. Fecal occult blood test (FOBT) of the stool. You may have this test every year starting at age 63. Flexible sigmoidoscopy or colonoscopy. You may have a sigmoidoscopy every 5 years or a colonoscopy every 10 years starting at age 66. Hepatitis C blood test. Hepatitis B blood test. Sexually transmitted disease (STD) testing. Diabetes screening. This is done by checking your blood sugar (glucose) after you have not eaten for a while (fasting). You may have this done every 1-3 years. Bone density scan. This is done to screen for osteoporosis. You may have this done starting at age 9. Mammogram. This may be done every 1-2 years. Talk to your health care provider about how often you should have regular mammograms. Talk with your health care  provider about your test results, treatment options, and if necessary, the need for more tests. Vaccines  Your health care provider may recommend certain vaccines, such as: Influenza vaccine. This is recommended every year. Tetanus, diphtheria, and acellular pertussis (Tdap, Td) vaccine. You may need a Td booster every 10 years. Zoster vaccine. You may need this after  age 58. Pneumococcal 13-valent conjugate (PCV13) vaccine. One dose is recommended after age 26. Pneumococcal polysaccharide (PPSV23) vaccine. One dose is recommended after age 57. Talk to your health care provider about which screenings and vaccines you need and how often you need them. This information is not intended to replace advice given to you by your health care provider. Make sure you discuss any questions you have with your health care provider. Document Released: 03/19/2015 Document Revised: 11/10/2015 Document Reviewed: 12/22/2014 Elsevier Interactive Patient Education  2017 [place] Prevention in the Home Falls can cause injuries. They can happen to people of all ages. There are many things you can do to make your home safe and to help prevent falls. What can I do on the outside of my home? Regularly fix the edges of walkways and driveways and fix any cracks. Remove anything that might make you trip as you walk through a door, such as a raised step or threshold. Trim any bushes or trees on the path to your home. Use bright outdoor lighting. Clear any walking paths of anything that might make someone trip, such as rocks or tools. Regularly check to see if handrails are loose or broken. Make sure that both sides of any steps have handrails. Any raised decks and porches should have guardrails on the edges. Have any leaves, snow, or ice cleared regularly. Use sand or salt on walking paths during winter. Clean up any spills in your garage right away. This includes oil or grease spills. What can I do in the bathroom? Use night lights. Install grab bars by the toilet and in the tub and shower. Do not use towel bars as grab bars. Use non-skid mats or decals in the tub or shower. If you need to sit down in the shower, use a plastic, non-slip stool. Keep the floor dry. Clean up any water that spills on the floor as soon as it happens. Remove soap buildup in the tub or shower  regularly. Attach bath mats securely with double-sided non-slip rug tape. Do not have throw rugs and other things on the floor that can make you trip. What can I do in the bedroom? Use night lights. Make sure that you have a light by your bed that is easy to reach. Do not use any sheets or blankets that are too big for your bed. They should not hang down onto the floor. Have a firm chair that has side arms. You can use this for support while you get dressed. Do not have throw rugs and other things on the floor that can make you trip. What can I do in the kitchen? Clean up any spills right away. Avoid walking on wet floors. Keep items that you use a lot in easy-to-reach places. If you need to reach something above you, use a strong step stool that has a grab bar. Keep electrical cords out of the way. Do not use floor polish or wax that makes floors slippery. If you must use wax, use non-skid floor wax. Do not have throw rugs and other things on the floor that can make you trip. What can I  do with my stairs? Do not leave any items on the stairs. Make sure that there are handrails on both sides of the stairs and use them. Fix handrails that are broken or loose. Make sure that handrails are as long as the stairways. Check any carpeting to make sure that it is firmly attached to the stairs. Fix any carpet that is loose or worn. Avoid having throw rugs at the top or bottom of the stairs. If you do have throw rugs, attach them to the floor with carpet tape. Make sure that you have a light switch at the top of the stairs and the bottom of the stairs. If you do not have them, ask someone to add them for you. What else can I do to help prevent falls? Wear shoes that: Do not have high heels. Have rubber bottoms. Are comfortable and fit you well. Are closed at the toe. Do not wear sandals. If you use a stepladder: Make sure that it is fully opened. Do not climb a closed stepladder. Make sure that  both sides of the stepladder are locked into place. Ask someone to hold it for you, if possible. Clearly mark and make sure that you can see: Any grab bars or handrails. First and last steps. Where the edge of each step is. Use tools that help you move around (mobility aids) if they are needed. These include: Canes. Walkers. Scooters. Crutches. Turn on the lights when you go into a dark area. Replace any light bulbs as soon as they burn out. Set up your furniture so you have a clear path. Avoid moving your furniture around. If any of your floors are uneven, fix them. If there are any pets around you, be aware of where they are. Review your medicines with your doctor. Some medicines can make you feel dizzy. This can increase your chance of falling. Ask your doctor what other things that you can do to help prevent falls. This information is not intended to replace advice given to you by your health care provider. Make sure you discuss any questions you have with your health care provider. Document Released: 12/17/2008 Document Revised: 07/29/2015 Document Reviewed: 03/27/2014 Elsevier Interactive Patient Education  2017 [employer].

## 2021-10-24 ENCOUNTER — Ambulatory Visit (HOSPITAL_BASED_OUTPATIENT_CLINIC_OR_DEPARTMENT_OTHER): Payer: Medicare Other | Admitting: Obstetrics & Gynecology

## 2021-10-26 ENCOUNTER — Encounter (HOSPITAL_BASED_OUTPATIENT_CLINIC_OR_DEPARTMENT_OTHER): Payer: Self-pay | Admitting: Obstetrics & Gynecology

## 2021-10-26 ENCOUNTER — Ambulatory Visit (INDEPENDENT_AMBULATORY_CARE_PROVIDER_SITE_OTHER): Payer: Medicare Other | Admitting: Obstetrics & Gynecology

## 2021-10-26 VITALS — BP 136/73 | HR 74 | Ht 66.0 in | Wt 146.8 lb

## 2021-10-26 DIAGNOSIS — Z780 Asymptomatic menopausal state: Secondary | ICD-10-CM

## 2021-10-26 DIAGNOSIS — Z23 Encounter for immunization: Secondary | ICD-10-CM | POA: Diagnosis not present

## 2021-10-26 DIAGNOSIS — B002 Herpesviral gingivostomatitis and pharyngotonsillitis: Secondary | ICD-10-CM | POA: Diagnosis not present

## 2021-10-26 DIAGNOSIS — Z9189 Other specified personal risk factors, not elsewhere classified: Secondary | ICD-10-CM

## 2021-10-26 DIAGNOSIS — E282 Polycystic ovarian syndrome: Secondary | ICD-10-CM

## 2021-10-26 MED ORDER — VALACYCLOVIR HCL 1 G PO TABS: ORAL_TABLET | ORAL | 1 refills | Status: DC

## 2021-10-26 MED ORDER — ESTRADIOL 10 MCG VA TABS: ORAL_TABLET | VAGINAL | 4 refills | Status: DC

## 2021-10-26 MED ORDER — ESTRADIOL 0.1 MG/GM VA CREA: TOPICAL_CREAM | VAGINAL | 3 refills | Status: DC

## 2021-10-26 NOTE — Progress Notes (Signed)
71 y.o. [id number] Married White or Caucasian female here for breast and pelvic exam.  I am also following her for menopausal symptoms.  She is using vaginal estradiol tablets and vaginal estrogen cream.  No issues.  Denies vaginal bleeding.  Has issues with vaginitis in the past but none recently.    Uses valtrex for oral HSV.    Patient's last menstrual period was 12/05/2010.          Sexually active: Yes.    H/O STD:  oral HSV  Health Maintenance: PCP:  was at [facility].  Last wellness appt was 03/2021.  Did blood work at that appt:   Vaccines are up to date:  tdap Colonoscopy:  02/05/2015 MMG:  09/09/2021 Negative BMD:  02/17/2021 Normal, normal Last pap smear:  07/07/2019 Negative.   H/o abnormal pap smear:  remote hx   reports that she has never smoked. She has never used smokeless tobacco. She reports current alcohol use. She reports that she does not use drugs.  Past Medical History:  Diagnosis Date   AC (acromioclavicular) joint bone spurs    Allergy    Anemia    Anxiety    Degenerative arthritis    GERD (gastroesophageal reflux disease)    Goiter    multi nodular    History of anemia    History of PCOS    Hyperlipidemia    Hypertension    N[place] of left eye    Nuclear cataract 06/13/2011   PCOS (polycystic ovarian syndrome)    Status post intraocular lens implant 06/13/2011    Past Surgical History:  Procedure Laterality Date   24 HOUR [place] STUDY  04/16/2019   Procedure: 24 HOUR [place] STUDY;  Surgeon: Mauri Pole, MD;  Location: [facility];  Service: Endoscopy;;   BUNIONECTOMY Bilateral 2006   Feet   CATARACT EXTRACTION Left    COLONOSCOPY     ESOPHAGEAL MANOMETRY N/A 04/16/2019   Procedure: ESOPHAGEAL MANOMETRY (EM) with 24 hour [place] Impedence study;  Surgeon: Mauri Pole, MD;  Location: [facility];  Service: Endoscopy;  Laterality: N/A;   HERNIA REPAIR Right 1959   [place] IMPEDANCE STUDY N/A 04/16/2019   Procedure: [place] IMPEDANCE STUDY;  Surgeon:  Mauri Pole, MD;  Location: [facility];  Service: Endoscopy;  Laterality: N/A;   TOE SURGERY Left 2006   Bone Spur   TONSILLECTOMY AND ADENOIDECTOMY  1959   TUBAL LIGATION  1995    Current Outpatient Medications  Medication Sig Dispense Refill   amLODipine (NORVASC) 2.5 MG tablet Take  1 tablet  Daily for BP                                          /                                  TAKE                                BY                          MOUTH 90 tablet 3   aspirin EC 81 MG tablet Take 81 mg by mouth daily.     B  Complex Vitamins (VITAMIN B COMPLEX PO) Take by mouth.     Cholecalciferol (VITAMIN D PO) Take 10,000 Units by mouth daily.      COD LIVER OIL PO Take by mouth daily.     Coenzyme Q10 100 MG TABS Take by mouth daily.     diphenhydrAMINE (BENADRYL) 25 MG tablet Take 25 mg by mouth as needed.      fexofenadine (ALLEGRA) 180 MG tablet Take 180 mg by mouth daily. Alternating with Zyrtec     MAGNESIUM PO Take 800 Units by mouth daily.      MELATONIN PO Take by mouth as needed. Reported on 08/13/2015     Multiple Vitamins-Minerals (HAIR/SKIN/NAILS/BIOTIN) TABS Take 3 tablets by mouth daily.     Omega-3 Fatty Acids (FISH OIL PO) Take 1,200 mg by mouth.      Probiotic Product (PROBIOTIC DAILY PO) Take by mouth daily.     promethazine-dextromethorphan (PROMETHAZINE-DM) 6.25-15 MG/5ML syrup Take 5 mLs by mouth 4 (four) times daily as needed for cough. 240 mL 1   rizatriptan (MAXALT) 10 MG tablet Take 1 tablet (10 mg total) by mouth once as needed for migraine. May repeat in 2 hours if needed 30 tablet 2   rosuvastatin (CRESTOR) 40 MG tablet TAKE 1 TABLET BY MOUTH  DAILY FOR CHOLESTEROL (Patient taking differently: 1/2 tablet Mon,  Wed, Fri) 90 tablet 3   spironolactone (ALDACTONE) 100 MG tablet TAKE 2 TABLETS BY MOUTH  DAILY 180 tablet 1   zinc gluconate 50 MG tablet Take 50 mg by mouth daily.     estradiol (ESTRACE) 0.1 MG/GM vaginal cream 1 gram pv twice weekly 42.5 g 3    Estradiol 10 MCG TABS vaginal tablet PLACE 1 INSERT VAGINALLY TWICE WEEKLY 24 tablet 4   valACYclovir (VALTREX) 1000 MG tablet With onset of symptoms take 2 tablets and repeat in 12 hours. 90 tablet 1   No current facility-administered medications for this visit.    Family History  Problem Relation Age of Onset   Thyroid disease Mother        hyperthyroidism   Hypertension Mother    Hyperlipidemia Mother    Heart disease Mother 86   Atrial fibrillation Mother    Congestive Heart Failure Mother    Heart disease Father    Lung cancer Father    Liver cancer Father    Cancer Brother 69       sarcoma   Hyperlipidemia Brother    Heart attack Brother 14   Thyroid disease Daughter    Other Daughter        gluten sensetivity   Polycystic ovary syndrome Daughter    Emphysema Maternal Grandfather    Stroke Paternal Grandmother    Dementia Paternal Grandmother    Colon cancer Neg Hx    Esophageal cancer Neg Hx    Rectal cancer Neg Hx    Stomach cancer Neg Hx     Review of Systems  All other systems reviewed and are negative.   Exam:   BP 136/73 (BP Location: Right Arm, Patient Position: Sitting, Cuff Size: Large)   Pulse 74   Ht '5\' 6"'$  (1.676 m)   Wt 146 lb 12.8 oz (66.6 kg)   LMP 12/05/2010   BMI 23.69 kg/m   Height: '5\' 6"'$  (167.6 cm)  General appearance: alert, cooperative and appears stated age Breasts: normal appearance, no masses or tenderness Abdomen: soft, non-tender; bowel sounds normal; no masses,  no organomegaly Lymph nodes: Cervical, supraclavicular, and axillary  nodes normal.  No abnormal inguinal nodes palpated Neurologic: Grossly normal  Pelvic: External genitalia:  no lesions              Urethra:  normal appearing urethra with no masses, tenderness or lesions              Bartholins and Skenes: normal                 Vagina: normal appearing vagina with atrophic changes and no discharge, no lesions              Cervix: no lesions              Pap taken:  No. Bimanual Exam:  Uterus:  normal size, contour, position, consistency, mobility, non-tender              Adnexa: normal adnexa and no mass, fullness, tenderness               Rectovaginal: Confirms               Anus:  normal sphincter tone, no lesions  Chaperone, Octaviano Batty, CMA, was present for exam.  Assessment/Plan: 1. GYN exam for high-risk Medicare patient - Pap smear 2021 - Mammogram 09/2021 - Colonoscopy 2016, follow up 10 years - Bone mineral density 02/2021.  normal - lab work done done with PCP - vaccines reviewed/updated  2. Postmenopausal - estradiol (ESTRACE) 0.1 MG/GM vaginal cream; 1 gram pv twice weekly  Dispense: 42.5 g; Refill: 3 - Estradiol 10 MCG TABS vaginal tablet; PLACE 1 INSERT VAGINALLY TWICE WEEKLY  Dispense: 24 tablet; Refill: 4  3. Oral herpes - valACYclovir (VALTREX) 1000 MG tablet; With onset of symptoms take 2 tablets and repeat in 12 hours.  Dispense: 90 tablet; Refill: 1  4. Requires a booster tetanus - has recent cut so will update tdap today  5. PCOS (polycystic ovarian syndrome)

## 2021-10-27 ENCOUNTER — Telehealth: Payer: Self-pay | Admitting: Family Medicine

## 2021-10-27 NOTE — Telephone Encounter (Signed)
Patient came in office and stated that since Dr. Einar Pheasant is leaving she would like to see a Doctor instead of a Designer, jewellery. Call back number [phone].

## 2021-10-27 NOTE — Telephone Encounter (Signed)
I talked to [name] and she said that Dr Silvio Pate is the provider that would be able to see patient as a TOC. I tried to call to schedule patient for after Dr Einar Pheasant leaves and had to leave a voicemail.

## 2021-12-20 ENCOUNTER — Encounter: Payer: Self-pay | Admitting: Internal Medicine

## 2021-12-20 ENCOUNTER — Ambulatory Visit: Payer: Medicare Other | Admitting: Internal Medicine

## 2021-12-20 DIAGNOSIS — I10 Essential (primary) hypertension: Secondary | ICD-10-CM | POA: Diagnosis not present

## 2021-12-20 DIAGNOSIS — K219 Gastro-esophageal reflux disease without esophagitis: Secondary | ICD-10-CM

## 2021-12-20 DIAGNOSIS — Z780 Asymptomatic menopausal state: Secondary | ICD-10-CM

## 2021-12-20 DIAGNOSIS — E782 Mixed hyperlipidemia: Secondary | ICD-10-CM

## 2021-12-20 MED ORDER — SPIRONOLACTONE 100 MG PO TABS: 200.0000 mg | ORAL_TABLET | Freq: Two times a day (BID) | ORAL | 0 refills | Status: AC

## 2021-12-20 MED ORDER — ROSUVASTATIN CALCIUM 40 MG PO TABS: ORAL_TABLET | ORAL | 0 refills | Status: DC

## 2021-12-20 MED ORDER — ESTRADIOL 0.1 MG/GM VA CREA: TOPICAL_CREAM | VAGINAL | 0 refills | Status: AC

## 2021-12-20 NOTE — Progress Notes (Signed)
Subjective:    Patient ID: Christine [name], female    DOB: Mar 08, 1950, 71 y.o.   MRN: [id number]  HPI Here for transfer of care visit  No new issues Concerned about respiratory infections--but prefers no vaccines Takes zinc(just restarting)/vitamin C/NAC  Known multinodular goiter No abnormal thyroid function  Told she was prediabetic--relates to the thyroid she thinks Never over 108 though  Arthritic pain in neck Glucosamine chondroitin had helped--but she stopped (concerned about cholesterol numbers)  Rare migraines now Maxalt still effective  Past GERD---no longer an issue (rare spells---uses tums) Did have past esophageal dilation-- after food impaction (3 years ago or so)  Current Outpatient Medications on File Prior to Visit  Medication Sig Dispense Refill   amLODipine (NORVASC) 2.5 MG tablet Take  1 tablet  Daily for BP                                          /                                  TAKE                                BY                          MOUTH 90 tablet 3   aspirin EC 81 MG tablet Take 81 mg by mouth daily.     B Complex Vitamins (VITAMIN B COMPLEX PO) Take by mouth.     Cholecalciferol (VITAMIN D PO) Take 10,000 Units by mouth daily.      COD LIVER OIL PO Take by mouth daily.     Coenzyme Q10 100 MG TABS Take by mouth daily.     diphenhydrAMINE (BENADRYL) 25 MG tablet Take 25 mg by mouth as needed.      estradiol (ESTRACE) 0.1 MG/GM vaginal cream 1 gram pv twice weekly (Patient taking differently: 1 gm topically weekly as needed) 42.5 g 3   Estradiol 10 MCG TABS vaginal tablet PLACE 1 INSERT VAGINALLY TWICE WEEKLY 24 tablet 4   fexofenadine (ALLEGRA) 180 MG tablet Take 180 mg by mouth daily. Alternating with Zyrtec     MAGNESIUM PO Take 800 Units by mouth daily.      MELATONIN PO Take by mouth as needed. Reported on 08/13/2015     Multiple Vitamins-Minerals (HAIR/SKIN/NAILS/BIOTIN) TABS Take 3 tablets by mouth daily.     Omega-3 Fatty Acids (FISH  OIL PO) Take 1,200 mg by mouth.      Probiotic Product (PROBIOTIC DAILY PO) Take by mouth daily.     rizatriptan (MAXALT) 10 MG tablet Take 1 tablet (10 mg total) by mouth once as needed for migraine. May repeat in 2 hours if needed 30 tablet 2   rosuvastatin (CRESTOR) 40 MG tablet TAKE 1 TABLET BY MOUTH  DAILY FOR CHOLESTEROL (Patient taking differently: 1/2 tablet Mon,  Wed, Fri) 90 tablet 3   spironolactone (ALDACTONE) 100 MG tablet TAKE 2 TABLETS BY MOUTH  DAILY 180 tablet 1   valACYclovir (VALTREX) 1000 MG tablet With onset of symptoms take 2 tablets and repeat in 12 hours. 90 tablet 1   zinc gluconate 50 MG tablet Take 50  mg by mouth daily.     No current facility-administered medications on file prior to visit.    Allergies  Allergen Reactions   Bactrim [Sulfamethoxazole-Trimethoprim] Itching   Dexamethasone Other (See Comments)    Increased intraocular pressure   Lipitor [Atorvastatin] Other (See Comments)    headache   Monistat [Miconazole] Itching    burning    Past Medical History:  Diagnosis Date   AC (acromioclavicular) joint bone spurs    Allergy    Anemia    Anxiety    Degenerative arthritis    GERD (gastroesophageal reflux disease)    Goiter    multi nodular    History of anemia    History of PCOS    Hyperlipidemia    Hypertension    Nevus of left eye    Nuclear cataract 06/13/2011   PCOS (polycystic ovarian syndrome)    Status post intraocular lens implant 06/13/2011    Past Surgical History:  Procedure Laterality Date   24 HOUR [place] STUDY  04/16/2019   Procedure: 24 HOUR [place] STUDY;  Surgeon: Mauri Pole, MD;  Location: [facility];  Service: Endoscopy;;   BUNIONECTOMY Bilateral 2006   Feet   CATARACT EXTRACTION Left    COLONOSCOPY     ESOPHAGEAL MANOMETRY N/A 04/16/2019   Procedure: ESOPHAGEAL MANOMETRY (EM) with 24 hour PH Impedence study;  Surgeon: Mauri Pole, MD;  Location: [facility];  Service: Endoscopy;  Laterality: N/A;    HERNIA REPAIR Right 1959   [place] IMPEDANCE STUDY N/A 04/16/2019   Procedure: [place] IMPEDANCE STUDY;  Surgeon: Mauri Pole, MD;  Location: [facility];  Service: Endoscopy;  Laterality: N/A;   TOE SURGERY Left 2006   Bone Spur   TONSILLECTOMY AND ADENOIDECTOMY  1959   TUBAL LIGATION  1995    Family History  Problem Relation Age of Onset   Thyroid disease Mother        hyperthyroidism   Hypertension Mother    Hyperlipidemia Mother    Heart disease Mother 63   Atrial fibrillation Mother    Congestive Heart Failure Mother    Heart disease Father    Lung cancer Father    Liver cancer Father    Cancer Brother 2       sarcoma   Hyperlipidemia Brother    Heart attack Brother 59   Thyroid disease Daughter    Other Daughter        gluten sensetivity   Polycystic ovary syndrome Daughter    Emphysema Maternal Grandfather    Stroke Paternal Grandmother    Dementia Paternal Grandmother    Colon cancer Neg Hx    Esophageal cancer Neg Hx    Rectal cancer Neg Hx    Stomach cancer Neg Hx     Social History   Socioeconomic History   Marital status: Married    Spouse name: John   Number of children: 3   Years of education: some college   Highest education level: Not on file  Occupational History   Occupation: retired  Tobacco Use   Smoking status: Never    Passive exposure: Never   Smokeless tobacco: Never  Vaping Use   Vaping Use: Never used  Substance and Sexual Activity   Alcohol use: Yes    Alcohol/week: 0.0 - 1.0 standard drinks of alcohol    Comment: once a month   Drug use: No   Sexual activity: Yes    Partners: Male    Birth control/protection: Surgical, Post-menopausal  Comment: BTL  Other Topics Concern   Not on file  Social History Narrative   05/06/21   From: [facility], parents were Army - moved to [place]: with husband, Jenny Reichmann [phone])   Work: retired - Airline pilot       Family: 3 children - [name], Silas [place] - 5 granddaughters -  [place], [place]      Enjoys: singing, sewing, reading      Exercise: ymca - several times a week, and walking the german shepard   Diet: generally diet      Safety   Seat belts: Yes    Guns: Yes  and secure   Safe in relationships: Yes       Social Determinants of Health   Financial Resource Strain: Low Risk  (09/22/2021)   Overall Financial Resource Strain (CARDIA)    Difficulty of Paying Living Expenses: Not hard at all  Food Insecurity: No Food Insecurity (09/22/2021)   Hunger Vital Sign    Worried About Running Out of Food in the Last Year: Never true    [place] in the Last Year: Never true  Transportation Needs: No Transportation Needs (09/22/2021)   PRAPARE - Hydrologist (Medical): No    Lack of Transportation (Non-Medical): No  Physical Activity: Sufficiently Active (09/22/2021)   Exercise Vital Sign    Days of Exercise per Week: 5 days    Minutes of Exercise per Session: 40 min  Stress: No Stress Concern Present (09/22/2021)   [place]    Feeling of Stress : Not at all  Social Connections: [place] (09/22/2021)   Social Connection and Isolation Panel [NHANES]    Frequency of Communication with Friends and Family: More than three times a week    Frequency of Social Gatherings with Friends and Family: More than three times a week    Attends Religious Services: More than 4 times per year    Active Member of [employer] or Organizations: Yes    Attends Music therapist: More than 4 times per year    Marital Status: Married  Human resources officer Violence: Not At Risk (09/22/2021)   Humiliation, Afraid, Rape, and Kick questionnaire    Fear of Current or Ex-Partner: No    Emotionally Abused: No    Physically Abused: No    Sexually Abused: No   Review of Systems     Objective:   Physical Exam Constitutional:      Appearance: Normal  appearance.  Cardiovascular:     Rate and Rhythm: Normal rate and regular rhythm.     Heart sounds: No murmur heard.    No gallop.  Pulmonary:     Effort: Pulmonary effort is normal.     Breath sounds: Normal breath sounds. No wheezing or rales.  Abdominal:     Palpations: Abdomen is soft.     Tenderness: There is no abdominal tenderness.  Musculoskeletal:     Cervical back: Neck supple.     Right lower leg: No edema.     Left lower leg: No edema.  Lymphadenopathy:     Cervical: No cervical adenopathy.  Neurological:     Mental Status: She is alert.  Psychiatric:        Mood and Affect: Mood normal.        Behavior: Behavior normal.  Assessment & Plan:

## 2021-12-20 NOTE — Assessment & Plan Note (Signed)
Past food impaction but minimal symptoms now No Rx for now

## 2021-12-20 NOTE — Assessment & Plan Note (Signed)
BP Readings from Last 3 Encounters:  12/20/21 126/70  10/26/21 136/73  05/06/21 120/80   Fine on amlodipine 2.'5mg'$  daily Spironolactone 100 bid for PCOS

## 2021-12-20 NOTE — Assessment & Plan Note (Signed)
Will change Rx to '20mg'$  when runs out (takes 3 days per week) Reduce asa to every other day

## 2022-01-30 ENCOUNTER — Encounter: Payer: Self-pay | Admitting: Internal Medicine

## 2022-01-30 ENCOUNTER — Telehealth: Payer: Self-pay

## 2022-01-30 ENCOUNTER — Ambulatory Visit: Payer: Medicare Other | Admitting: Internal Medicine

## 2022-01-30 VITALS — BP 138/68 | HR 101 | Temp 97.9°F | Ht 66.0 in | Wt 152.0 lb

## 2022-01-30 DIAGNOSIS — H1031 Unspecified acute conjunctivitis, right eye: Secondary | ICD-10-CM | POA: Insufficient documentation

## 2022-01-30 MED ORDER — MOXIFLOXACIN HCL 0.5 % OP SOLN: 1.0000 [drp] | Freq: Three times a day (TID) | OPHTHALMIC | 1 refills | Status: DC

## 2022-01-30 NOTE — Telephone Encounter (Signed)
Per appt notes pt already has appt with Dr Silvio Pate on 01/30/22 at 12:15 for pink eye. Sending note to Dr Silvio Pate and Silvio Pate pool.      [place] Night - Client TELEPHONE ADVICE RECORD [facility] Patient Name: Christine [name] [facility] Gender: Female DOB: 1950-11-13 Age: 71 Y 1 M 23 D Return Phone Number: [phone] (Primary) Address: City/ State/ Zip: [place] [address] Client [facility] [place] Night - Client Client Site [place] Provider Viviana Simpler- MD Contact Type Call Who Is Calling Patient / Member / Family / Caregiver Call Type Triage / Clinical Relationship To Patient Self Return Phone Number [phone] (Primary) Chief Complaint Fever (non-urgent symptom) (greater than THREE MONTHS old) Reason for Call Symptomatic / Request for [place] states she is wanting to speak with a nurse. She has 102.9 fever, congestion in chest and head. SHe has cough with alot of mucus. She has an old covid test that was inconclusive. She has a blood pressure cuff if needed. Translation No Nurse Assessment Nurse: Selena Batten, RN, [name] Date/Time Eilene Ghazi Time): 01/27/2022 8:29:26 AM Confirm and document reason for call. If symptomatic, describe symptoms. ---Caller states she has had a cough, chest and head congestion that started on Tuesday. Caller has a temperature of 100.8. Does the patient have any new or worsening symptoms? ---Yes Will a triage be completed? ---Yes Related visit to physician within the last 2 weeks? ---No Does the PT have any chronic conditions? (i.e. diabetes, asthma, this includes High risk factors for pregnancy, etc.) ---Yes List chronic conditions. ---hypertension Is this a behavioral health or substance abuse call? ---No Guidelines Guideline Title Affirmed Question Affirmed Notes Nurse Date/Time (Eastern Time) Influenza (Flu) - Seasonal [1]  Difficulty breathing AND [2] not severe AND [3] not from stuffy nose (e.g., not relieved by cleaning out the nose) Selena Batten, RN, [name] 01/27/2022 8:34:23 AM PLEASE NOTE: All timestamps contained within this report are represented as [place] Standard Time. CONFIDENTIALTY NOTICE: This fax transmission is intended only for the addressee. It contains information that is legally privileged, confidential or otherwise protected from use or disclosure. If you are not the intended recipient, you are strictly prohibited from reviewing, disclosing, copying using or disseminating any of this information or taking any action in reliance on or regarding this information. If you have received this fax in error, please notify us immediately by telephone so that we can arrange for its return to [place]. Phone: [phone], Toll-Free: [phone], Fax: [phone] Page: 2 of 2 Call Id: [id number] [place]. Time Eilene Ghazi Time) Disposition Final User 01/27/2022 8:43:20 AM Go to ED Now (or PCP triage) Cato Mulligan, RN, [name] Final Disposition 01/27/2022 8:43:20 AM Go to ED Now (or PCP triage) Yes Selena Batten, RN, [name] Caller Disagree/Comply Comply Caller Understands Yes PreDisposition Call Doctor Care Advice Given Per Guideline GO TO ED NOW (OR PCP TRIAGE): * IF NO PCP (PRIMARY CARE PROVIDER) SECOND-LEVEL TRIAGE: You need to be seen within the next hour. Go to the [facility] at _____________ [place] as soon as you can. ANOTHER ADULT SHOULD DRIVE: * It is better and safer if another adult drives instead of you. CARE ADVICE given per Influenza - Seasonal (Adult) guideline. Comments User: Clabe Seal, RN Date/Time Eilene Ghazi Time): 01/27/2022 8:39:00 AM pulse ox at home has been reading 92-97 over the last few days User: Clabe Seal, RN Date/Time Eilene Ghazi Time): 01/27/2022 8:43:19 AM Caller may try to call provider, understands the need to  get seen within the next hour, informed her this will likely have to be in the form of an UC  or ED Referrals GO TO FACILITY UNDECIDE

## 2022-01-30 NOTE — Assessment & Plan Note (Signed)
Not clear if this is related to the sinus infection--that is better with augmentin and the eye started since then Will give moxifloxacin 1 drop each eye tid

## 2022-01-30 NOTE — Progress Notes (Signed)
Subjective:    Patient ID: Christine [name], female    DOB: 1950/11/10, 71 y.o.   MRN: [id number]  HPI Here due to concerns about pink eye  Started with sinus infection about 2 weeks ago--while in [place] Tried sinus washes but worsened by the time she got home (1 week ago) Quickly got worse with head and chest congestion Fever So went to urgent care 3 days ago--diagnosed with sinusitis Given augmentin and tessalon Started feeling better within 2 days  Started with eye irritation -on right yesterday Then noticed it was blood shot Some itching [place] discharge  Has jury duty tomorrow---didn't know how this would influence  Current Outpatient Medications on File Prior to Visit  Medication Sig Dispense Refill   amLODipine (NORVASC) 2.5 MG tablet Take  1 tablet  Daily for BP                                          /                                  TAKE                                BY                          MOUTH 90 tablet 3   amoxicillin-clavulanate (AUGMENTIN) 875-125 MG tablet Take 1 tablet by mouth 2 (two) times daily.     aspirin EC 81 MG tablet Take 81 mg by mouth every other day.     B Complex Vitamins (VITAMIN B COMPLEX PO) Take by mouth.     benzonatate (TESSALON) 200 MG capsule Take 200 mg by mouth 3 (three) times daily as needed.     Cholecalciferol (VITAMIN D PO) Take 10,000 Units by mouth daily.      COD LIVER OIL PO Take by mouth daily.     Coenzyme Q10 100 MG TABS Take by mouth daily.     estradiol (ESTRACE) 0.1 MG/GM vaginal cream 1 gm topically weekly as needed 1 g 0   Estradiol 10 MCG TABS vaginal tablet PLACE 1 INSERT VAGINALLY TWICE WEEKLY 24 tablet 4   fexofenadine (ALLEGRA) 180 MG tablet Take 180 mg by mouth daily. Alternating with Zyrtec     MAGNESIUM PO Take 800 Units by mouth daily.      MELATONIN PO Take by mouth as needed. Reported on 08/13/2015     Multiple Vitamins-Minerals (HAIR/SKIN/NAILS/BIOTIN) TABS Take 3 tablets by mouth daily.     Omega-3  Fatty Acids (FISH OIL PO) Take 1,200 mg by mouth.      Probiotic Product (PROBIOTIC DAILY PO) Take by mouth daily.     rizatriptan (MAXALT) 10 MG tablet Take 1 tablet (10 mg total) by mouth once as needed for migraine. May repeat in 2 hours if needed 30 tablet 2   rosuvastatin (CRESTOR) 40 MG tablet 1/2 tablet Mon,  Wed, Fri 1 tablet 0   spironolactone (ALDACTONE) 100 MG tablet Take 2 tablets (200 mg total) by mouth 2 (two) times daily. 1 tablet 0   valACYclovir (VALTREX) 1000 MG tablet With onset of symptoms take 2 tablets and repeat in 12 hours. 90 tablet  1   zinc gluconate 50 MG tablet Take 50 mg by mouth daily.     No current facility-administered medications on file prior to visit.    Allergies  Allergen Reactions   Bactrim [Sulfamethoxazole-Trimethoprim] Itching   Dexamethasone Other (See Comments)    Increased intraocular pressure   Lipitor [Atorvastatin] Other (See Comments)    headache   Monistat [Miconazole] Itching    burning    Past Medical History:  Diagnosis Date   AC (acromioclavicular) joint bone spurs    Allergy    Anemia    Anxiety    Degenerative arthritis    GERD (gastroesophageal reflux disease)    Goiter    multi nodular    History of anemia    History of PCOS    Hyperlipidemia    Hypertension    Nevus of left eye    Nuclear cataract 06/13/2011   PCOS (polycystic ovarian syndrome)    Status post intraocular lens implant 06/13/2011    Past Surgical History:  Procedure Laterality Date   24 HOUR [place] STUDY  04/16/2019   Procedure: 24 HOUR [place] STUDY;  Surgeon: Mauri Pole, MD;  [facility];  Service: Endoscopy;;   BUNIONECTOMY Bilateral 2006   Feet   CATARACT EXTRACTION Left    COLONOSCOPY     ESOPHAGEAL MANOMETRY N/A 04/16/2019   Procedure: ESOPHAGEAL MANOMETRY (EM) with 24 hour PH Impedence study;  Surgeon: Mauri Pole, MD;  [facility]: [facility];  Service: Endoscopy;  Laterality: N/A;   HERNIA REPAIR Right 1959   [place]  IMPEDANCE STUDY N/A 04/16/2019   Procedure: [place] IMPEDANCE STUDY;  Surgeon: Mauri Pole, MD;  [facility];  Service: Endoscopy;  Laterality: N/A;   TOE SURGERY Left 2006   Bone Spur   TONSILLECTOMY AND ADENOIDECTOMY  1959   TUBAL LIGATION  1995    Family History  Problem Relation Age of Onset   Thyroid disease Mother        hyperthyroidism   Hypertension Mother    Hyperlipidemia Mother    Heart disease Mother 81   Atrial fibrillation Mother    Congestive Heart Failure Mother    Heart disease Father    Lung cancer Father    Liver cancer Father    Cancer Brother 90       sarcoma   Hyperlipidemia Brother    Heart attack Brother 54   Thyroid disease Daughter    Other Daughter        gluten sensetivity   Polycystic ovary syndrome Daughter    Emphysema Maternal Grandfather    Stroke Paternal Grandmother    Dementia Paternal Grandmother    Colon cancer Neg Hx    Esophageal cancer Neg Hx    Rectal cancer Neg Hx    Stomach cancer Neg Hx     Social History   Socioeconomic History   Marital status: Married    Spouse name: John   Number of children: 3   Years of education: some college   Highest education level: Not on file  Occupational History   Occupation: Dentist    Comment: Retired  Tobacco Use   Smoking status: Never    Passive exposure: Never   Smokeless tobacco: Never  Vaping Use   Vaping Use: Never used  Substance and Sexual Activity   Alcohol use: Yes    Alcohol/week: 0.0 - 1.0 standard drinks of alcohol    Comment: once a month   Drug use: No  Sexual activity: Yes    Partners: Male    Birth control/protection: Surgical, Post-menopausal    Comment: BTL  Other Topics Concern   Not on file  Social History Narrative   05/06/21   From: [facility], parents were Army - moved to [place]: with husband, Jenny Reichmann [phone])   Work: retired - Airline pilot       Family: 3 children - [name], Silas [place] - 5 granddaughters -  [place], [place]      Enjoys: singing, sewing, reading      Exercise: ymca - several times a week, and walking the german shepard   Diet: generally diet      Safety   Seat belts: Yes    Guns: Yes  and secure   Safe in relationships: Yes       No living will   Husband is health care POA---children are alternates   Would accept resuscitation attempts   Social Determinants of Health   Financial Resource Strain: Low Risk  (09/22/2021)   Overall Financial Resource Strain (CARDIA)    Difficulty of Paying Living Expenses: Not hard at all  Food Insecurity: No Food Insecurity (09/22/2021)   Hunger Vital Sign    Worried About Running Out of Food in the Last Year: Never true    [place] in the Last Year: Never true  Transportation Needs: No Transportation Needs (09/22/2021)   PRAPARE - Hydrologist (Medical): No    Lack of Transportation (Non-Medical): No  Physical Activity: Sufficiently Active (09/22/2021)   Exercise Vital Sign    Days of Exercise per Week: 5 days    Minutes of Exercise per Session: 40 min  Stress: No Stress Concern Present (09/22/2021)   [place]    Feeling of Stress : Not at all  Social Connections: [place] (09/22/2021)   Social Connection and Isolation Panel [NHANES]    Frequency of Communication with Friends and Family: More than three times a week    Frequency of Social Gatherings with Friends and Family: More than three times a week    Attends Religious Services: More than 4 times per year    Active Member of [employer] or Organizations: Yes    Attends Music therapist: More than 4 times per year    Marital Status: Married  Human resources officer Violence: Not At Risk (09/22/2021)   Humiliation, Afraid, Rape, and Kick questionnaire    Fear of Current or Ex-Partner: No    Emotionally Abused: No    Physically Abused: No    Sexually  Abused: No   Review of Systems No N/V/diarrhea Not back to normal eating yet Vision somewhat blurry--due to "film"    Objective:   Physical Exam Constitutional:      Appearance: Normal appearance.  Eyes:     Comments: Conjunctival injection on right Some crusting Left is normal  Neurological:     Mental Status: She is alert.            Assessment & Plan:

## 2022-01-30 NOTE — Telephone Encounter (Signed)
I will assess all her symptoms at the visit today

## 2022-03-14 ENCOUNTER — Encounter: Payer: Medicare Other | Admitting: Nurse Practitioner

## 2022-03-16 ENCOUNTER — Ambulatory Visit (INDEPENDENT_AMBULATORY_CARE_PROVIDER_SITE_OTHER): Payer: Medicare Other | Admitting: Internal Medicine

## 2022-03-16 ENCOUNTER — Encounter: Payer: Self-pay | Admitting: Internal Medicine

## 2022-03-16 ENCOUNTER — Encounter: Payer: Medicare Other | Admitting: Family Medicine

## 2022-03-16 VITALS — BP 134/84 | HR 63 | Temp 97.7°F | Ht 66.0 in | Wt 147.0 lb

## 2022-03-16 DIAGNOSIS — Z0000 Encounter for general adult medical examination without abnormal findings: Secondary | ICD-10-CM | POA: Insufficient documentation

## 2022-03-16 DIAGNOSIS — I10 Essential (primary) hypertension: Secondary | ICD-10-CM

## 2022-03-16 DIAGNOSIS — N952 Postmenopausal atrophic vaginitis: Secondary | ICD-10-CM | POA: Diagnosis not present

## 2022-03-16 DIAGNOSIS — E782 Mixed hyperlipidemia: Secondary | ICD-10-CM

## 2022-03-16 LAB — COMPREHENSIVE METABOLIC PANEL
ALT: 26 U/L (ref 0–35)
AST: 23 U/L (ref 0–37)
Albumin: 4.9 g/dL (ref 3.5–5.2)
Alkaline Phosphatase: 118 U/L — ABNORMAL HIGH (ref 39–117)
BUN: 16 mg/dL (ref 6–23)
CO2: 29 mEq/L (ref 19–32)
Calcium: 9.7 mg/dL (ref 8.4–10.5)
Chloride: 101 mEq/L (ref 96–112)
Creatinine, Ser: 0.91 mg/dL (ref 0.40–1.20)
GFR: 64.02 mL/min (ref >60.00)
Glucose, Bld: 103 mg/dL — ABNORMAL HIGH (ref 70–99)
Potassium: 4.1 mEq/L (ref 3.5–5.1)
Sodium: 140 mEq/L (ref 135–145)
Total Bilirubin: 0.7 mg/dL (ref 0.2–1.2)
Total Protein: 7.2 g/dL (ref 6.0–8.3)

## 2022-03-16 LAB — CBC
HCT: 43.6 % (ref 36.0–46.0)
Hemoglobin: 14.4 g/dL (ref 12.0–15.0)
MCHC: 33 g/dL (ref 30.0–36.0)
MCV: 91.4 fl (ref 78.0–100.0)
Platelets: 324 10*3/uL (ref 150.0–400.0)
RBC: 4.78 Mil/uL (ref 3.87–5.11)
RDW: 13.2 % (ref 11.5–15.5)
WBC: 5.7 10*3/uL (ref 4.0–10.5)

## 2022-03-16 LAB — LIPID PANEL
Cholesterol: 227 mg/dL — ABNORMAL HIGH (ref 0–200)
HDL: 71.8 mg/dL (ref >39.00)
LDL Cholesterol: 132 mg/dL — ABNORMAL HIGH (ref 0–99)
NonHDL: 155.48
Total CHOL/HDL Ratio: 3
Triglycerides: 115 mg/dL (ref 0.0–149.0)
VLDL: 23 mg/dL (ref 0.0–40.0)

## 2022-03-16 NOTE — Assessment & Plan Note (Signed)
Does well with the twice weekly vaginal tablets Rarely uses the cream

## 2022-03-16 NOTE — Progress Notes (Signed)
Subjective:    Patient ID: Christine [name], female    DOB: 05/30/1950, 71 y.o.   MRN: [id number]  HPI Here for physical  Only new issue is wondering about calcified nodule seen on CXR in [facility] (when seen for bronchitis in November) Thinks she had this many years ago No SOB---just has post nasal drip and slight residual cough  Stopped the amlodpine some months ago---BP running very low As low as 06/26---RSWNIOE under 703 systolic Still on spironolactone for PCOS  Stopped the statin Went off when she was sick---was only 3 days per week Some FH---brother with MI at 63  Current Outpatient Medications on File Prior to Visit  Medication Sig Dispense Refill   aspirin EC 81 MG tablet Take 81 mg by mouth every other day.     B Complex Vitamins (VITAMIN B COMPLEX PO) Take by mouth.     Cholecalciferol (VITAMIN D PO) Take 10,000 Units by mouth daily.      COD LIVER OIL PO Take by mouth daily.     Coenzyme Q10 100 MG TABS Take by mouth daily.     estradiol (ESTRACE) 0.1 MG/GM vaginal cream 1 gm topically weekly as needed 1 g 0   Estradiol 10 MCG TABS vaginal tablet PLACE 1 INSERT VAGINALLY TWICE WEEKLY 24 tablet 4   fexofenadine (ALLEGRA) 180 MG tablet Take 180 mg by mouth daily. Alternating with Zyrtec     MAGNESIUM PO Take 800 Units by mouth daily.      MELATONIN PO Take by mouth as needed. Reported on 08/13/2015     Multiple Vitamins-Minerals (HAIR/SKIN/NAILS/BIOTIN) TABS Take 3 tablets by mouth daily.     Omega-3 Fatty Acids (FISH OIL PO) Take 1,200 mg by mouth.      Probiotic Product (PROBIOTIC DAILY PO) Take by mouth daily.     rizatriptan (MAXALT) 10 MG tablet Take 1 tablet (10 mg total) by mouth once as needed for migraine. May repeat in 2 hours if needed 30 tablet 2   spironolactone (ALDACTONE) 100 MG tablet Take 2 tablets (200 mg total) by mouth 2 (two) times daily. 1 tablet 0   valACYclovir (VALTREX) 1000 MG tablet With onset of symptoms take 2 tablets and repeat in 12 hours. 90  tablet 1   No current facility-administered medications on file prior to visit.    Allergies  Allergen Reactions   Bactrim [Sulfamethoxazole-Trimethoprim] Itching   Dexamethasone Other (See Comments)    Increased intraocular pressure   Lipitor [Atorvastatin] Other (See Comments)    headache   Monistat [Miconazole] Itching    burning    Past Medical History:  Diagnosis Date   AC (acromioclavicular) joint bone spurs    Allergy    Anemia    Anxiety    Degenerative arthritis    GERD (gastroesophageal reflux disease)    Goiter    multi nodular    History of anemia    History of PCOS    Hyperlipidemia    Hypertension    Nevus of left eye    Nuclear cataract 06/13/2011   PCOS (polycystic ovarian syndrome)    Status post intraocular lens implant 06/13/2011    Past Surgical History:  Procedure Laterality Date   24 HOUR [place] STUDY  04/16/2019   Procedure: 24 HOUR [place] STUDY;  Surgeon: Mauri Pole, MD;  Location: [facility];  Service: Endoscopy;;   BUNIONECTOMY Bilateral 2006   Feet   CATARACT EXTRACTION Left    COLONOSCOPY  ESOPHAGEAL MANOMETRY N/A 04/16/2019   Procedure: ESOPHAGEAL MANOMETRY (EM) with 24 hour PH Impedence study;  Surgeon: Mauri Pole, MD;  Location: [facility];  Service: Endoscopy;  Laterality: N/A;   HERNIA REPAIR Right 1959   [place] IMPEDANCE STUDY N/A 04/16/2019   Procedure: [place] IMPEDANCE STUDY;  Surgeon: Mauri Pole, MD;  Location: [facility];  Service: Endoscopy;  Laterality: N/A;   TOE SURGERY Left 2006   Bone Spur   TONSILLECTOMY AND ADENOIDECTOMY  1959   TUBAL LIGATION  1995    Family History  Problem Relation Age of Onset   Thyroid disease Mother        hyperthyroidism   Hypertension Mother    Hyperlipidemia Mother    Heart disease Mother 82   Atrial fibrillation Mother    Congestive Heart Failure Mother    Heart disease Father    Lung cancer Father    Liver cancer Father    Cancer Brother 71       sarcoma    Hyperlipidemia Brother    Heart attack Brother 26   Heart disease Brother    Thyroid disease Daughter    Other Daughter        gluten sensetivity   Polycystic ovary syndrome Daughter    Emphysema Maternal Grandfather    Stroke Paternal Grandmother    Dementia Paternal Grandmother    Colon cancer Neg Hx    Esophageal cancer Neg Hx    Rectal cancer Neg Hx    Stomach cancer Neg Hx     Social History   Socioeconomic History   Marital status: Married    Spouse name: John   Number of children: 3   Years of education: some college   Highest education level: Not on file  Occupational History   Occupation: Dentist    Comment: Retired  Tobacco Use   Smoking status: Never    Passive exposure: Never   Smokeless tobacco: Never  Vaping Use   Vaping Use: Never used  Substance and Sexual Activity   Alcohol use: Yes    Alcohol/week: 0.0 - 1.0 standard drinks of alcohol    Comment: once a month   Drug use: No   Sexual activity: Yes    Partners: Male    Birth control/protection: Surgical, Post-menopausal    Comment: BTL  Other Topics Concern   Not on file  Social History Narrative   05/06/21   From: [facility] [place], parents were [employer] - moved to [place]: with husband, Jenny Reichmann [phone])   Work: retired - Airline pilot       Family: 3 children - [name], [place] - 5 granddaughters - [place], [place]      Enjoys: singing, sewing, reading      Exercise: ymca - several times a week, and walking the german shepard   Diet: generally diet      Safety   Seat belts: Yes    Guns: Yes  and secure   Safe in relationships: Yes       No living will   Husband is health care POA---children are alternates   Would accept resuscitation attempts   Social Determinants of Health   Financial Resource Strain: Low Risk  (09/22/2021)   Overall Financial Resource Strain (CARDIA)    Difficulty of Paying Living Expenses: Not hard at all  Food Insecurity: No Food  Insecurity (09/22/2021)   Hunger Vital Sign    Worried About Running Out of  Food in the Last Year: Never true    [place] in the Last Year: Never true  Transportation Needs: No Transportation Needs (09/22/2021)   PRAPARE - Hydrologist (Medical): No    Lack of Transportation (Non-Medical): No  Physical Activity: Sufficiently Active (09/22/2021)   Exercise Vital Sign    Days of Exercise per Week: 5 days    Minutes of Exercise per Session: 40 min  Stress: No Stress Concern Present (09/22/2021)   [place]    Feeling of Stress : Not at all  Social Connections: [place] (09/22/2021)   Social Connection and Isolation Panel [NHANES]    Frequency of Communication with Friends and Family: More than three times a week    Frequency of Social Gatherings with Friends and Family: More than three times a week    Attends Religious Services: More than 4 times per year    Active Member of [employer] or Organizations: Yes    Attends Archivist Meetings: More than 4 times per year    Marital Status: Married  Human resources officer Violence: Not At Risk (09/22/2021)   Humiliation, Afraid, Rape, and Kick questionnaire    Fear of Current or Ex-Partner: No    Emotionally Abused: No    Physically Abused: No    Sexually Abused: No   Review of Systems  Constitutional:  Negative for fatigue.       Weight is up slightly Wears seat belt  HENT:  Negative for hearing loss and tinnitus.        Recent root canal and crown  Eyes:  Negative for visual disturbance.       Uses prism for diplopia  Respiratory:  Negative for chest tightness and shortness of breath.   Cardiovascular:  Negative for chest pain.       Occ palpitations with caffeine Evening leg swelling--occ pain (wears support hose)  Gastrointestinal:  Negative for blood in stool and constipation.       No heartburn lately  Endocrine: Negative  for polydipsia and polyuria.  Genitourinary:  Negative for dyspareunia, dysuria and frequency.       Still uses the tablets  Musculoskeletal:  Negative for joint swelling.       Some scattered pain---glucosamine/chondroitin  Skin:  Negative for rash.  Allergic/Immunologic: Positive for environmental allergies. Negative for immunocompromised state.       Uses seasonal meds  Neurological:  Negative for dizziness, syncope and light-headedness.       Still rare migraines--maxalt helps Occ stress headaches  Hematological:  Does not bruise/bleed easily.       Nodes only when ill Feels her thyroid  Psychiatric/Behavioral:  Negative for dysphoric mood. The patient is not nervous/anxious.        Somewhat restless sleep---occ daytime tired feelings (not an issue in retirement)        Objective:   Physical Exam Constitutional:      Appearance: Normal appearance.  HENT:     Mouth/Throat:     Pharynx: No oropharyngeal exudate or posterior oropharyngeal erythema.  Eyes:     Conjunctiva/sclera: Conjunctivae normal.     Pupils: Pupils are equal, round, and reactive to light.  Cardiovascular:     Rate and Rhythm: Normal rate and regular rhythm.     Pulses: Normal pulses.     Heart sounds: No murmur heard.    No gallop.  Pulmonary:     Effort:  Pulmonary effort is normal.     Breath sounds: Normal breath sounds. No wheezing or rales.  Abdominal:     Palpations: Abdomen is soft.     Tenderness: There is no abdominal tenderness.  Musculoskeletal:     Cervical back: Neck supple.     Right lower leg: No edema.     Left lower leg: No edema.  Lymphadenopathy:     Cervical: No cervical adenopathy.  Skin:    Findings: No lesion or rash.  Neurological:     General: No focal deficit present.     Mental Status: She is alert and oriented to person, place, and time.  Psychiatric:        Mood and Affect: Mood normal.        Behavior: Behavior normal.            Assessment & Plan:

## 2022-03-16 NOTE — Assessment & Plan Note (Signed)
Not excited about continuing primary prevention Off the statin now Will recheck labs

## 2022-03-16 NOTE — Assessment & Plan Note (Signed)
Healthy Generally exercising and eats right Prefers no COVID or flu vaccines Colon due 2026 Yearly mammogram till 71 or perhaps 86

## 2022-03-16 NOTE — Assessment & Plan Note (Signed)
BP Readings from Last 3 Encounters:  03/16/22 134/84  01/30/22 138/68  12/20/21 126/70   Still okay off the amlodipine On spironolactone for PCOS---can probably try to wean dose

## 2022-07-11 ENCOUNTER — Encounter: Payer: Self-pay | Admitting: Internal Medicine

## 2022-07-20 ENCOUNTER — Ambulatory Visit: Payer: Medicare Other | Admitting: Internal Medicine

## 2022-07-20 ENCOUNTER — Encounter: Payer: Self-pay | Admitting: Internal Medicine

## 2022-07-20 VITALS — BP 136/78 | HR 87 | Temp 98.7°F | Ht 66.0 in | Wt 149.0 lb

## 2022-07-20 DIAGNOSIS — R21 Rash and other nonspecific skin eruption: Secondary | ICD-10-CM | POA: Diagnosis not present

## 2022-07-20 MED ORDER — DOXYCYCLINE HYCLATE 100 MG PO CAPS: 100.0000 mg | ORAL_CAPSULE | Freq: Two times a day (BID) | ORAL | 0 refills | Status: AC

## 2022-07-20 NOTE — Assessment & Plan Note (Signed)
Around tick bite Not classic ECM but possible--no signs of early dissemination Will go ahead and extend the doxycycline to 2 weeks---100 bid

## 2022-07-20 NOTE — Progress Notes (Signed)
Subjective:    Patient ID: Christine [name], female    DOB: 11-05-1950, 72 y.o.   MRN: [id number]  HPI Here due to ongoing concerns from a tick bite  Concerned she might have Lyme disease Doesn't think 1 week of doxycycline she got is enough  Found tick on leg ~11 days ago Was embedded and they pulled it out Rash developed days later---round redness around bite  No other rash No fever  Current Outpatient Medications on File Prior to Visit  Medication Sig Dispense Refill   aspirin EC 81 MG tablet Take 81 mg by mouth every other day.     B Complex Vitamins (VITAMIN B COMPLEX PO) Take by mouth.     Cholecalciferol (VITAMIN D PO) Take 10,000 Units by mouth daily.      COD LIVER OIL PO Take by mouth daily.     Coenzyme Q10 100 MG TABS Take by mouth daily.     doxycycline (VIBRAMYCIN) 100 MG capsule Take 1 capsule by mouth 2 (two) times daily.     estradiol (ESTRACE) 0.1 MG/GM vaginal cream 1 gm topically weekly as needed 1 g 0   Estradiol 10 MCG TABS vaginal tablet PLACE 1 INSERT VAGINALLY TWICE WEEKLY 24 tablet 4   fexofenadine (ALLEGRA) 180 MG tablet Take 180 mg by mouth daily. Alternating with Zyrtec     MAGNESIUM PO Take 800 Units by mouth daily.      MELATONIN PO Take by mouth as needed. Reported on 08/13/2015     Multiple Vitamins-Minerals (HAIR/SKIN/NAILS/BIOTIN) TABS Take 3 tablets by mouth daily.     Omega-3 Fatty Acids (FISH OIL PO) Take 1,200 mg by mouth.      Probiotic Product (PROBIOTIC DAILY PO) Take by mouth daily.     rizatriptan (MAXALT) 10 MG tablet Take 1 tablet (10 mg total) by mouth once as needed for migraine. May repeat in 2 hours if needed 30 tablet 2   spironolactone (ALDACTONE) 100 MG tablet Take 2 tablets (200 mg total) by mouth 2 (two) times daily. 1 tablet 0   valACYclovir (VALTREX) 1000 MG tablet With onset of symptoms take 2 tablets and repeat in 12 hours. 90 tablet 1   No current facility-administered medications on file prior to visit.    Allergies   Allergen Reactions   Bactrim [Sulfamethoxazole-Trimethoprim] Itching   Dexamethasone Other (See Comments)    Increased intraocular pressure   Lipitor [Atorvastatin] Other (See Comments)    headache   Monistat [Miconazole] Itching    burning    Past Medical History:  Diagnosis Date   AC (acromioclavicular) joint bone spurs    Allergy    Anemia    Anxiety    Degenerative arthritis    GERD (gastroesophageal reflux disease)    Goiter    multi nodular    History of anemia    History of PCOS    Hyperlipidemia    Hypertension    Nevus of left eye    Nuclear cataract 06/13/2011   PCOS (polycystic ovarian syndrome)    Status post intraocular lens implant 06/13/2011    Past Surgical History:  Procedure Laterality Date   84 HOUR PH STUDY  04/16/2019   Procedure: 24 HOUR [place] STUDY;  Surgeon: Napoleon Form, MD;  Location: [facility];  Service: Endoscopy;;   BUNIONECTOMY Bilateral 2006   Feet   CATARACT EXTRACTION Left    COLONOSCOPY     ESOPHAGEAL MANOMETRY N/A 04/16/2019   Procedure: ESOPHAGEAL MANOMETRY (EM) with  24 hour [place] Impedence study;  Surgeon: Napoleon Form, MD;  Location: [facility];  Service: Endoscopy;  Laterality: N/A;   HERNIA REPAIR Right 1959   [place] IMPEDANCE STUDY N/A 04/16/2019   Procedure: PH IMPEDANCE STUDY;  Surgeon: Napoleon Form, MD;  Location: [facility];  Service: Endoscopy;  Laterality: N/A;   TOE SURGERY Left 2006   Bone Spur   TONSILLECTOMY AND ADENOIDECTOMY  1959   TUBAL LIGATION  1995    Family History  Problem Relation Age of Onset   Thyroid disease Mother        hyperthyroidism   Hypertension Mother    Hyperlipidemia Mother    Heart disease Mother 39   Atrial fibrillation Mother    Congestive Heart Failure Mother    Heart disease Father    Lung cancer Father    Liver cancer Father    Cancer Brother 49       sarcoma   Hyperlipidemia Brother    Heart attack Brother 10   Heart disease Brother    Thyroid disease  Daughter    Other Daughter        gluten sensetivity   Polycystic ovary syndrome Daughter    Emphysema Maternal Grandfather    Stroke Paternal Grandmother    Dementia Paternal Grandmother    Colon cancer Neg Hx    Esophageal cancer Neg Hx    Rectal cancer Neg Hx    Stomach cancer Neg Hx     Social History   Socioeconomic History   Marital status: Married    Spouse name: John   Number of children: 3   Years of education: some college   Highest education level: Not on file  Occupational History   Occupation: Advertising copywriter    Comment: Retired  Tobacco Use   Smoking status: Never    Passive exposure: Never   Smokeless tobacco: Never  Vaping Use   Vaping Use: Never used  Substance and Sexual Activity   Alcohol use: Yes    Alcohol/week: 0.0 - 1.0 standard drinks of alcohol    Comment: once a month   Drug use: No   Sexual activity: Yes    Partners: Male    Birth control/protection: Surgical, Post-menopausal    Comment: BTL  Other Topics Concern   Not on file  Social History Narrative   05/06/21   From: [facility] [place], parents were Army - moved to [place] 1974   Living: with husband, Christine [name] [phone])   Work: retired - Therapist, nutritional       Family: 3 children - Christine [name], Christine [name] - 5 granddaughters - [place], [place], and [place]      Enjoys: singing, sewing, reading      Exercise: ymca - several times a week, and walking the german shepard   Diet: generally diet      Safety   Seat belts: Yes    Guns: Yes  and secure   Safe in relationships: Yes       No living will   Husband is health care POA---children are alternates   Would accept resuscitation attempts   Social Determinants of Health   Financial Resource Strain: Low Risk  (09/22/2021)   Overall Financial Resource Strain (CARDIA)    Difficulty of Paying Living Expenses: Not hard at all  Food Insecurity: No Food Insecurity (09/22/2021)   Hunger Vital Sign    Worried About Running Out of Food in the Last Year: Never  true    Ran  Out of Food in the Last Year: Never true  Transportation Needs: No Transportation Needs (09/22/2021)   PRAPARE - Administrator, Civil Service (Medical): No    Lack of Transportation (Non-Medical): No  Physical Activity: Sufficiently Active (09/22/2021)   Exercise Vital Sign    Days of Exercise per Week: 5 days    Minutes of Exercise per Session: 40 min  Stress: No Stress Concern Present (09/22/2021)   [employer] of Occupational Health - Occupational Stress Questionnaire    Feeling of Stress : Not at all  Social Connections: Socially [place] (09/22/2021)   Social Connection and Isolation Panel [NHANES]    Frequency of Communication with Friends and Family: More than three times a week    Frequency of Social Gatherings with Friends and Family: More than three times a week    Attends Religious Services: More than 4 times per year    Active Member of [employer] or Organizations: Yes    Attends Engineer, structural: More than 4 times per year    Marital Status: Married  Catering manager Violence: Not At Risk (09/22/2021)   Humiliation, Afraid, Rape, and Kick questionnaire    Fear of Current or Ex-Partner: No    Emotionally Abused: No    Physically Abused: No    Sexually Abused: No   Review of Systems Feels okay Eating fine---though did have one bout of nausea (?from doxy)     Objective:   Physical Exam Constitutional:      Appearance: Normal appearance.  Skin:    Comments: Circular red maculopapular rash around tick bite  Neurological:     Mental Status: She is alert.            Assessment & Plan:

## 2022-09-26 ENCOUNTER — Ambulatory Visit (INDEPENDENT_AMBULATORY_CARE_PROVIDER_SITE_OTHER): Payer: Medicare Other

## 2022-09-26 VITALS — BP 105/76 | HR 77 | Ht 66.0 in | Wt 146.0 lb

## 2022-09-26 DIAGNOSIS — Z0000 Encounter for general adult medical examination without abnormal findings: Secondary | ICD-10-CM

## 2022-09-26 NOTE — Patient Instructions (Signed)
Christine [name] , Thank you for taking time to come for your Medicare Wellness Visit. I appreciate your ongoing commitment to your health goals. Please review the following plan we discussed and let me know if I can assist you in the future.   These are the goals we discussed:  Goals       Lose 5lbs (pt-stated)      Patient Stated      Would like to get weight down to 140 pounds.        This is a list of the screening recommended for you and due dates:  Health Maintenance  Topic Date Due   Hepatitis C Screening  Never done   Pneumonia Vaccine (3 of 3 - PPSV23 or PCV20) 12/15/2021   Medicare Annual Wellness Visit  09/23/2022   Flu Shot  10/05/2022   Mammogram  09/10/2023   Colon Cancer Screening  02/04/2025   DTaP/Tdap/Td vaccine (3 - Td or Tdap) 10/27/2031   DEXA scan (bone density measurement)  Completed   Zoster (Shingles) Vaccine  Completed   HPV Vaccine  Aged Out   COVID-19 Vaccine  Discontinued    Advanced directives: Please bring a copy of your health care power of attorney and living will to the office to be added to your chart at your convenience.   Conditions/risks identified: none  Next appointment: Follow up in one year for your annual wellness visit not scheduled, Dr.Letvak will do next AWV at time of annual physical.   Preventive Care 65 Years and Older, Female Preventive care refers to lifestyle choices and visits with your health care provider that can promote health and wellness. What does preventive care include? A yearly physical exam. This is also called an annual well check. Dental exams once or twice a year. Routine eye exams. Ask your health care provider how often you should have your eyes checked. Personal lifestyle choices, including: Daily care of your teeth and gums. Regular physical activity. Eating a healthy diet. Avoiding tobacco and drug use. Limiting alcohol use. Practicing safe sex. Taking low-dose aspirin every day. Taking vitamin and  mineral supplements as recommended by your health care provider. What happens during an annual well check? The services and screenings done by your health care provider during your annual well check will depend on your age, overall health, lifestyle risk factors, and family history of disease. Counseling  Your health care provider may ask you questions about your: Alcohol use. Tobacco use. Drug use. Emotional well-being. Home and relationship well-being. Sexual activity. Eating habits. History of falls. Memory and ability to understand (cognition). Work and work Astronomer. Reproductive health. Screening  You may have the following tests or measurements: Height, weight, and BMI. Blood pressure. Lipid and cholesterol levels. These may be checked every 5 years, or more frequently if you are over 69 years old. Skin check. Lung cancer screening. You may have this screening every year starting at age 29 if you have a 30-pack-year history of smoking and currently smoke or have quit within the past 15 years. Fecal occult blood test (FOBT) of the stool. You may have this test every year starting at age 73. Flexible sigmoidoscopy or colonoscopy. You may have a sigmoidoscopy every 5 years or a colonoscopy every 10 years starting at age 9. Hepatitis C blood test. Hepatitis B blood test. Sexually transmitted disease (STD) testing. Diabetes screening. This is done by checking your blood sugar (glucose) after you have not eaten for a while (fasting). You may have  this done every 1-3 years. Bone density scan. This is done to screen for osteoporosis. You may have this done starting at age 67. Mammogram. This may be done every 1-2 years. Talk to your health care provider about how often you should have regular mammograms. Talk with your health care provider about your test results, treatment options, and if necessary, the need for more tests. Vaccines  Your health care provider may recommend  certain vaccines, such as: Influenza vaccine. This is recommended every year. Tetanus, diphtheria, and acellular pertussis (Tdap, Td) vaccine. You may need a Td booster every 10 years. Zoster vaccine. You may need this after age 34. Pneumococcal 13-valent conjugate (PCV13) vaccine. One dose is recommended after age 69. Pneumococcal polysaccharide (PPSV23) vaccine. One dose is recommended after age 64. Talk to your health care provider about which screenings and vaccines you need and how often you need them. This information is not intended to replace advice given to you by your health care provider. Make sure you discuss any questions you have with your health care provider. Document Released: 03/19/2015 Document Revised: 11/10/2015 Document Reviewed: 12/22/2014 Elsevier Interactive Patient Education  2017 [employer].  Fall Prevention in the Home Falls can cause injuries. They can happen to people of all ages. There are many things you can do to make your home safe and to help prevent falls. What can I do on the outside of my home? Regularly fix the edges of walkways and driveways and fix any cracks. Remove anything that might make you trip as you walk through a door, such as a raised step or threshold. Trim any bushes or trees on the path to your home. Use bright outdoor lighting. Clear any walking paths of anything that might make someone trip, such as rocks or tools. Regularly check to see if handrails are loose or broken. Make sure that both sides of any steps have handrails. Any raised decks and porches should have guardrails on the edges. Have any leaves, snow, or ice cleared regularly. Use sand or salt on walking paths during winter. Clean up any spills in your garage right away. This includes oil or grease spills. What can I do in the bathroom? Use night lights. Install grab bars by the toilet and in the tub and shower. Do not use towel bars as grab bars. Use non-skid mats or  decals in the tub or shower. If you need to sit down in the shower, use a plastic, non-slip stool. Keep the floor dry. Clean up any water that spills on the floor as soon as it happens. Remove soap buildup in the tub or shower regularly. Attach bath mats securely with double-sided non-slip rug tape. Do not have throw rugs and other things on the floor that can make you trip. What can I do in the bedroom? Use night lights. Make sure that you have a light by your bed that is easy to reach. Do not use any sheets or blankets that are too big for your bed. They should not hang down onto the floor. Have a firm chair that has side arms. You can use this for support while you get dressed. Do not have throw rugs and other things on the floor that can make you trip. What can I do in the kitchen? Clean up any spills right away. Avoid walking on wet floors. Keep items that you use a lot in easy-to-reach places. If you need to reach something above you, use a strong step stool  that has a grab bar. Keep electrical cords out of the way. Do not use floor polish or wax that makes floors slippery. If you must use wax, use non-skid floor wax. Do not have throw rugs and other things on the floor that can make you trip. What can I do with my stairs? Do not leave any items on the stairs. Make sure that there are handrails on both sides of the stairs and use them. Fix handrails that are broken or loose. Make sure that handrails are as long as the stairways. Check any carpeting to make sure that it is firmly attached to the stairs. Fix any carpet that is loose or worn. Avoid having throw rugs at the top or bottom of the stairs. If you do have throw rugs, attach them to the floor with carpet tape. Make sure that you have a light switch at the top of the stairs and the bottom of the stairs. If you do not have them, ask someone to add them for you. What else can I do to help prevent falls? Wear shoes that: Do not  have high heels. Have rubber bottoms. Are comfortable and fit you well. Are closed at the toe. Do not wear sandals. If you use a stepladder: Make sure that it is fully opened. Do not climb a closed stepladder. Make sure that both sides of the stepladder are locked into place. Ask someone to hold it for you, if possible. Clearly mark and make sure that you can see: Any grab bars or handrails. First and last steps. Where the edge of each step is. Use tools that help you move around (mobility aids) if they are needed. These include: Canes. Walkers. Scooters. Crutches. Turn on the lights when you go into a dark area. Replace any light bulbs as soon as they burn out. Set up your furniture so you have a clear path. Avoid moving your furniture around. If any of your floors are uneven, fix them. If there are any pets around you, be aware of where they are. Review your medicines with your doctor. Some medicines can make you feel dizzy. This can increase your chance of falling. Ask your doctor what other things that you can do to help prevent falls. This information is not intended to replace advice given to you by your health care provider. Make sure you discuss any questions you have with your health care provider. Document Released: 12/17/2008 Document Revised: 07/29/2015 Document Reviewed: 03/27/2014 Elsevier Interactive Patient Education  2017 [employer].

## 2022-09-26 NOTE — Progress Notes (Signed)
Subjective:   Christine [name] is a 72 y.o. female who presents for Medicare Annual (Subsequent) preventive examination.  Visit Complete: Virtual  I connected with  Christine [name] on 09/26/22 by a audio enabled telemedicine application and verified that I am speaking with the correct person using two identifiers.  Patient Location: Home  Provider Location: Office/Clinic  I discussed the limitations of evaluation and management by telemedicine. The patient expressed understanding and agreed to proceed.  Patient Medicare AWV questionnaire was completed by the patient on 09/25/22; I have confirmed that all information answered by patient is correct and no changes since this date.  Review of Systems      Cardiac Risk Factors include: advanced age (>57men, >62 women);hypertension;dyslipidemia     Objective:    Today's Vitals   09/26/22 0815  BP: 105/76  Pulse: 77  SpO2: 97%  Weight: 146 lb (66.2 kg)  Height: 5\' 6"  (1.676 m)   Body mass index is 23.57 kg/m.     09/26/2022    8:25 AM 09/22/2021    9:24 AM 09/20/2020   10:16 AM 09/17/2019    9:11 AM 12/28/2014   10:37 AM  Advanced Directives  Does Patient Have a Medical Advance Directive? Yes No No No No  Type of Estate agent of [place];Living will      Copy of Healthcare Power of Attorney in Chart? No - copy requested      Would patient like information on creating a medical advance directive?  No - Patient declined  Yes (MAU/Ambulatory/Procedural Areas - Information given)     Current Medications (verified) Outpatient Encounter Medications as of 09/26/2022  Medication Sig   aspirin EC 81 MG tablet Take 81 mg by mouth every other day.   B Complex Vitamins (VITAMIN B COMPLEX PO) Take by mouth.   Cholecalciferol (VITAMIN D PO) Take 10,000 Units by mouth daily.    Coenzyme Q10 100 MG TABS Take by mouth daily.   estradiol (ESTRACE) 0.1 MG/GM vaginal cream 1 gm topically weekly as needed   Estradiol  10 MCG TABS vaginal tablet PLACE 1 INSERT VAGINALLY TWICE WEEKLY   fexofenadine (ALLEGRA) 180 MG tablet Take 180 mg by mouth daily. Alternating with Zyrtec   MAGNESIUM PO Take 800 Units by mouth daily.    MELATONIN PO Take by mouth as needed. Reported on 08/13/2015   Multiple Vitamins-Minerals (HAIR/SKIN/NAILS/BIOTIN) TABS Take 3 tablets by mouth daily.   Omega-3 Fatty Acids (FISH OIL PO) Take 1,200 mg by mouth.    Probiotic Product (PROBIOTIC DAILY PO) Take by mouth daily.   rizatriptan (MAXALT) 10 MG tablet Take 1 tablet (10 mg total) by mouth once as needed for migraine. May repeat in 2 hours if needed   spironolactone (ALDACTONE) 100 MG tablet Take 2 tablets (200 mg total) by mouth 2 (two) times daily.   valACYclovir (VALTREX) 1000 MG tablet With onset of symptoms take 2 tablets and repeat in 12 hours.   COD LIVER OIL PO Take by mouth daily. (Patient not taking: Reported on 09/26/2022)   No facility-administered encounter medications on file as of 09/26/2022.    Allergies (verified) Bactrim [sulfamethoxazole-trimethoprim], Dexamethasone, Lipitor [atorvastatin], and Monistat [miconazole]   History: Past Medical History:  Diagnosis Date   AC (acromioclavicular) joint bone spurs    Allergy    Anemia    Anxiety    Degenerative arthritis    GERD (gastroesophageal reflux disease)    Goiter    multi nodular  History of anemia    History of PCOS    Hyperlipidemia    Hypertension    Nevus of left eye    Nuclear cataract 06/13/2011   PCOS (polycystic ovarian syndrome)    Status post intraocular lens implant 06/13/2011   Past Surgical History:  Procedure Laterality Date   62 HOUR PH STUDY  04/16/2019   Procedure: 24 HOUR PH STUDY;  Surgeon: Napoleon Form, MD;  Location: [facility];  Service: Endoscopy;;   BUNIONECTOMY Bilateral 2006   Feet   CATARACT EXTRACTION Left    COLONOSCOPY     ESOPHAGEAL MANOMETRY N/A 04/16/2019   Procedure: ESOPHAGEAL MANOMETRY (EM) with 24 hour  PH Impedence study;  Surgeon: Napoleon Form, MD;  Location: [facility];  Service: Endoscopy;  Laterality: N/A;   HERNIA REPAIR Right 1959   PH IMPEDANCE STUDY N/A 04/16/2019   Procedure: PH IMPEDANCE STUDY;  Surgeon: Napoleon Form, MD;  Location: [facility];  Service: Endoscopy;  Laterality: N/A;   TOE SURGERY Left 2006   Bone Spur   TONSILLECTOMY AND ADENOIDECTOMY  1959   TUBAL LIGATION  1995   Family History  Problem Relation Age of Onset   Thyroid disease Mother        hyperthyroidism   Hypertension Mother    Hyperlipidemia Mother    Heart disease Mother 31   Atrial fibrillation Mother    Congestive Heart Failure Mother    Heart disease Father    Lung cancer Father    Liver cancer Father    Cancer Brother 47       sarcoma   Hyperlipidemia Brother    Heart attack Brother 28   Heart disease Brother    Thyroid disease Daughter    Other Daughter        gluten sensetivity   Polycystic ovary syndrome Daughter    Emphysema Maternal Grandfather    Stroke Paternal Grandmother    Dementia Paternal Grandmother    Colon cancer Neg Hx    Esophageal cancer Neg Hx    Rectal cancer Neg Hx    Stomach cancer Neg Hx    Social History   Socioeconomic History   Marital status: Married    Spouse name: John   Number of children: 3   Years of education: some college   Highest education level: Not on file  Occupational History   Occupation: Advertising copywriter    Comment: Retired  Tobacco Use   Smoking status: Never    Passive exposure: Never   Smokeless tobacco: Never  Vaping Use   Vaping status: Never Used  Substance and Sexual Activity   Alcohol use: Yes    Alcohol/week: 0.0 - 1.0 standard drinks of alcohol    Comment: once a month   Drug use: No   Sexual activity: Yes    Partners: Male    Birth control/protection: Surgical, Post-menopausal    Comment: BTL  Other Topics Concern   Not on file  Social History Narrative   05/06/21   From: [facility],  parents were [employer] - moved to [place] 1974   Living: with husband, [name] [phone])   Work: retired - Therapist, nutritional       Family: 3 children - [name], [name] - 5 granddaughters - [place], [place], and [place]      Enjoys: singing, sewing, reading      Exercise: [employer] - several times a week, and walking the german shepard   Diet: generally diet  Safety   Seat belts: Yes    Guns: Yes  and secure   Safe in relationships: Yes       No living will   Husband is health care POA---children are alternates   Would accept resuscitation attempts   Social Determinants of Health   Financial Resource Strain: Low Risk  (09/26/2022)   Overall Financial Resource Strain (CARDIA)    Difficulty of Paying Living Expenses: Not hard at all  Food Insecurity: No Food Insecurity (09/26/2022)   Hunger Vital Sign    Worried About Running Out of Food in the Last Year: Never true    Ran Out of Food in the Last Year: Never true  Transportation Needs: No Transportation Needs (09/26/2022)   PRAPARE - Administrator, Civil Service (Medical): No    Lack of Transportation (Non-Medical): No  Physical Activity: Sufficiently Active (09/26/2022)   Exercise Vital Sign    Days of Exercise per Week: 5 days    Minutes of Exercise per Session: 60 min  Stress: Stress Concern Present (09/26/2022)   [employer] of Occupational Health - Occupational Stress Questionnaire    Feeling of Stress : To some extent  Social Connections: Socially Integrated (09/26/2022)   Social Connection and Isolation Panel [NHANES]    Frequency of Communication with Friends and Family: More than three times a week    Frequency of Social Gatherings with Friends and Family: More than three times a week    Attends Religious Services: More than 4 times per year    Active Member of [employer] or Organizations: Yes    Attends Engineer, structural: More than 4 times per year    Marital Status: Married    Tobacco Counseling Counseling  given: Not Answered   Clinical Intake:  Pre-visit preparation completed: Yes  Pain : No/denies pain     BMI - recorded: 23.57 Nutritional Status: BMI of 19-24  Normal Nutritional Risks: None Diabetes: No  How often do you need to have someone help you when you read instructions, pamphlets, or other written materials from your doctor or pharmacy?: 1 - Never  Interpreter Needed?: No  Information entered by :: C.Canon Gola LPN   Activities of Daily Living    09/25/2022    5:30 PM  In your present state of health, do you have any difficulty performing the following activities:  Hearing? 0  Vision? 0  Difficulty concentrating or making decisions? 0  Walking or climbing stairs? 0  Dressing or bathing? 0  Doing errands, shopping? 0  Preparing Food and eating ? N  Using the Toilet? N  In the past six months, have you accidently leaked urine? Y  Comment occasionally  Do you have problems with loss of bowel control? N  Managing your Medications? N  Managing your Finances? N  Housekeeping or managing your Housekeeping? N    Patient Care Team: Karie Schwalbe, MD as PCP - General (Internal Medicine) Jerene Bears, MD as Consulting Physician (Gynecology) Luis Abed, MD as Consulting Physician (Cardiology) Louis Meckel, MD (Inactive) as Consulting Physician (Gastroenterology) Mateo Flow, MD as Consulting Physician (Ophthalmology) Marvis Repress, MD as Referring Physician (Ophthalmology) Trey Sailors, MD as Attending Physician (Neurosurgery) [place], Elvin So, MD as Referring Physician (Dermatology) Eldred Manges, MD as Consulting Physician (Orthopedic Surgery)  Indicate any recent Medical Services you may have received from other than [facility] providers in the past year (date may be approximate).     Assessment:  This is a routine wellness examination for [place].  Hearing/Vision screen Hearing Screening - Comments:: Denies hearing difficulties   Vision  Screening - Comments:: Glasses - Heckler - UTD on eye exams  Dietary issues and exercise activities discussed:     Goals Addressed             This Visit's Progress    Patient Stated       Would like to get weight down to 140 pounds.       Depression Screen    09/26/2022    8:23 AM 10/26/2021    2:55 PM 09/22/2021    9:20 AM 05/06/2021    9:35 AM 10/15/2020    9:14 AM 09/20/2020   10:18 AM 09/17/2019    9:37 AM  PHQ 2/9 Scores  PHQ - 2 Score 0 0 0 0 0 0 0    Fall Risk    09/25/2022    5:30 PM 09/22/2021    9:23 AM 09/20/2020   10:17 AM 09/17/2019    9:36 AM 08/23/2018    9:19 AM  Fall Risk   Falls in the past year? 0 0 0 1 0  Number falls in past yr: 0 0 0 0 0  Injury with Fall? 0 0 0 0 0  Risk for fall due to : No Fall Risks No Fall Risks  No Fall Risks   Follow up Falls evaluation completed;Falls prevention discussed   Falls prevention discussed Falls evaluation completed    MEDICARE RISK AT HOME:  Medicare Risk at Home - 09/25/22 1730     Home free of loose throw rugs in walkways, pet beds, electrical cords, etc? No   Throw rugs            TIMED UP AND GO:  Was the test performed?  No    Cognitive Function:        09/26/2022    8:29 AM 09/22/2021    9:24 AM 08/23/2018    9:21 AM  6CIT Screen  What Year? 0 points 0 points 0 points  What month? 0 points 0 points 0 points  What time? 0 points 0 points 0 points  Count back from 20 0 points 0 points 0 points  Months in reverse 0 points 0 points 0 points  Repeat phrase 2 points 0 points 0 points  Total Score 2 points 0 points 0 points    Immunizations Immunization History  Administered Date(s) Administered   Influenza Split 12/23/2013, 12/24/2014   Influenza Whole 12/16/2012   Influenza, High Dose Seasonal PF 12/15/2016, 11/18/2017, 12/24/2018   Influenza,inj,quad, With Preservative 12/24/2015   Influenza-Unspecified 12/20/2016   Pneumococcal Conjugate-13 12/15/2016   Pneumococcal  Polysaccharide-23 08/24/2004   Tdap 11/28/2010, 10/26/2021   Zoster Recombinant(Shingrix) 12/27/2016, 04/03/2017   Zoster, Live 01/19/2012    TDAP status: Up to date  Flu Vaccine status: Declined, Education has been provided regarding the importance of this vaccine but patient still declined. Advised may receive this vaccine at local pharmacy or Health Dept. Aware to provide a copy of the vaccination record if obtained from local pharmacy or Health Dept. Verbalized acceptance and understanding.  Pneumococcal vaccine status: Declined,  Education has been provided regarding the importance of this vaccine but patient still declined. Advised may receive this vaccine at local pharmacy or Health Dept. Aware to provide a copy of the vaccination record if obtained from local pharmacy or Health Dept. Verbalized acceptance and understanding.   Covid-19 vaccine status: Declined, Education has  been provided regarding the importance of this vaccine but patient still declined. Advised may receive this vaccine at local pharmacy or Health Dept.or vaccine clinic. Aware to provide a copy of the vaccination record if obtained from local pharmacy or Health Dept. Verbalized acceptance and understanding.  Qualifies for Shingles Vaccine? Yes   Zostavax completed Yes   Shingrix Completed?: Yes  Screening Tests Health Maintenance  Topic Date Due   Hepatitis C Screening  Never done   Pneumonia Vaccine 63+ Years old (3 of 3 - PPSV23 or PCV20) 12/15/2021   Medicare Annual Wellness (AWV)  09/23/2022   INFLUENZA VACCINE  10/05/2022   MAMMOGRAM  09/10/2023   Colonoscopy  02/04/2025   DTaP/Tdap/Td (3 - Td or Tdap) 10/27/2031   DEXA SCAN  Completed   Zoster Vaccines- Shingrix  Completed   HPV VACCINES  Aged Out   COVID-19 Vaccine  Discontinued    Health Maintenance  Health Maintenance Due  Topic Date Due   Hepatitis C Screening  Never done   Pneumonia Vaccine 32+ Years old (3 of 3 - PPSV23 or PCV20) 12/15/2021    Medicare Annual Wellness (AWV)  09/23/2022    Colorectal cancer screening: Type of screening: Colonoscopy. Completed 02/05/15. Repeat every 10 years  Mammogram status: Completed 09/09/21. Repeat every year GYN orders, pt has appointment in August.  Bone Density status: Completed 02/17/21. Results reflect: Bone density results: NORMAL. Repeat every 5 years.  Lung Cancer Screening: (Low Dose CT Chest recommended if Age 38-80 years, 20 pack-year currently smoking OR have quit w/in 15years.) does not qualify.   Lung Cancer Screening Referral: n/a  Additional Screening:  Hepatitis C Screening: does qualify; Completed DUE at next OV.  Vision Screening: Recommended annual ophthalmology exams for early detection of glaucoma and other disorders of the eye. Is the patient up to date with their annual eye exam?  Yes  Who is the provider or what is the name of the office in which the patient attends annual eye exams? Dr.Heckler If pt is not established with a provider, would they like to be referred to a provider to establish care? Yes .   Dental Screening: Recommended annual dental exams for proper oral hygiene    Community Resource Referral / Chronic Care Management: CRR required this visit?  No   CCM required this visit?  No     Plan:     I have personally reviewed and noted the following in the patient's chart:   Medical and social history Use of alcohol, tobacco or illicit drugs  Current medications and supplements including opioid prescriptions. Patient is not currently taking opioid prescriptions. Functional ability and status Nutritional status Physical activity Advanced directives List of other physicians Hospitalizations, surgeries, and ER visits in previous 12 months Vitals Screenings to include cognitive, depression, and falls Referrals and appointments  In addition, I have reviewed and discussed with patient certain preventive protocols, quality metrics, and  best practice recommendations. A written personalized care plan for preventive services as well as general preventive health recommendations were provided to patient.     Maryan Puls, LPN   4/40/3474   After Visit Summary: (MyChart) Due to this being a telephonic visit, the after visit summary with patients personalized plan was offered to patient via MyChart   Nurse Notes: Vaccinations: declines  Influenza vaccine: recommend every Fall Pneumococcal vaccine: recommend once per lifetime Prevnar-20    Covid-19: recommend 2 doses one month apart with a booster 6 months later

## 2022-10-20 ENCOUNTER — Ambulatory Visit (HOSPITAL_BASED_OUTPATIENT_CLINIC_OR_DEPARTMENT_OTHER): Payer: Medicare Other | Admitting: Obstetrics & Gynecology

## 2022-11-02 ENCOUNTER — Ambulatory Visit (INDEPENDENT_AMBULATORY_CARE_PROVIDER_SITE_OTHER): Payer: Medicare Other | Admitting: Obstetrics & Gynecology

## 2022-11-02 ENCOUNTER — Other Ambulatory Visit (HOSPITAL_COMMUNITY)
Admission: RE | Admit: 2022-11-02 | Discharge: 2022-11-02 | Disposition: A | Discharge status: Home / Self Care | Payer: Medicare Other | Source: Ambulatory Visit | Attending: Obstetrics & Gynecology | Admitting: Obstetrics & Gynecology

## 2022-11-02 ENCOUNTER — Encounter (HOSPITAL_BASED_OUTPATIENT_CLINIC_OR_DEPARTMENT_OTHER): Payer: Self-pay | Admitting: Obstetrics & Gynecology

## 2022-11-02 VITALS — BP 129/82 | HR 80 | Ht 66.0 in | Wt 147.8 lb

## 2022-11-02 DIAGNOSIS — Z780 Asymptomatic menopausal state: Secondary | ICD-10-CM

## 2022-11-02 DIAGNOSIS — Z124 Encounter for screening for malignant neoplasm of cervix: Secondary | ICD-10-CM

## 2022-11-02 DIAGNOSIS — N952 Postmenopausal atrophic vaginitis: Secondary | ICD-10-CM

## 2022-11-02 DIAGNOSIS — Z8619 Personal history of other infectious and parasitic diseases: Secondary | ICD-10-CM | POA: Diagnosis not present

## 2022-11-02 DIAGNOSIS — B002 Herpesviral gingivostomatitis and pharyngotonsillitis: Secondary | ICD-10-CM

## 2022-11-02 DIAGNOSIS — Z9189 Other specified personal risk factors, not elsewhere classified: Secondary | ICD-10-CM | POA: Diagnosis not present

## 2022-11-02 MED ORDER — VALACYCLOVIR HCL 1 G PO TABS
ORAL_TABLET | ORAL | 1 refills | Status: DC
Start: 2022-11-02 — End: 2023-11-14

## 2022-11-02 MED ORDER — ESTRADIOL 10 MCG VA TABS
ORAL_TABLET | VAGINAL | 4 refills | Status: DC
Start: 2022-11-02 — End: 2023-11-14

## 2022-11-02 NOTE — Progress Notes (Signed)
71 y.o. [id number] Married White or Caucasian female here for breast and pelvic exam.  I am also following her for her menopausal symptoms.  Using vaginal estradiol tablets and vaginal estrogen cream.  Denies vaginal bleeding.    Has had some frustrations with PCP.    Uses Valtrex for oral HSV.  Patient's last menstrual period was 12/05/2010.          Sexually active: Yes.     Health Maintenance: PCP:  Dr. Alphonsus Sias.  Did lab work in 03/2022 Colonoscopy:  2016, Dr. Lavon Paganini MMG:  09/09/2021.  Pt is going to repeat next year. BMD:  02/2021, normal Last pap smear:  2021.   H/o abnormal pap smear:  remote hx    reports that she has never smoked. She has never been exposed to tobacco smoke. She has never used smokeless tobacco. She reports current alcohol use. She reports that she does not use drugs.  Past Medical History:  Diagnosis Date   AC (acromioclavicular) joint bone spurs    Allergy    Anemia    Anxiety    Degenerative arthritis    GERD (gastroesophageal reflux disease)    Goiter    multi nodular    History of anemia    History of PCOS    Hyperlipidemia    Hypertension    Nevus of left eye    Nuclear cataract 06/13/2011   PCOS (polycystic ovarian syndrome)    Status post intraocular lens implant 06/13/2011    Past Surgical History:  Procedure Laterality Date   43 HOUR PH STUDY  04/16/2019   Procedure: 24 HOUR PH STUDY;  Surgeon: Napoleon Form, MD;  Location: [facility];  Service: Endoscopy;;   BUNIONECTOMY Bilateral 2006   Feet   CATARACT EXTRACTION Left    COLONOSCOPY     ESOPHAGEAL MANOMETRY N/A 04/16/2019   Procedure: ESOPHAGEAL MANOMETRY (EM) with 24 hour PH Impedence study;  Surgeon: Napoleon Form, MD;  Location: [facility];  Service: Endoscopy;  Laterality: N/A;   HERNIA REPAIR Right 1959   PH IMPEDANCE STUDY N/A 04/16/2019   Procedure: PH IMPEDANCE STUDY;  Surgeon: Napoleon Form, MD;  Location: [facility];  Service: Endoscopy;  Laterality:  N/A;   TOE SURGERY Left 2006   Bone Spur   TONSILLECTOMY AND ADENOIDECTOMY  1959   TUBAL LIGATION  1995    Current Outpatient Medications  Medication Sig Dispense Refill   aspirin EC 81 MG tablet Take 81 mg by mouth every other day.     B Complex Vitamins (VITAMIN B COMPLEX PO) Take by mouth.     Cholecalciferol (VITAMIN D PO) Take 10,000 Units by mouth daily.      COD LIVER OIL PO Take by mouth daily.     Coenzyme Q10 100 MG TABS Take by mouth daily.     estradiol (ESTRACE) 0.1 MG/GM vaginal cream 1 gm topically weekly as needed 1 g 0   fexofenadine (ALLEGRA) 180 MG tablet Take 180 mg by mouth daily. Alternating with Zyrtec     MAGNESIUM PO Take 800 Units by mouth daily.      MELATONIN PO Take by mouth as needed. Reported on 08/13/2015     Multiple Vitamins-Minerals (HAIR/SKIN/NAILS/BIOTIN) TABS Take 3 tablets by mouth daily.     Omega-3 Fatty Acids (FISH OIL PO) Take 1,200 mg by mouth.      Probiotic Product (PROBIOTIC DAILY PO) Take by mouth daily.     rizatriptan (MAXALT) 10 MG tablet Take 1  tablet (10 mg total) by mouth once as needed for migraine. May repeat in 2 hours if needed 30 tablet 2   spironolactone (ALDACTONE) 100 MG tablet Take 2 tablets (200 mg total) by mouth 2 (two) times daily. 1 tablet 0   valACYclovir (VALTREX) 1000 MG tablet With onset of symptoms take 2 tablets and repeat in 12 hours. 90 tablet 1   Estradiol 10 MCG TABS vaginal tablet PLACE 1 INSERT VAGINALLY TWICE WEEKLY 24 tablet 4   No current facility-administered medications for this visit.    Family History  Problem Relation Age of Onset   Thyroid disease Mother        hyperthyroidism   Hypertension Mother    Hyperlipidemia Mother    Heart disease Mother 90   Atrial fibrillation Mother    Congestive Heart Failure Mother    Heart disease Father    Lung cancer Father    Liver cancer Father    Cancer Brother 24       sarcoma   Hyperlipidemia Brother    Heart attack Brother 54   Heart disease  Brother    Thyroid disease Daughter    Other Daughter        gluten sensetivity   Polycystic ovary syndrome Daughter    Emphysema Maternal Grandfather    Stroke Paternal Grandmother    Dementia Paternal Grandmother    Colon cancer Neg Hx    Esophageal cancer Neg Hx    Rectal cancer Neg Hx    Stomach cancer Neg Hx     Review of Systems  Constitutional: Negative.   Genitourinary: Negative.     Exam:   BP 129/82 (BP Location: Left Arm, Patient Position: Sitting, Cuff Size: Normal)   Pulse 80   Ht 5\' 6"  (1.676 m)   Wt 147 lb 12.8 oz (67 kg)   LMP 12/05/2010   BMI 23.86 kg/m   Height: 5\' 6"  (167.6 cm)  General appearance: alert, cooperative and appears stated age Breasts: normal appearance, no masses or tenderness Abdomen: soft, non-tender; bowel sounds normal; no masses,  no organomegaly Lymph nodes: Cervical, supraclavicular, and axillary nodes normal.  No abnormal inguinal nodes palpated Neurologic: Grossly normal  Pelvic: External genitalia:  no lesions              Urethra:  normal appearing urethra with no masses, tenderness or lesions              Bartholins and Skenes: normal                 Vagina: normal appearing vagina with atrophic changes and no discharge, no lesions              Cervix: no lesions              Pap taken: Yes.   Bimanual Exam:  Uterus:  normal size, contour, position, consistency, mobility, non-tender              Adnexa: normal adnexa and no mass, fullness, tenderness               Rectovaginal: Confirms               Anus:  normal sphincter tone, no lesions  Chaperone, Ina Homes, CMA, was present for exam.  Assessment/Plan: 1. GYN exam for high-risk Medicare patient - Pap smear obtained today - Mammogram 2023.  Pt is going to repeat this next year. - Colonoscopy 2016.  Will be due in 2026 -  Bone mineral density 2022 - lab work done with PCP, Dr. Alphonsus Sias - vaccines reviewed/updated  2. Cervical cancer screening - PR OBTAINING  SCREEN PAP SMEAR - Cytology - PAP( [place])  3. Postmenopausal - Estradiol 10 MCG TABS vaginal tablet; PLACE 1 INSERT VAGINALLY TWICE WEEKLY  Dispense: 24 tablet; Refill: 4  4. Postmenopausal atrophic vaginitis  5. Oral herpes - valACYclovir (VALTREX) 1000 MG tablet; With onset of symptoms take 2 tablets and repeat in 12 hours.  Dispense: 90 tablet; Refill: 1

## 2022-11-09 LAB — CYTOLOGY - PAP
Adequacy: ABSENT
Diagnosis: NEGATIVE

## 2023-04-10 ENCOUNTER — Ambulatory Visit: Payer: Medicare Other | Admitting: Internal Medicine

## 2023-04-10 ENCOUNTER — Encounter: Payer: Self-pay | Admitting: Internal Medicine

## 2023-04-10 VITALS — BP 126/70 | HR 91 | Temp 98.9°F | Ht 65.5 in | Wt 146.0 lb

## 2023-04-10 DIAGNOSIS — E042 Nontoxic multinodular goiter: Secondary | ICD-10-CM | POA: Diagnosis not present

## 2023-04-10 DIAGNOSIS — R5383 Other fatigue: Secondary | ICD-10-CM | POA: Diagnosis not present

## 2023-04-10 DIAGNOSIS — H532 Diplopia: Secondary | ICD-10-CM

## 2023-04-10 DIAGNOSIS — N952 Postmenopausal atrophic vaginitis: Secondary | ICD-10-CM | POA: Diagnosis not present

## 2023-04-10 DIAGNOSIS — E282 Polycystic ovarian syndrome: Secondary | ICD-10-CM

## 2023-04-10 DIAGNOSIS — Z0000 Encounter for general adult medical examination without abnormal findings: Secondary | ICD-10-CM

## 2023-04-10 LAB — COMPREHENSIVE METABOLIC PANEL
ALT: 13 U/L (ref 0–35)
AST: 16 U/L (ref 0–37)
Albumin: 4.8 g/dL (ref 3.5–5.2)
Alkaline Phosphatase: 130 U/L — ABNORMAL HIGH (ref 39–117)
BUN: 17 mg/dL (ref 6–23)
CO2: 29 meq/L (ref 19–32)
Calcium: 9.6 mg/dL (ref 8.4–10.5)
Chloride: 99 meq/L (ref 96–112)
Creatinine, Ser: 0.83 mg/dL (ref 0.40–1.20)
GFR: 70.96 mL/min (ref >60.00)
Glucose, Bld: 95 mg/dL (ref 70–99)
Potassium: 4.6 meq/L (ref 3.5–5.1)
Sodium: 138 meq/L (ref 135–145)
Total Bilirubin: 0.7 mg/dL (ref 0.2–1.2)
Total Protein: 7 g/dL (ref 6.0–8.3)

## 2023-04-10 LAB — CBC
HCT: 43.4 % (ref 36.0–46.0)
Hemoglobin: 14.4 g/dL (ref 12.0–15.0)
MCHC: 33.1 g/dL (ref 30.0–36.0)
MCV: 92.7 fL (ref 78.0–100.0)
Platelets: 321 10*3/uL (ref 150.0–400.0)
RBC: 4.68 Mil/uL (ref 3.87–5.11)
RDW: 13.4 % (ref 11.5–15.5)
WBC: 14 10*3/uL — ABNORMAL HIGH (ref 4.0–10.5)

## 2023-04-10 LAB — SEDIMENTATION RATE: Sed Rate: 22 mm/h (ref 0–30)

## 2023-04-10 LAB — TSH: TSH: 1.4 u[IU]/mL (ref 0.35–5.50)

## 2023-04-10 NOTE — Assessment & Plan Note (Signed)
Non specific Could be related to her sleep issues---discussed sleep contraction and hygiene Will check for MG due to diplopia

## 2023-04-10 NOTE — Assessment & Plan Note (Signed)
Continues on spironolactone for this

## 2023-04-10 NOTE — Progress Notes (Signed)
Hearing Screening - Comments:: Passed whisper test Vision Screening - Comments:: March 2024  

## 2023-04-10 NOTE — Progress Notes (Signed)
 Subjective:    Patient ID: Christine [name], female    DOB: [birth year], 73 y.o.   MRN: [id number]  HPI Here for Medicare wellness visit and follow up of chronic health conditions Reviewed advanced directives Reviewed other doctors-----Dr [facility] (Dr Fleeta), Dr Bettye ([facility]), Dr Jeri, Dr Tomi No hospitalizations or surgery this year Exercises regularly-- class at [employer], walks dog, etc Vision is okay---some diplopia but has prism  in glasses (getting evaluation) Hearing is fine Rare wine No tobacco No falls No depression or anhedonia Independent with instrumental ADLs No memory issues  Chronic sleep issues Has some real fatigue some days--though stays active. Not always after a bad night Thinks vitamins help--but not as consistent when travelling Does use melatonin for sleep---?10mg  No apnea as far as she knows   Uses estrogen tablet twice a week Cream prn---usually before sex (or if worsens) Spironolactone  for PCOS  Past multinodular goiter Last ultrasound 2019---stable over 5 years  Off the statin  Current Outpatient Medications on File Prior to Visit  Medication Sig Dispense Refill   aspirin EC 81 MG tablet Take 81 mg by mouth every other day.     B Complex Vitamins (VITAMIN B COMPLEX PO) Take by mouth.     Cholecalciferol (VITAMIN D  PO) Take 10,000 Units by mouth daily.      Coenzyme Q10 100 MG TABS Take by mouth daily.     estradiol  (ESTRACE ) 0.1 MG/GM vaginal cream 1 gm topically weekly as needed 1 g 0   Estradiol  10 MCG TABS vaginal tablet PLACE 1 INSERT VAGINALLY TWICE WEEKLY 24 tablet 4   fexofenadine (ALLEGRA) 180 MG tablet Take 180 mg by mouth daily. Alternating with Zyrtec     MAGNESIUM PO Take 800 Units by mouth daily.      MELATONIN PO Take by mouth as needed. Reported on 08/13/2015     Multiple Vitamins-Minerals (HAIR/SKIN/NAILS/BIOTIN) TABS Take 3 tablets by mouth daily.     Omega-3 Fatty Acids (FISH OIL PO) Take  1,200 mg by mouth.      Probiotic Product (PROBIOTIC DAILY PO) Take by mouth daily.     rizatriptan  (MAXALT ) 10 MG tablet Take 1 tablet (10 mg total) by mouth once as needed for migraine. May repeat in 2 hours if needed 30 tablet 2   spironolactone  (ALDACTONE ) 100 MG tablet Take 2 tablets (200 mg total) by mouth 2 (two) times daily. 1 tablet 0   valACYclovir  (VALTREX ) 1000 MG tablet With onset of symptoms take 2 tablets and repeat in 12 hours. 90 tablet 1   No current facility-administered medications on file prior to visit.    Allergies  Allergen Reactions   Bactrim [Sulfamethoxazole-Trimethoprim] Itching   Dexamethasone  Other (See Comments)    Increased intraocular pressure   Lipitor [Atorvastatin] Other (See Comments)    headache   Monistat [Miconazole] Itching    burning    Past Medical History:  Diagnosis Date   AC (acromioclavicular) joint bone spurs    Allergy    Anemia    Anxiety    Degenerative arthritis    GERD (gastroesophageal reflux disease)    Goiter    multi nodular    History of anemia    History of PCOS    Hyperlipidemia    Hypertension    Nevus of left eye    Nuclear cataract 06/13/2011   PCOS (polycystic ovarian syndrome)    Status post intraocular lens implant 06/13/2011    Past Surgical History:  Procedure Laterality  Date   45 HOUR PH STUDY  04/16/2019   Procedure: 24 HOUR PH STUDY;  Surgeon: Shila Gustav GAILS, MD;  Location: [facility];  Service: Endoscopy;;   BUNIONECTOMY Bilateral 2006   Feet   CATARACT EXTRACTION Left    COLONOSCOPY     ESOPHAGEAL MANOMETRY N/A 04/16/2019   Procedure: ESOPHAGEAL MANOMETRY (EM) with 24 hour PH Impedence study;  Surgeon: Shila Gustav GAILS, MD;  Location: [facility];  Service: Endoscopy;  Laterality: N/A;   HERNIA REPAIR Right 1959   PH IMPEDANCE STUDY N/A 04/16/2019   Procedure: PH IMPEDANCE STUDY;  Surgeon: Shila Gustav GAILS, MD;  Location: [facility];  Service: Endoscopy;  Laterality: N/A;   TOE  SURGERY Left 2006   Bone Spur   TONSILLECTOMY AND ADENOIDECTOMY  1959   TUBAL LIGATION  1995    Family History  Problem Relation Age of Onset   Thyroid  disease Mother        hyperthyroidism   Hypertension Mother    Hyperlipidemia Mother    Heart disease Mother 75   Atrial fibrillation Mother    Congestive Heart Failure Mother    Heart disease Father    Lung cancer Father    Liver cancer Father    Cancer Brother 61       sarcoma   Hyperlipidemia Brother    Heart attack Brother 50   Heart disease Brother    Thyroid  disease Daughter    Other Daughter        gluten sensetivity   Polycystic ovary syndrome Daughter    Emphysema Maternal Grandfather    Stroke Paternal Grandmother    Dementia Paternal Grandmother    Colon cancer Neg Hx    Esophageal cancer Neg Hx    Rectal cancer Neg Hx    Stomach cancer Neg Hx     Social History   Socioeconomic History   Marital status: Married    Spouse name: John   Number of children: 3   Years of education: some college   Highest education level: Not on file  Occupational History   Occupation: Advertising copywriter    Comment: Retired  Tobacco Use   Smoking status: Never    Passive exposure: Never   Smokeless tobacco: Never  Vaping Use   Vaping status: Never Used  Substance and Sexual Activity   Alcohol use: Yes    Alcohol/week: 0.0 - 1.0 standard drinks of alcohol    Comment: once a month   Drug use: No   Sexual activity: Yes    Partners: Male    Birth control/protection: Surgical, Post-menopausal    Comment: BTL  Other Topics Concern   Not on file  Social History Narrative   05/06/21   From: Born [place] , parents were [employer] - moved to [place] 1974   Living: with husband, Norleen [phone])   Work: retired - therapist, nutritional       Family: 3 children - [name], Lauraine Righter - 5 granddaughters - [place] , [place], and [place]      Enjoys: singing, sewing, reading      Exercise: [employer] - several times a week, and walking the german shepard    Diet: generally diet      No living will   Husband is health care POA---children are alternates   Would accept resuscitation attempts   No prolonged ventilation or tube feeds   Social Drivers of Health   Financial Resource Strain: Low Risk  (09/26/2022)   Overall Financial Resource Strain (CARDIA)  Difficulty of Paying Living Expenses: Not hard at all  Food Insecurity: No Food Insecurity (09/26/2022)   Hunger Vital Sign    Worried About Running Out of Food in the Last Year: Never true    Ran Out of Food in the Last Year: Never true  Transportation Needs: No Transportation Needs (09/26/2022)   PRAPARE - Administrator, Civil Service (Medical): No    Lack of Transportation (Non-Medical): No  Physical Activity: Sufficiently Active (09/26/2022)   Exercise Vital Sign    Days of Exercise per Week: 5 days    Minutes of Exercise per Session: 60 min  Stress: Stress Concern Present (09/26/2022)   [employer] of Occupational Health - Occupational Stress Questionnaire    Feeling of Stress : To some extent  Social Connections: Socially Integrated (09/26/2022)   Social Connection and Isolation Panel [NHANES]    Frequency of Communication with Friends and Family: More than three times a week    Frequency of Social Gatherings with Friends and Family: More than three times a week    Attends Religious Services: More than 4 times per year    Active Member of [employer] or Organizations: Yes    Attends Engineer, Structural: More than 4 times per year    Marital Status: Married  Catering Manager Violence: Not At Risk (09/26/2022)   Humiliation, Afraid, Rape, and Kick questionnaire    Fear of Current or Ex-Partner: No    Emotionally Abused: No    Physically Abused: No    Sexually Abused: No   Review of Systems Appetite is okay Weight stable Sleep issues Wears seat belt Teeth are okay--keeps up with dentist No suspicious skin lesions--going to derm soon No chest pain or  SOB No syncope. Can get dizzy upon standing up quick No heartburn --but past dysphagia (dilation some years ago).  Bowels move fine--no blood Voids okay No sig back or joint pains---just aches and pains (glucosamine helps)    Objective:   Physical Exam Constitutional:      Appearance: Normal appearance.  HENT:     Mouth/Throat:     Pharynx: No oropharyngeal exudate or posterior oropharyngeal erythema.  Eyes:     Conjunctiva/sclera: Conjunctivae normal.     Pupils: Pupils are equal, round, and reactive to light.  Cardiovascular:     Rate and Rhythm: Normal rate and regular rhythm.     Pulses: Normal pulses.     Heart sounds: No murmur heard.    No gallop.  Pulmonary:     Effort: Pulmonary effort is normal.     Breath sounds: Normal breath sounds. No wheezing or rales.  Abdominal:     Palpations: Abdomen is soft.     Tenderness: There is no abdominal tenderness.  Musculoskeletal:     Cervical back: Neck supple.     Right lower leg: No edema.     Left lower leg: No edema.  Lymphadenopathy:     Cervical: No cervical adenopathy.  Skin:    Findings: No rash.  Neurological:     General: No focal deficit present.     Mental Status: She is alert and oriented to person, place, and time.     Comments: Mini-cog---okay (clock normal, recall 2/3)  Psychiatric:        Mood and Affect: Mood normal.        Behavior: Behavior normal.            Assessment & Plan:

## 2023-04-10 NOTE — Assessment & Plan Note (Signed)
Uses topical estrogen

## 2023-04-10 NOTE — Assessment & Plan Note (Signed)
 Check TSH

## 2023-04-10 NOTE — Assessment & Plan Note (Signed)
I have personally reviewed the Medicare Annual Wellness questionnaire and have noted 1. The patient's medical and social history 2. Their use of alcohol, tobacco or illicit drugs 3. Their current medications and supplements 4. The patient's functional ability including ADL's, fall risks, home safety risks and hearing or visual             impairment. 5. Diet and physical activities 6. Evidence for depression or mood disorders  The patients weight, height, BMI and visual acuity have been recorded in the chart I have made referrals, counseling and provided education to the patient based review of the above and I have provided the pt with a written personalized care plan for preventive services.  I have provided you with a copy of your personalized plan for preventive services. Please take the time to review along with your updated medication list.  Colon due next year Due for mammogram--should be yearly while on estrogen Exercises regularly Prefers no flu/COVID vaccines---UTD otherwise

## 2023-04-11 ENCOUNTER — Encounter: Payer: Self-pay | Admitting: Internal Medicine

## 2023-04-24 LAB — ACHR ABS WITH REFLEX TO MUSK: AChR Binding Ab, Serum: <0.03 nmol/L (ref 0.00–0.24)

## 2023-04-24 LAB — MUSK ANTIBODIES: MuSK Antibodies: <1 U/mL

## 2023-05-31 LAB — HM DIABETES EYE EXAM

## 2023-11-14 ENCOUNTER — Encounter (HOSPITAL_BASED_OUTPATIENT_CLINIC_OR_DEPARTMENT_OTHER): Payer: Self-pay | Admitting: Obstetrics & Gynecology

## 2023-11-14 ENCOUNTER — Ambulatory Visit (HOSPITAL_BASED_OUTPATIENT_CLINIC_OR_DEPARTMENT_OTHER): Admitting: Obstetrics & Gynecology

## 2023-11-14 VITALS — BP 126/83 | HR 72 | Ht 63.0 in | Wt 140.0 lb

## 2023-11-14 DIAGNOSIS — Z9189 Other specified personal risk factors, not elsewhere classified: Secondary | ICD-10-CM | POA: Diagnosis not present

## 2023-11-14 DIAGNOSIS — N951 Menopausal and female climacteric states: Secondary | ICD-10-CM

## 2023-11-14 DIAGNOSIS — N952 Postmenopausal atrophic vaginitis: Secondary | ICD-10-CM | POA: Diagnosis not present

## 2023-11-14 DIAGNOSIS — B002 Herpesviral gingivostomatitis and pharyngotonsillitis: Secondary | ICD-10-CM

## 2023-11-14 DIAGNOSIS — Z780 Asymptomatic menopausal state: Secondary | ICD-10-CM

## 2023-11-14 MED ORDER — ESTRADIOL 10 MCG VA TABS: ORAL_TABLET | VAGINAL | 4 refills | Status: AC

## 2023-11-14 MED ORDER — VALACYCLOVIR HCL 1 G PO TABS: ORAL_TABLET | ORAL | 1 refills | Status: AC

## 2023-11-14 NOTE — Progress Notes (Signed)
 Breast and Pelvic exam Patient name: Christine [name] MRN [id number]  Date of birth: 10-27-1950 Chief Complaint:   Breast and Pelvic exam  History of Present Illness:   Christine [name] is a 73 y.o. [phone] Caucasian female being seen today for breast and pelvic exam.  Denies vaginal bleeding.  Seeing new PCP next week, Dr. Bennett.    Patient's last menstrual period was 12/05/2010.   Last pap 11/02/2022. Results were: NILM w/ HRHPV not done. H/O abnormal pap: yes 1980s Last mammogram: 09/09/2021. Results were: normal. Family h/o breast cancer: no.  Thinks she is not going to do another mammogram unless she has new symptoms.   Last colonoscopy: 02/05/2015. Results were: normal. Family h/o colorectal cancer: no Dexa:  2022.  Normal.       11/14/2023    8:48 AM 04/10/2023    8:14 AM 04/10/2023    7:45 AM 11/02/2022    1:18 PM 09/26/2022    8:23 AM  Depression screen PHQ 2/9  Decreased Interest 0 0 0 0 0  Down, Depressed, Hopeless 0 0 0 0 0  PHQ - 2 Score 0 0 0 0 0    Review of Systems:   Pertinent items are noted in HPI Denies any urinary changes, bowel changes, pelvic pain Pertinent History Reviewed:  Reviewed past medical,surgical, social and family history.  Reviewed problem list, medications and allergies. Physical Assessment:   Vitals:   11/14/23 0840  BP: 126/83  Pulse: 72  SpO2: 100%  Weight: 140 lb (63.5 kg)  Height: 5' 3 (1.6 m)  Body mass index is 24.8 kg/m.        Physical Examination:   General appearance - well appearing, and in no distress  Mental status - alert, oriented to person, place, and time  Psych:  She has a normal mood and affect  Skin - warm and dry, normal color, no suspicious lesions noted  Chest - effort normal, all lung fields clear to auscultation bilaterally  Heart - normal rate and regular rhythm  Neck:  midline trachea, no thyromegaly or nodules  Breasts - breasts appear normal, no suspicious masses, no skin or nipple changes or  axillary  nodes  Abdomen - soft, nontender, nondistended, no masses or organomegaly  Pelvic - VULVA: normal appearing vulva with no masses, tenderness or lesions   VAGINA: normal appearing vagina with normal color and discharge, no lesions   CERVIX: normal appearing cervix without discharge or lesions, no CMT  Thin prep pap is not done  UTERUS: uterus is felt to be normal size, shape, consistency and nontender   ADNEXA: No adnexal masses or tenderness noted.  Rectal - normal rectal, good sphincter tone, no masses felt.  Extremities:  No swelling or varicosities noted  Chaperone present for exam  No results found for this or any previous visit (from the past 24 hours).  Assessment & Plan:  1. GYN exam for high-risk Medicare patient (Primary) - Pap smear done 2024 - Mammogram declined - Colonoscopy 2016 - Bone mineral density 2022 - lab work:  none ordered today.   - vaccines reviewed/updated  2. Vaginal atrophy - RF for estradiol  tablets updated today.  She will let me know if she needs RF for estradiol  cream. - Estradiol  10 MCG TABS vaginal tablet; PLACE 1 INSERT VAGINALLY TWICE WEEKLY  Dispense: 24 tablet; Refill: 4  3. Postmenopausal  4. Oral herpes - valACYclovir  (VALTREX ) 1000 MG tablet; With onset of symptoms take 2 tablets and repeat  in 12 hours.  Dispense: 90 tablet; Refill: 1  No orders of the defined types were placed in this encounter.   Meds:  Meds ordered this encounter  Medications   Estradiol  10 MCG TABS vaginal tablet    Sig: PLACE 1 INSERT VAGINALLY TWICE WEEKLY    Dispense:  24 tablet    Refill:  4   valACYclovir  (VALTREX ) 1000 MG tablet    Sig: With onset of symptoms take 2 tablets and repeat in 12 hours.    Dispense:  90 tablet    Refill:  1    Follow-up: No follow-ups on file.  Ronal GORMAN Pinal, MD 11/14/2023 9:28 AM

## 2023-11-21 ENCOUNTER — Ambulatory Visit

## 2023-11-21 VITALS — BP 130/70 | HR 75 | Temp 98.3°F | Ht 65.75 in | Wt 142.0 lb

## 2023-11-21 DIAGNOSIS — R002 Palpitations: Secondary | ICD-10-CM

## 2023-11-21 DIAGNOSIS — R748 Abnormal levels of other serum enzymes: Secondary | ICD-10-CM | POA: Diagnosis not present

## 2023-11-21 LAB — ALKALINE PHOSPHATASE: Alkaline Phosphatase: 97 U/L (ref 39–117)

## 2023-11-21 LAB — GAMMA GT: GGT: 12 U/L (ref 7–51)

## 2023-11-21 LAB — TSH: TSH: 1.93 u[IU]/mL (ref 0.35–5.50)

## 2023-11-21 NOTE — Patient Instructions (Addendum)
 Thank you for visiting [facility] today! Here's what we talked about: - Continue to abstain from caffeine - Look out for heart monitor in the mail

## 2023-11-21 NOTE — Progress Notes (Signed)
 Subjective:    Patient ID: Christine [name], female    DOB: [birth year], 73 y.o.   MRN: [id number]   Christine [name] is a very pleasant 73 y.o. female who presents today for fatigue and heart concerns. Has been using portable EKG monitor, showed instances of arrhythmia/tachcardia, swam for 30 mins got home later HR was 105, exertional SOB sometimes, everytime after she exercises she has palpitations, no chest pain, unsure if associated with dizzyness, feels heart fluttering, occurs multiple times daily,  Likes to challenge herself regarding exercise, doesn't go to some exercise classes because they are not hard enough, says she is actually now less intense with exercises than she was a year ago but heart sx are worse Cousin with heart attack in 56s Brother had first heart attack at 52 Dad had stent placed in his 77s    Review of Systems  All other systems reviewed and are negative.       Allergies  Allergen Reactions   Bactrim [Sulfamethoxazole-Trimethoprim] Itching   Dexamethasone  Other (See Comments)    Increased intraocular pressure   Lipitor [Atorvastatin] Other (See Comments)    headache   Monistat [Miconazole] Itching    burning    Current Outpatient Medications on File Prior to Visit  Medication Sig Dispense Refill   Cholecalciferol (VITAMIN D  PO) Take 10,000 Units by mouth daily.      estradiol  (ESTRACE ) 0.1 MG/GM vaginal cream 1 gm topically weekly as needed 1 g 0   Estradiol  10 MCG TABS vaginal tablet PLACE 1 INSERT VAGINALLY TWICE WEEKLY 24 tablet 4   MAGNESIUM PO Take 800 Units by mouth daily.      MELATONIN PO Take by mouth as needed. Reported on 08/13/2015     rizatriptan  (MAXALT ) 10 MG tablet Take 1 tablet (10 mg total) by mouth once as needed for migraine. May repeat in 2 hours if needed 30 tablet 2   spironolactone  (ALDACTONE ) 100 MG tablet Take 2 tablets (200 mg total) by mouth 2 (two) times daily. 1 tablet 0   valACYclovir  (VALTREX ) 1000 MG tablet With  onset of symptoms take 2 tablets and repeat in 12 hours. 90 tablet 1   No current facility-administered medications on file prior to visit.    BP 130/70 (BP Location: Left Arm, Patient Position: Sitting, Cuff Size: Normal)   Pulse 75   Temp 98.3 F (36.8 C)   Ht 5' 5.75 (1.67 m)   Wt 142 lb (64.4 kg)   LMP 12/05/2010   SpO2 99%   BMI 23.09 kg/m   Objective:    Physical Exam Vitals and nursing note reviewed.  Constitutional:      Appearance: Normal appearance.  HENT:     Head: Normocephalic and atraumatic.  Eyes:     Extraocular Movements: Extraocular movements intact.     Conjunctiva/sclera: Conjunctivae normal.  Cardiovascular:     Rate and Rhythm: Normal rate and regular rhythm.     Pulses: Normal pulses.     Heart sounds: No murmur heard. Pulmonary:     Effort: Pulmonary effort is normal.     Breath sounds: Normal breath sounds.  Skin:    General: Skin is warm.  Neurological:     Mental Status: She is alert.  Psychiatric:        Mood and Affect: Mood normal.        Behavior: Behavior normal.          Assessment & Plan:   1. Palpitation (Primary)  Patient presents with several months of nearly daily, intermittent episodes of palpitations/fluttering, sometimes associated with lightheadedness, but no other cardiac symptoms. There is no significant cardiac history per chart review from past providers.  Her personal EKG recorder does record instances of mild tachycardia, however this is expected following strenuous exercise, reported PVCs are only occasional, on my review of these reports, there is no A-fib or other specific arrhythmia.  EKG in the office revealed NSR, with HR of 69, no other abnormalities.  Differentials for patient symptoms include paroxysmal A-fib versus another paroxysmal SVT.  Will order event monitor as below, will order thyroid  labs as well to rule out that as a potential cause.  Counseled patient to continue to abstain from caffeine.  ED  precautions and warning signs which would prompt emergent evaluation were also given.  - EKG 12-Lead - TSH - LONG TERM MONITOR (3-14 DAYS); Future  2. Elevated alkaline phosphatase level Per chart review, alkaline phosphatase has been elevated both 1 year ago and at labs 7 months ago, most recently at 130, which is significant elevation.  Will repeat a fasting one today, as well as GGT to isolate liver versus bone cause, next steps in workup TBD based on results.  - Alkaline phosphatase - Gamma GT   Return in about 2 weeks (around 12/05/2023) for Lab results. Will need follow-up around 3 weeks after this for review of monitor results.  Shai Rasmussen K Tareva Leske, MD  11/21/23

## 2023-11-22 ENCOUNTER — Ambulatory Visit: Payer: Self-pay

## 2023-11-23 ENCOUNTER — Telehealth: Payer: Self-pay

## 2023-11-23 NOTE — Telephone Encounter (Signed)
 Copied from CRM [id number]. Topic: General - Other >> Nov 23, 2023  1:20 PM Thersia C wrote: Reason for CRM: Patient called in regarding the monitor stated she received it instruction stated it was 14 days but she was told 7 days would like a callback for clarification

## 2023-11-27 NOTE — Telephone Encounter (Signed)
 Pls call pt and instruct that although initial plan was for 7 days, 14 days of monitoring will give us  more data to work with, so I would like her to wear it for 14 days.

## 2023-11-27 NOTE — Telephone Encounter (Addendum)
 Spoke to pt. Moved her appt to 12-21-23 as she would not be done with monitor until 10-2.

## 2023-12-06 ENCOUNTER — Ambulatory Visit

## 2023-12-17 DIAGNOSIS — R002 Palpitations: Secondary | ICD-10-CM

## 2023-12-21 ENCOUNTER — Ambulatory Visit

## 2023-12-21 VITALS — BP 104/70 | HR 88 | Temp 98.2°F | Ht 65.75 in | Wt 140.0 lb

## 2023-12-21 DIAGNOSIS — R600 Localized edema: Secondary | ICD-10-CM | POA: Diagnosis not present

## 2023-12-21 DIAGNOSIS — I4729 Other ventricular tachycardia: Secondary | ICD-10-CM

## 2023-12-21 DIAGNOSIS — R002 Palpitations: Secondary | ICD-10-CM | POA: Diagnosis not present

## 2023-12-21 NOTE — Patient Instructions (Signed)
 Thank you for visiting [facility] today! Here's what we talked about: - Cardiology office will call you to schedule evaluation and echocardiogram

## 2023-12-21 NOTE — Addendum Note (Signed)
 Addended by: Joscelyn Hardrick K on: 12/21/2023 05:57 PM   Modules accepted: Orders

## 2023-12-21 NOTE — Progress Notes (Signed)
 Subjective:   This visit was conducted in person. The patient gave informed consent to the use of Abridge AI technology to record the contents of the encounter as documented below.   Patient ID: Christine [name], female    DOB: Oct 10, 1950, 73 y.o.   MRN: [id number]   Discussed the use of AI scribe software for clinical note transcription with the patient, who gave verbal consent to proceed.  History of Present Illness Christine [name] is a 73 year old female who presents for follow-up of Zio results.  Her symptoms occur frequently, even during minimal physical activity such as walking around the house or doing household chores, which previously did not trigger such symptoms. She experiences daily palpitations.  She is concerned about potential heart issues similar to her mother's history of congestive heart failure. Her mother had congestive heart failure, and her son experienced pericarditis following a dental procedure.  She sleeps with her bed slightly elevated to relieve back pressure. She can sleep flat if not sick and denies waking up at night feeling unable to breathe, though she wakes frequently to use the bathroom.   Review of Systems  All other systems reviewed and are negative.       Allergies  Allergen Reactions   Bactrim [Sulfamethoxazole-Trimethoprim] Itching   Dexamethasone  Other (See Comments)    Increased intraocular pressure   Lipitor [Atorvastatin] Other (See Comments)    headache   Monistat [Miconazole] Itching    burning    Current Outpatient Medications on File Prior to Visit  Medication Sig Dispense Refill   Cholecalciferol (VITAMIN D  PO) Take 10,000 Units by mouth daily.      estradiol  (ESTRACE ) 0.1 MG/GM vaginal cream 1 gm topically weekly as needed 1 g 0   Estradiol  10 MCG TABS vaginal tablet PLACE 1 INSERT VAGINALLY TWICE WEEKLY 24 tablet 4   MAGNESIUM PO Take 800 Units by mouth daily.      MELATONIN PO Take by mouth as needed. Reported on  08/13/2015     rizatriptan  (MAXALT ) 10 MG tablet Take 1 tablet (10 mg total) by mouth once as needed for migraine. May repeat in 2 hours if needed 30 tablet 2   Saw Palmetto, Serenoa repens, (SAW PALMETTO PO) Take by mouth.     spironolactone  (ALDACTONE ) 100 MG tablet Take 2 tablets (200 mg total) by mouth 2 (two) times daily. 1 tablet 0   valACYclovir  (VALTREX ) 1000 MG tablet With onset of symptoms take 2 tablets and repeat in 12 hours. 90 tablet 1   Zinc 50 MG TABS Take by mouth.     No current facility-administered medications on file prior to visit.    BP 104/70 (BP Location: Left Arm, Patient Position: Sitting, Cuff Size: Normal)   Pulse 88   Temp 98.2 F (36.8 C) (Oral)   Ht 5' 5.75 (1.67 m)   Wt 140 lb (63.5 kg)   LMP 12/05/2010   SpO2 98%   BMI 22.77 kg/m   Objective:      Physical Exam GENERAL: Alert, cooperative, well developed, no acute distress. CARDIOVASCULAR: Normal heart rate and rhythm, S1 and S2 normal without murmurs, heart normal on auscultation. CHEST: Clear to auscultation bilaterally, no wheezes, rhonchi, or crackles. EXTREMITIES: No cyanosis or edema, extremities without edema. Varicosities present. NEUROLOGICAL: Oriented to person, place and time, no gait abnormalities, moves all extremities without gross motor or sensory deficit.        Assessment & Plan:   Assessment & Plan  1. Nonsustained ventricular tachycardia (HCC) (Primary) 2. Palpitations Patient has had several months of daily palpitations, Zio monitor showed:  19 episodes of non-sustained SVT, the longest lasting 18 seconds at a heart rate of 117 bpm. 2 non-sustained VT episodes lasted about five beats each, trigger episodes corresponded to sinus with PVCs.  Given daily symptoms even with minimal activity, pt would benefit from trial of beta blocker, will send Metoprolol as below. Will order echo to r/o structural abnormalities and refer to cardiology for stress testing as both these issues  can predispose to or exacerbate arrhythmias.   - Order echocardiogram to assess heart structure. - Refer to cardiologist for further evaluation and potential stress test. - Make cardiology referral urgent due to daily symptoms. - Metoprolol 25mg  daily, pt counseled on Ses, will monitor BP and heart rate at home   3. Bilateral leg edema This has been a chronic intermittent issue, low suspicion for true HF at this time given lack of other associated sx, given the edema tends to increase over the course of the day and improve after elevation, most likely venous insufficiency. No edema on today's exam.  -Counseled to elevate legs daily, will escalate to daily compression socks if worsens   Return in about 4 months (around 04/09/2024) for [facility] wellness with health coach then CPE with me round same time.   Adalene Gulotta K Joenathan Sakuma, MD  12/21/23     Contains text generated by Abridge.

## 2023-12-28 ENCOUNTER — Ambulatory Visit: Attending: Cardiovascular Disease | Admitting: Cardiovascular Disease

## 2023-12-28 ENCOUNTER — Encounter: Payer: Self-pay | Admitting: Cardiovascular Disease

## 2023-12-28 VITALS — BP 141/70 | HR 84 | Ht 66.0 in | Wt 142.4 lb

## 2023-12-28 DIAGNOSIS — E042 Nontoxic multinodular goiter: Secondary | ICD-10-CM

## 2023-12-28 DIAGNOSIS — R0602 Shortness of breath: Secondary | ICD-10-CM

## 2023-12-28 DIAGNOSIS — I493 Ventricular premature depolarization: Secondary | ICD-10-CM

## 2023-12-28 DIAGNOSIS — R002 Palpitations: Secondary | ICD-10-CM

## 2023-12-28 DIAGNOSIS — R011 Cardiac murmur, unspecified: Secondary | ICD-10-CM

## 2023-12-28 DIAGNOSIS — E7800 Pure hypercholesterolemia, unspecified: Secondary | ICD-10-CM

## 2023-12-28 NOTE — Patient Instructions (Addendum)
 Medication Instructions:  No changes *If you need a refill on your cardiac medications before your next appointment, please call your pharmacy*  Lab Work: None ordered If you have labs (blood work) drawn today and your tests are completely normal, you will receive your results only by: MyChart Message (if you have MyChart) OR A paper copy in the mail If you have any lab test that is abnormal or we need to change your treatment, we will call you to review the results.  Testing/Procedures:   Patient Instructions for Treadmill Stress Test (Schedule after November 6th/Echo)  Medication instructions: Spironolactone  on the morning of the test  Do not eat, drink or use tobacco products four hours prior to the test.  Water is ok.  3.  Dress prepared to exercise in a comfortable, two piece clothing outfit and walking shoes.  4.  Bring any current prescription medications with you the day of the test.  5.  Notify the office 24 hours in advance if you cannot keep this appointment.  6.  If you have any questions, please call [phone].   Follow-Up: At [facility], you and your health needs are our priority.  As part of our continuing mission to provide you with exceptional heart care, our providers are all part of one team.  This team includes your primary Cardiologist (physician) and Advanced Practice Providers or APPs (Physician Assistants and Nurse Practitioners) who all work together to provide you with the care you need, when you need it.  Your next appointment:   Follow up according to test results  We recommend signing up for the patient portal called MyChart.  Sign up information is provided on this After Visit Summary.  MyChart is used to connect with patients for Virtual Visits (Telemedicine).  Patients are able to view lab/test results, encounter notes, upcoming appointments, etc.  Non-urgent messages can be sent to your provider as well.   To learn more about what you  can do with MyChart, go to [email or web address].

## 2023-12-30 DIAGNOSIS — E7800 Pure hypercholesterolemia, unspecified: Secondary | ICD-10-CM | POA: Insufficient documentation

## 2023-12-30 NOTE — Progress Notes (Signed)
 Cardiology Office Note:    Date:  12/30/2023   ID:  Christine [name], DOB February 22, 1951, MRN [id number]  PCP:  Bennett Reuben POUR, MD   [facility] Providers Cardiologist:  None     Referring MD: Bennett Reuben POUR, MD   Chief Complaint  Patient presents with   Consult  Christine [name] is a 73 y.o. female who is being seen today for the evaluation of palpitations at the request of Bowa, Nahosi K, MD.   History of Present Illness:    Christine [name] is a 73 year old female without significant known cardiac illness, but with a strong family history of early onset heart disease, who presents with palpitations due to frequent PVCs and fatigue.  She has experienced palpitations and fatigue for the past couple of years, with fatigue becoming more pronounced during activities like doing laundry or walking around the house. Her heart rate increases unexpectedly during these activities, and she also experiences shortness of breath, particularly when her heart rate rises, affecting her ability to sing.  She has had premature ventricular contractions (PVCs) identified during a rhythm monitor test. The monitor showed mostly sinus rhythm with two brief episodes of non-sustained ventricular tachycardia (VT) and nineteen episodes of non-sustained supraventricular tachycardia (SVT). PVCs accounted for approximately 1.3% of all recorded beats, with no atrial fibrillation detected. She associates her symptoms with these PVCs, noting that they are more frequent with caffeine intake and stress, despite having reduced caffeine consumption.  She has a significant family history of heart disease. Her brother has had multiple heart attacks, the first occurring at age 61, and her mother has congestive heart failure and atrial fibrillation. Her maternal grandmother had multiple strokes, and a cousin died young from heart problems.  She has a history of multinodular goiter and manages her thyroid  function with  natural supplements and diet. She notes a correlation between her thyroid  function and blood sugar levels, with her A1c rising when her TSH is elevated. She does not take thyroid  hormone supplements.  She expresses a general desire to manage her problems with natural interventions rather than medications.  Her cholesterol levels have been variable, with a history of elevated LDL cholesterol. She previously took statins, including Lipitor and Crestor , but discontinued them due to muscle-related side effects. Her cholesterol levels have ranged between 180 and 230 mg/dL over the years.  Her most recent LDL cholesterol liver is borderline at 132.  She is physically active, participating in swimming, walking her dog, and attending exercise classes. She has reduced caffeine intake due to its effect on her symptoms. No chest pain during exercise but increased breathlessness compared to a year or two ago.  Past Medical History:  Diagnosis Date   AC (acromioclavicular) joint bone spurs    Allergy    Anemia    Anxiety    Degenerative arthritis    GERD (gastroesophageal reflux disease)    Goiter    multi nodular    History of anemia    History of PCOS    Hyperlipidemia    Hypertension    Nevus of left eye    Nuclear cataract 06/13/2011   PCOS (polycystic ovarian syndrome)    Status post intraocular lens implant 06/13/2011    Past Surgical History:  Procedure Laterality Date   24 HOUR PH STUDY  04/16/2019   Procedure: 24 HOUR PH STUDY;  Surgeon: Shila Gustav GAILS, MD;  Location: [facility];  Service: Endoscopy;;   BUNIONECTOMY Bilateral 2006  Feet   CATARACT EXTRACTION Left    COLONOSCOPY     ESOPHAGEAL MANOMETRY N/A 04/16/2019   Procedure: ESOPHAGEAL MANOMETRY (EM) with 24 hour PH Impedence study;  Surgeon: Shila Gustav GAILS, MD;  Location: [facility];  Service: Endoscopy;  Laterality: N/A;   HERNIA REPAIR Right 1959   PH IMPEDANCE STUDY N/A 04/16/2019   Procedure: PH IMPEDANCE  STUDY;  Surgeon: Shila Gustav GAILS, MD;  Location: [facility];  Service: Endoscopy;  Laterality: N/A;   TOE SURGERY Left 2006   Bone Spur   TONSILLECTOMY AND ADENOIDECTOMY  1959   TUBAL LIGATION  1995    Current Medications: Current Meds  Medication Sig   ASHWAGANDHA PO Take 1,000 mg by mouth daily at 6 (six) AM.   Coenzyme Q10 (CO Q 10 PO) Take 300 mg by mouth daily at 6 (six) AM.   Estradiol  10 MCG TABS vaginal tablet PLACE 1 INSERT VAGINALLY TWICE WEEKLY   Lysine 1000 MG TABS Take 1,000 mg by mouth daily.   MAGNESIUM GLYCINATE PO Take 1,330 mg by mouth 2 (two) times daily.   MAGNESIUM PO Take 800 Units by mouth daily.  (Patient taking differently: Take by mouth daily.)   spironolactone  (ALDACTONE ) 100 MG tablet Take 2 tablets (200 mg total) by mouth 2 (two) times daily. (Patient taking differently: Take 100 mg by mouth daily.)   Vitamin D -Vitamin [name] (VITAMIN K2-VITAMIN D3 PO) Take 1 capsule by mouth daily at 6 (six) AM.     Allergies:   Bactrim [sulfamethoxazole-trimethoprim], Dexamethasone , Lipitor [atorvastatin], and Monistat [miconazole]   Family History: The patient's family history includes Atrial fibrillation in her mother; Cancer (age of onset: 74) in her brother; Congestive Heart Failure in her mother; Dementia in her paternal grandmother; Emphysema in her maternal grandfather; Heart attack (age of onset: 35) in her brother; Heart disease in her brother and father; Heart disease (age of onset: 4) in her mother; Hyperlipidemia in her brother and mother; Hypertension in her mother; Liver cancer in her father; Lung cancer in her father; Other in her daughter; Polycystic ovary syndrome in her daughter; Stroke in her paternal grandmother; Thyroid  disease in her daughter and mother. There is no history of Colon cancer, Esophageal cancer, Rectal cancer, or Stomach cancer.  ROS:   Please see the history of present illness.     All other systems reviewed and are  negative.  EKGs/Labs/Other Studies Reviewed:    The following studies were reviewed today: 14-day arrhythmia monitor      Recent Labs: 04/10/2023: ALT 13; BUN 17; Creatinine, Ser 0.83; Hemoglobin 14.4; Platelets 321.0; Potassium 4.6; Sodium 138 11/21/2023: TSH 1.93  Recent Lipid Panel    Component Value Date/Time   CHOL 227 (H) 03/16/2022 0842   TRIG 115.0 03/16/2022 0842   HDL 71.80 03/16/2022 0842   CHOLHDL 3 03/16/2022 0842   VLDL 23.0 03/16/2022 0842   LDLCALC 132 (H) 03/16/2022 0842   LDLCALC 131 (H) 03/14/2021 1449     Risk Assessment/Calculations:             Physical Exam:    VS:  BP (!) 141/70   Pulse 84   Ht 5' 6 (1.676 m)   Wt 142 lb 6.4 oz (64.6 kg)   LMP 12/05/2010   SpO2 99%   BMI 22.98 kg/m     Wt Readings from Last 3 Encounters:  12/28/23 142 lb 6.4 oz (64.6 kg)  12/21/23 140 lb (63.5 kg)  11/21/23 142 lb (64.4 kg)  GEN: Appears lean and fit, well nourished, well developed in no acute distress HEENT: Normal NECK: No JVD; No carotid bruits LYMPHATICS: No lymphadenopathy CARDIAC: RRR, normal 1st and 2nd heart sound, early peaking 1/6 aortic ejection murmur with limited rate each, no diastolic murmurs, rubs, gallops RESPIRATORY:  Clear to auscultation without rales, wheezing or rhonchi  ABDOMEN: Soft, non-tender, non-distended MUSCULOSKELETAL:  No edema; No deformity  SKIN: Warm and dry NEUROLOGIC:  Alert and oriented x 3 PSYCHIATRIC:  Normal affect   ASSESSMENT:    1. Palpitation   2. Shortness of breath    PLAN:    In order of problems listed above:  PVCs:  identified as the cause of palpitations. PVCs are common and often adrenaline-driven, exacerbated by factors such as caffeine, exhaustion, and stress. The arrhythmia monitor showed occasional PVCs (1.3% of all beats) and no atrial fibrillation. Symptoms triggered episodes corresponded to PVCs.  Check for structural heart issues with scheduled echocardiogram on November 6.  Have  also ordered a treadmill stress test if echocardiogram is normal to assess exercise capacity and potential coronary blockages.  However, if the echocardiogram shows clear-cut regional wall motion abnormalities or reduced left ventricular function, she will be better suited for angiographic evaluation rather than just a stress test. Fatigue and exertional shortness of breath: have been present for a couple of years, with increasing frequency. These symptoms may or may not be related to PVCs. The symptoms could be indicative of underlying coronary artery disease, especially given the family history of heart disease. Heart murmur, likely aortic valve sclerosis: - Review echocardiogram results for detailed assessment of heart murmur. Left anterior fascicular block: No immediate concerns raised by this finding. Hyperlipidemia and prediabetes: LDL cholesterol is slightly elevated at 132 mg/dL, which is upper limit of normal for individuals without heart disease. Family history of heart disease increases cardiovascular risk. Previous statin use resulted in muscle side effects, and she is not currently on cholesterol-lowering medication. Prediabetes noted in past labs, potentially related to thyroid  function. Consider non-statin cholesterol-lowering medications if needed after further assessment. Multinodular goiter: managed with natural supplements and diet. Thyroid  function affects A1c levels.  She reports that her A1c normalizes when her TSH is in normal range.  Informed Consent   Shared Decision Making/Informed Consent The risks [chest pain, shortness of breath, cardiac arrhythmias, dizziness, blood pressure fluctuations, myocardial infarction, stroke/transient ischemic attack, and life-threatening complications (estimated to be 1 in 10,000)], benefits (risk stratification, diagnosing coronary artery disease, treatment guidance) and alternatives of an exercise tolerance test were discussed in detail with Ms.  Aarons and she agrees to proceed.       Medication Adjustments/Labs and Tests Ordered: Current medicines are reviewed at length with the patient today.  Concerns regarding medicines are outlined above.  Orders Placed This Encounter  Procedures   Cardiac Stress Test: Informed Consent Details: Physician/Practitioner Attestation; Transcribe to consent form and obtain patient signature   EXERCISE TOLERANCE TEST (ETT)   No orders of the defined types were placed in this encounter.   Patient Instructions  Medication Instructions:  No changes *If you need a refill on your cardiac medications before your next appointment, please call your pharmacy*  Lab Work: None ordered If you have labs (blood work) drawn today and your tests are completely normal, you will receive your results only by: MyChart Message (if you have MyChart) OR A paper copy in the mail If you have any lab test that is abnormal or we need to change your  treatment, we will call you to review the results.  Testing/Procedures:   Patient Instructions for Treadmill Stress Test (Schedule after November 6th/Echo)  Medication instructions: Spironolactone  on the morning of the test  Do not eat, drink or use tobacco products four hours prior to the test.  Water is ok.  3.  Dress prepared to exercise in a comfortable, two piece clothing outfit and walking shoes.  4.  Bring any current prescription medications with you the day of the test.  5.  Notify the office 24 hours in advance if you cannot keep this appointment.  6.  If you have any questions, please call [phone].   Follow-Up: At [facility], you and your health needs are our priority.  As part of our continuing mission to provide you with exceptional heart care, our providers are all part of one team.  This team includes your primary Cardiologist (physician) and Advanced Practice Providers or APPs (Physician Assistants and Nurse Practitioners) who all  work together to provide you with the care you need, when you need it.  Your next appointment:   Follow up according to test results  We recommend signing up for the patient portal called MyChart.  Sign up information is provided on this After Visit Summary.  MyChart is used to connect with patients for Virtual Visits (Telemedicine).  Patients are able to view lab/test results, encounter notes, upcoming appointments, etc.  Non-urgent messages can be sent to your provider as well.   To learn more about what you can do with MyChart, go to [email or web address].      Signed, Jerel Balding, MD  12/30/2023 4:02 PM    [facility]

## 2024-01-10 ENCOUNTER — Ambulatory Visit (HOSPITAL_COMMUNITY)
Admission: RE | Admit: 2024-01-10 | Discharge: 2024-01-10 | Disposition: A | Discharge status: Home / Self Care | Source: Ambulatory Visit

## 2024-01-10 DIAGNOSIS — I071 Rheumatic tricuspid insufficiency: Secondary | ICD-10-CM | POA: Insufficient documentation

## 2024-01-10 DIAGNOSIS — R002 Palpitations: Secondary | ICD-10-CM | POA: Insufficient documentation

## 2024-01-10 DIAGNOSIS — I472 Ventricular tachycardia, unspecified: Secondary | ICD-10-CM | POA: Insufficient documentation

## 2024-01-10 DIAGNOSIS — I361 Nonrheumatic tricuspid (valve) insufficiency: Secondary | ICD-10-CM

## 2024-01-10 DIAGNOSIS — I4729 Other ventricular tachycardia: Secondary | ICD-10-CM

## 2024-01-10 LAB — ECHOCARDIOGRAM COMPLETE
Area-P 1/2: 3.6 cm2
S' Lateral: 2.2 cm

## 2024-01-12 ENCOUNTER — Ambulatory Visit: Payer: Self-pay

## 2024-01-22 ENCOUNTER — Encounter: Payer: Self-pay | Admitting: Cardiovascular Disease

## 2024-01-24 ENCOUNTER — Telehealth (HOSPITAL_COMMUNITY): Payer: Self-pay | Admitting: *Deleted

## 2024-01-24 NOTE — Telephone Encounter (Signed)
 Reminder call given for upcoming GXT on 01/28/24 at 3:30

## 2024-01-28 ENCOUNTER — Ambulatory Visit (HOSPITAL_COMMUNITY)
Admission: RE | Admit: 2024-01-28 | Discharge: 2024-01-28 | Disposition: A | Discharge status: Home / Self Care | Source: Ambulatory Visit | Attending: Family Medicine | Admitting: Family Medicine

## 2024-01-28 DIAGNOSIS — I493 Ventricular premature depolarization: Secondary | ICD-10-CM | POA: Diagnosis not present

## 2024-01-28 DIAGNOSIS — R0602 Shortness of breath: Secondary | ICD-10-CM | POA: Diagnosis present

## 2024-01-28 LAB — EXERCISE TOLERANCE TEST
Angina Index: 0
Base ST Depression (mm): 0 mm
Duke Treadmill Score: 5
Estimated workload: 6.4
Exercise duration (min): 4 min
Exercise duration (sec): 30 s
MPHR: 148 {beats}/min
Peak HR: 146 {beats}/min
Percent HR: 99 %
RPE: 19
Rest HR: 86 {beats}/min
ST Depression (mm): 0 mm

## 2024-01-29 ENCOUNTER — Ambulatory Visit: Payer: Self-pay | Admitting: Cardiovascular Disease

## 2024-01-29 NOTE — Telephone Encounter (Signed)
 Noted thank you

## 2024-04-22 ENCOUNTER — Encounter

## 2024-04-22 ENCOUNTER — Ambulatory Visit

## 2024-11-17 ENCOUNTER — Ambulatory Visit (HOSPITAL_BASED_OUTPATIENT_CLINIC_OR_DEPARTMENT_OTHER): Payer: Self-pay | Admitting: Obstetrics & Gynecology
